# Patient Record
Sex: Male | Born: 1955 | Race: Black or African American | Hispanic: No | Smoking: Current every day smoker
Health system: Southern US, Community
[De-identification: ages and names within clinical notes are randomized; demographics above are authoritative.]

## PROBLEM LIST (undated history)

## (undated) DIAGNOSIS — R079 Chest pain, unspecified: Secondary | ICD-10-CM

## (undated) DIAGNOSIS — F1220 Cannabis dependence, uncomplicated: Secondary | ICD-10-CM

## (undated) DIAGNOSIS — E785 Hyperlipidemia, unspecified: Secondary | ICD-10-CM

## (undated) DIAGNOSIS — E7800 Pure hypercholesterolemia, unspecified: Secondary | ICD-10-CM

## (undated) DIAGNOSIS — K219 Gastro-esophageal reflux disease without esophagitis: Secondary | ICD-10-CM

## (undated) DIAGNOSIS — M5410 Radiculopathy, site unspecified: Secondary | ICD-10-CM

## (undated) DIAGNOSIS — E559 Vitamin D deficiency, unspecified: Secondary | ICD-10-CM

## (undated) DIAGNOSIS — I10 Essential (primary) hypertension: Secondary | ICD-10-CM

## (undated) DIAGNOSIS — F419 Anxiety disorder, unspecified: Secondary | ICD-10-CM

## (undated) DIAGNOSIS — G4730 Sleep apnea, unspecified: Secondary | ICD-10-CM

## (undated) DIAGNOSIS — I700 Atherosclerosis of aorta: Secondary | ICD-10-CM

## (undated) DIAGNOSIS — J449 Chronic obstructive pulmonary disease, unspecified: Secondary | ICD-10-CM

## (undated) DIAGNOSIS — G8929 Other chronic pain: Secondary | ICD-10-CM

## (undated) DIAGNOSIS — S86011D Strain of right Achilles tendon, subsequent encounter: Secondary | ICD-10-CM

## (undated) DIAGNOSIS — K5792 Diverticulitis of intestine, part unspecified, without perforation or abscess without bleeding: Secondary | ICD-10-CM

## (undated) DIAGNOSIS — F32A Depression, unspecified: Secondary | ICD-10-CM

## (undated) DIAGNOSIS — F329 Major depressive disorder, single episode, unspecified: Secondary | ICD-10-CM

## (undated) DIAGNOSIS — R7303 Prediabetes: Secondary | ICD-10-CM

## (undated) DIAGNOSIS — G5601 Carpal tunnel syndrome, right upper limb: Secondary | ICD-10-CM

## (undated) HISTORY — DX: Pure hypercholesterolemia, unspecified: E78.00

## (undated) HISTORY — DX: Atherosclerosis of aorta: I70.0

## (undated) HISTORY — DX: Anxiety disorder, unspecified: F41.9

## (undated) HISTORY — PX: EYE SURGERY: SHX253

## (undated) HISTORY — DX: Carpal tunnel syndrome, right upper limb: G56.01

## (undated) HISTORY — PX: CARPAL TUNNEL RELEASE: SHX101

## (undated) HISTORY — DX: Radiculopathy, site unspecified: M54.10

## (undated) HISTORY — DX: Sleep apnea, unspecified: G47.30

## (undated) HISTORY — DX: Chronic obstructive pulmonary disease, unspecified: J44.9

## (undated) HISTORY — PX: HERNIA REPAIR: SHX51

## (undated) HISTORY — DX: Vitamin D deficiency, unspecified: E55.9

## (undated) HISTORY — DX: Strain of right Achilles tendon, subsequent encounter: S86.011D

## (undated) HISTORY — DX: Cannabis dependence, uncomplicated: F12.20

## (undated) HISTORY — DX: Prediabetes: R73.03

## (undated) HISTORY — DX: Other chronic pain: G89.29

---

## 2005-05-08 ENCOUNTER — Ambulatory Visit: Payer: Self-pay | Admitting: Nurse Practitioner

## 2005-05-15 ENCOUNTER — Ambulatory Visit: Payer: Self-pay | Admitting: *Deleted

## 2005-05-20 ENCOUNTER — Ambulatory Visit: Payer: Self-pay | Admitting: Nurse Practitioner

## 2005-07-25 ENCOUNTER — Emergency Department (HOSPITAL_COMMUNITY): Admission: EM | Admit: 2005-07-25 | Discharge: 2005-07-25 | Payer: Self-pay | Admitting: Emergency Medicine

## 2006-01-29 ENCOUNTER — Encounter (INDEPENDENT_AMBULATORY_CARE_PROVIDER_SITE_OTHER): Payer: Self-pay | Admitting: *Deleted

## 2006-01-29 ENCOUNTER — Emergency Department (HOSPITAL_COMMUNITY): Admission: EM | Admit: 2006-01-29 | Discharge: 2006-01-29 | Payer: Self-pay | Admitting: Emergency Medicine

## 2007-03-02 ENCOUNTER — Ambulatory Visit: Payer: Self-pay | Admitting: Internal Medicine

## 2007-03-02 ENCOUNTER — Encounter (INDEPENDENT_AMBULATORY_CARE_PROVIDER_SITE_OTHER): Payer: Self-pay | Admitting: Family Medicine

## 2007-03-02 LAB — CONVERTED CEMR LAB
ALT: 10 units/L (ref 0–53)
BUN: 12 mg/dL (ref 6–23)
CO2: 27 meq/L (ref 19–32)
Calcium: 9.2 mg/dL (ref 8.4–10.5)
Chlamydia, Swab/Urine, PCR: NEGATIVE
Chloride: 108 meq/L (ref 96–112)
Cholesterol: 224 mg/dL — ABNORMAL HIGH (ref 0–200)
Creatinine, Ser: 0.94 mg/dL (ref 0.40–1.50)
GC Probe Amp, Urine: NEGATIVE
HDL: 53 mg/dL (ref >39)
Hemoglobin: 13.3 g/dL (ref 13.0–17.0)
Lymphocytes Relative: 40 % (ref 12–46)
Monocytes Absolute: 0.6 10*3/uL (ref 0.1–1.0)
Monocytes Relative: 8 % (ref 3–12)
Neutro Abs: 3.6 10*3/uL (ref 1.7–7.7)
RBC: 4.46 M/uL (ref 4.22–5.81)
Testosterone: 271.72 ng/dL — ABNORMAL LOW (ref 350–890)
Total CHOL/HDL Ratio: 4.2

## 2007-03-10 ENCOUNTER — Ambulatory Visit: Payer: Self-pay | Admitting: Family Medicine

## 2007-05-05 ENCOUNTER — Ambulatory Visit: Payer: Self-pay | Admitting: Internal Medicine

## 2007-05-05 ENCOUNTER — Encounter (INDEPENDENT_AMBULATORY_CARE_PROVIDER_SITE_OTHER): Payer: Self-pay | Admitting: Nurse Practitioner

## 2007-05-05 LAB — CONVERTED CEMR LAB
Albumin: 4.5 g/dL (ref 3.5–5.2)
Alkaline Phosphatase: 52 units/L (ref 39–117)
Glucose, Bld: 87 mg/dL (ref 70–99)
LDL Cholesterol: 163 mg/dL — ABNORMAL HIGH (ref 0–99)
Potassium: 4.5 meq/L (ref 3.5–5.3)
Sodium: 140 meq/L (ref 135–145)
Testosterone: 237.24 ng/dL — ABNORMAL LOW (ref 350–890)
Total Protein: 7.1 g/dL (ref 6.0–8.3)
Triglycerides: 94 mg/dL (ref <150)

## 2007-06-08 ENCOUNTER — Ambulatory Visit: Payer: Self-pay | Admitting: Internal Medicine

## 2007-07-10 ENCOUNTER — Ambulatory Visit: Payer: Self-pay | Admitting: Internal Medicine

## 2007-08-10 ENCOUNTER — Ambulatory Visit: Payer: Self-pay | Admitting: Internal Medicine

## 2007-08-17 ENCOUNTER — Ambulatory Visit: Payer: Self-pay | Admitting: Internal Medicine

## 2007-09-08 ENCOUNTER — Ambulatory Visit: Payer: Self-pay | Admitting: Internal Medicine

## 2007-10-08 ENCOUNTER — Emergency Department (HOSPITAL_COMMUNITY): Admission: EM | Admit: 2007-10-08 | Discharge: 2007-10-08 | Payer: Self-pay | Admitting: Emergency Medicine

## 2008-02-11 ENCOUNTER — Ambulatory Visit: Payer: Self-pay | Admitting: Internal Medicine

## 2008-02-11 ENCOUNTER — Encounter (INDEPENDENT_AMBULATORY_CARE_PROVIDER_SITE_OTHER): Payer: Self-pay | Admitting: Internal Medicine

## 2008-02-11 LAB — CONVERTED CEMR LAB
Chlamydia, Swab/Urine, PCR: NEGATIVE
GC Probe Amp, Urine: NEGATIVE
HCT: 43.7 % (ref 39.0–52.0)
Lymphocytes Relative: 42 % (ref 12–46)
Lymphs Abs: 3.7 10*3/uL (ref 0.7–4.0)
Neutro Abs: 4 10*3/uL (ref 1.7–7.7)
Neutrophils Relative %: 46 % (ref 43–77)
Platelets: 221 10*3/uL (ref 150–400)
WBC: 8.7 10*3/uL (ref 4.0–10.5)

## 2008-03-09 ENCOUNTER — Encounter (INDEPENDENT_AMBULATORY_CARE_PROVIDER_SITE_OTHER): Payer: Self-pay | Admitting: Internal Medicine

## 2008-03-09 ENCOUNTER — Ambulatory Visit: Payer: Self-pay | Admitting: Internal Medicine

## 2008-03-09 LAB — CONVERTED CEMR LAB
ALT: 10 units/L (ref 0–53)
Albumin: 4.7 g/dL (ref 3.5–5.2)
Bilirubin Urine: NEGATIVE
CO2: 24 meq/L (ref 19–32)
Chloride: 107 meq/L (ref 96–112)
Cholesterol: 254 mg/dL — ABNORMAL HIGH (ref 0–200)
Glucose, Bld: 92 mg/dL (ref 70–99)
Leukocytes, UA: NEGATIVE
Potassium: 4.6 meq/L (ref 3.5–5.3)
Sodium: 139 meq/L (ref 135–145)
Specific Gravity, Urine: 1.017 (ref 1.005–1.03)
Total Bilirubin: 0.4 mg/dL (ref 0.3–1.2)
Total Protein: 7.7 g/dL (ref 6.0–8.3)
Triglycerides: 82 mg/dL (ref <150)
Urine Glucose: NEGATIVE mg/dL
Urobilinogen, UA: 1 (ref 0.0–1.0)

## 2008-03-14 ENCOUNTER — Ambulatory Visit (HOSPITAL_COMMUNITY): Admission: RE | Admit: 2008-03-14 | Discharge: 2008-03-14 | Payer: Self-pay | Admitting: Family Medicine

## 2008-03-23 ENCOUNTER — Ambulatory Visit: Payer: Self-pay | Admitting: Internal Medicine

## 2008-04-11 ENCOUNTER — Ambulatory Visit: Payer: Self-pay | Admitting: Internal Medicine

## 2008-04-18 ENCOUNTER — Ambulatory Visit: Payer: Self-pay

## 2008-04-18 ENCOUNTER — Encounter: Payer: Self-pay | Admitting: Internal Medicine

## 2008-04-21 ENCOUNTER — Ambulatory Visit: Payer: Self-pay | Admitting: Cardiology

## 2008-04-28 ENCOUNTER — Ambulatory Visit: Payer: Self-pay

## 2008-05-03 ENCOUNTER — Ambulatory Visit: Payer: Self-pay | Admitting: Cardiology

## 2008-05-04 ENCOUNTER — Ambulatory Visit: Payer: Self-pay | Admitting: Internal Medicine

## 2008-06-01 ENCOUNTER — Ambulatory Visit: Payer: Self-pay | Admitting: Family Medicine

## 2009-03-16 ENCOUNTER — Encounter (INDEPENDENT_AMBULATORY_CARE_PROVIDER_SITE_OTHER): Payer: Self-pay | Admitting: *Deleted

## 2009-04-14 ENCOUNTER — Ambulatory Visit: Payer: Self-pay | Admitting: Internal Medicine

## 2009-05-05 ENCOUNTER — Ambulatory Visit: Payer: Self-pay | Admitting: Internal Medicine

## 2009-05-05 LAB — CONVERTED CEMR LAB
Calcium: 9.4 mg/dL (ref 8.4–10.5)
Glucose, Bld: 104 mg/dL — ABNORMAL HIGH (ref 70–99)
Potassium: 4.1 meq/L (ref 3.5–5.3)
Sodium: 142 meq/L (ref 135–145)

## 2009-05-12 ENCOUNTER — Ambulatory Visit: Payer: Self-pay | Admitting: Internal Medicine

## 2009-06-15 ENCOUNTER — Ambulatory Visit: Payer: Self-pay | Admitting: Internal Medicine

## 2009-06-15 LAB — CONVERTED CEMR LAB
ALT: 13 units/L (ref 0–53)
Alkaline Phosphatase: 62 units/L (ref 39–117)
Basophils Absolute: 0 10*3/uL (ref 0.0–0.1)
Basophils Relative: 0 % (ref 0–1)
HCT: 40.8 % (ref 39.0–52.0)
Hemoglobin: 12.7 g/dL — ABNORMAL LOW (ref 13.0–17.0)
Hgb A1c MFr Bld: 5.9 % (ref 4.6–6.1)
LDL Cholesterol: 112 mg/dL — ABNORMAL HIGH (ref 0–99)
MCV: 90.3 fL (ref 78.0–100.0)
Neutro Abs: 3.5 10*3/uL (ref 1.7–7.7)
Neutrophils Relative %: 48 % (ref 43–77)
Platelets: 178 10*3/uL (ref 150–400)
Sodium: 143 meq/L (ref 135–145)
Total Bilirubin: 0.3 mg/dL (ref 0.3–1.2)
Total Protein: 6.5 g/dL (ref 6.0–8.3)
VLDL: 12 mg/dL (ref 0–40)
Valproic Acid Lvl: 108.9 ug/mL — ABNORMAL HIGH (ref 50.0–100.0)
WBC: 7.4 10*3/uL (ref 4.0–10.5)

## 2009-06-21 ENCOUNTER — Ambulatory Visit: Payer: Self-pay | Admitting: Internal Medicine

## 2009-08-15 ENCOUNTER — Ambulatory Visit: Payer: Self-pay | Admitting: Internal Medicine

## 2009-08-22 ENCOUNTER — Ambulatory Visit: Payer: Self-pay | Admitting: Internal Medicine

## 2009-09-06 ENCOUNTER — Ambulatory Visit: Payer: Self-pay | Admitting: Internal Medicine

## 2009-09-20 ENCOUNTER — Ambulatory Visit: Payer: Self-pay | Admitting: Internal Medicine

## 2009-10-04 ENCOUNTER — Ambulatory Visit: Payer: Self-pay | Admitting: Internal Medicine

## 2009-10-30 ENCOUNTER — Ambulatory Visit: Payer: Self-pay | Admitting: Internal Medicine

## 2010-02-23 ENCOUNTER — Emergency Department (HOSPITAL_COMMUNITY)
Admission: EM | Admit: 2010-02-23 | Discharge: 2010-02-24 | Payer: Self-pay | Source: Home / Self Care | Admitting: Emergency Medicine

## 2010-03-28 ENCOUNTER — Emergency Department (HOSPITAL_COMMUNITY)
Admission: EM | Admit: 2010-03-28 | Discharge: 2010-03-29 | Disposition: A | Discharge status: Other Acute Inpatient Hospital | Payer: Self-pay | Source: Home / Self Care | Admitting: Emergency Medicine

## 2010-03-29 ENCOUNTER — Inpatient Hospital Stay (HOSPITAL_COMMUNITY)
Admission: RE | Admit: 2010-03-29 | Discharge: 2010-04-05 | Payer: Self-pay | Source: Home / Self Care | Attending: Psychiatry | Admitting: Psychiatry
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
## 2010-04-01 ENCOUNTER — Encounter: Payer: Self-pay | Admitting: Internal Medicine

## 2010-04-02 LAB — DIFFERENTIAL
Lymphocytes Relative: 28 % (ref 12–46)
Monocytes Relative: 9 % (ref 3–12)

## 2010-04-02 LAB — CBC
Hemoglobin: 15.8 g/dL (ref 13.0–17.0)
MCH: 29.7 pg (ref 26.0–34.0)
Platelets: 219 10*3/uL (ref 150–400)
RBC: 5.32 MIL/uL (ref 4.22–5.81)
WBC: 9.8 10*3/uL (ref 4.0–10.5)

## 2010-04-02 LAB — POCT I-STAT, CHEM 8
BUN: 15 mg/dL (ref 6–23)
Calcium, Ion: 1.15 mmol/L (ref 1.12–1.32)
Creatinine, Ser: 1.1 mg/dL (ref 0.4–1.5)
TCO2: 34 mmol/L (ref 0–100)

## 2010-04-02 LAB — BASIC METABOLIC PANEL
BUN: 13 mg/dL (ref 6–23)
Creatinine, Ser: 1.18 mg/dL (ref 0.4–1.5)
GFR calc non Af Amer: >60 mL/min (ref >60)
Glucose, Bld: 95 mg/dL (ref 70–99)
Potassium: 2.8 mEq/L — ABNORMAL LOW (ref 3.5–5.1)

## 2010-04-02 LAB — RAPID URINE DRUG SCREEN, HOSP PERFORMED
Amphetamines: NOT DETECTED
Barbiturates: NOT DETECTED
Benzodiazepines: NOT DETECTED
Cocaine: NOT DETECTED
Opiates: NOT DETECTED
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
## 2010-04-11 NOTE — Letter (Signed)
Summary: Appointment - Reminder 2  [employer], Main Office  [address]   [place], [place] [address]   Phone: [phone]  Fax: [phone]     March 16, 2009 MRN: [id number]   MADELINE BEBOUT [address] [place], [place]  [address]   Dear Mr. Calzadilla,  Our records indicate that it is time to schedule a follow-up appointment with Dr. Shirlee Latch. It is very important that we reach you to schedule this appointment. We look forward to participating in your health care needs. Please contact us at the number listed above at your earliest convenience to schedule your appointment.  If you are unable to make an appointment at this time, give [place] a call so we can update our records.  Sincerely,   Migdalia Dk [facility] Scheduling Team

## 2010-05-10 ENCOUNTER — Emergency Department (HOSPITAL_COMMUNITY)
Admission: EM | Admit: 2010-05-10 | Discharge: 2010-05-10 | Disposition: A | Discharge status: Home / Self Care | Payer: Medicare Other | Attending: Emergency Medicine | Admitting: Emergency Medicine

## 2010-05-10 DIAGNOSIS — F319 Bipolar disorder, unspecified: Secondary | ICD-10-CM | POA: Insufficient documentation

## 2010-05-10 DIAGNOSIS — M542 Cervicalgia: Secondary | ICD-10-CM | POA: Insufficient documentation

## 2010-05-10 DIAGNOSIS — I10 Essential (primary) hypertension: Secondary | ICD-10-CM | POA: Insufficient documentation

## 2010-05-10 DIAGNOSIS — G8929 Other chronic pain: Secondary | ICD-10-CM | POA: Insufficient documentation

## 2010-05-10 DIAGNOSIS — E7800 Pure hypercholesterolemia, unspecified: Secondary | ICD-10-CM | POA: Insufficient documentation

## 2010-07-22 ENCOUNTER — Emergency Department (HOSPITAL_COMMUNITY): Payer: Medicare Other

## 2010-07-22 ENCOUNTER — Emergency Department (HOSPITAL_COMMUNITY)
Admission: EM | Admit: 2010-07-22 | Discharge: 2010-07-22 | Disposition: A | Discharge status: Home / Self Care | Payer: Medicare Other | Attending: Emergency Medicine | Admitting: Emergency Medicine

## 2010-07-22 DIAGNOSIS — R112 Nausea with vomiting, unspecified: Secondary | ICD-10-CM | POA: Insufficient documentation

## 2010-07-22 DIAGNOSIS — R197 Diarrhea, unspecified: Secondary | ICD-10-CM | POA: Insufficient documentation

## 2010-07-22 DIAGNOSIS — I10 Essential (primary) hypertension: Secondary | ICD-10-CM | POA: Insufficient documentation

## 2010-07-22 DIAGNOSIS — R109 Unspecified abdominal pain: Secondary | ICD-10-CM | POA: Insufficient documentation

## 2010-07-22 DIAGNOSIS — E7800 Pure hypercholesterolemia, unspecified: Secondary | ICD-10-CM | POA: Insufficient documentation

## 2010-07-22 DIAGNOSIS — F319 Bipolar disorder, unspecified: Secondary | ICD-10-CM | POA: Insufficient documentation

## 2010-07-22 LAB — COMPREHENSIVE METABOLIC PANEL
ALT: 9 U/L (ref 0–53)
AST: 13 U/L (ref 0–37)
Alkaline Phosphatase: 56 U/L (ref 39–117)
CO2: 29 mEq/L (ref 19–32)
Chloride: 100 mEq/L (ref 96–112)
Creatinine, Ser: 1.02 mg/dL (ref 0.4–1.5)
GFR calc Af Amer: >60 mL/min (ref >60)
GFR calc non Af Amer: >60 mL/min (ref >60)
Potassium: 3.4 mEq/L — ABNORMAL LOW (ref 3.5–5.1)
Total Bilirubin: 0.3 mg/dL (ref 0.3–1.2)

## 2010-07-22 LAB — DIFFERENTIAL
Basophils Relative: 0 % (ref 0–1)
Lymphs Abs: 2.5 10*3/uL (ref 0.7–4.0)
Monocytes Relative: 10 % (ref 3–12)
Neutro Abs: 5.5 10*3/uL (ref 1.7–7.7)
Neutrophils Relative %: 61 % (ref 43–77)

## 2010-07-22 LAB — CBC
Hemoglobin: 15 g/dL (ref 13.0–17.0)
MCH: 29.6 pg (ref 26.0–34.0)
RBC: 5.07 MIL/uL (ref 4.22–5.81)
WBC: 9 10*3/uL (ref 4.0–10.5)

## 2010-07-24 NOTE — Op Note (Signed)
NAME:  Paul Brady, ZIOMEK NO.:  [id number]   MEDICAL RECORD NO.:  [id number]          PATIENT TYPE:  EMS   LOCATION:  ED                           FACILITY:  [facility]   PHYSICIAN:  Lucrezia Starch. Earlene Plater, M.D.  DATE OF BIRTH:  1955/09/10   DATE OF PROCEDURE:  10/08/2007  DATE OF DISCHARGE:                               OPERATIVE REPORT   REPORT TITLE:  OPERATIVE REPORT/CONSULT   HISTORY OF PRESENT ILLNESS:  Ms. Utz is a very nice 55 year old black  male who presents with an 8-hour history of persistent erection.  He has  been quite healthy, but he takes trazodone to sleep and last night he  took 2 trazodone tablets.  He woke up this morning with an erection and  has the erection being very firm, painful and persistent.  He has been  able to urinate, but it has persisted despite using ice packs and then  eventually IV terbutaline.  He comes in for evaluation and treatment.   PAST MEDICAL HISTORY:  He has been quite healthy.  He does have anxiety.   MEDICATIONS:  1. Trazodone 100 mg at bedtime, which he doubled last night.  2. Zoloft 100 mg.  3. Depakote 1500 mg once a day.   ALLERGIES:  HE HAS NO KNOWN ALLERGIES.   PAST SURGICAL HISTORY:  He has had no significant surgeries.   SOCIAL HISTORY:  He is a negative drinker, but he is a smoker.  Of note,  he does have bipolar disorder.   PHYSICAL EXAMINATION:  VITAL SIGNS:  He is afebrile, vital signs stable.  GENERAL:  Well-nourished, well-developed in acute distress.  Oriented  x3.  HEAD/EYES/EARS/NOSE/THROAT:  Normal.  NECK:  Without mass or thyromegaly.  CHEST:  Has normal diaphragmatic motion.  HEART:  Tachycardia without murmurs or gallops.  ABDOMEN:  Soft, nontender without masses, organomegaly or hernias.  EXTREMITIES:  Normal.  NEURO:  Intact.  SKIN:  Normal.  PENIS:  Penis is very firm and erect.  Scrotum, testicles, adnexa, anus  and perineum were normal.   PROCEDURE:  The penis was prepped with Betadine  in a sterile fashion and  a 19 gauge butterfly was placed.  Approximately 40 mL of old frank  shaft appearing bloody material was removed and 1 mL of 100 mcg/mL  phenylephrine was irrigated and flushed with normal saline.  Again,  approximately 30 mL of frank shaft type bloody material was aspirated  and a second, third, fourth and fifth cycle of phenylephrine with saline  flush was performed.  The erection appeared to go down.  He  physiologically was stable.  Pressure was held on the puncture wound  after  the butterfly had been removed, and the wound was dressed with a 4x4  gauze over the puncture site and a Coban mild pressure dressing.  We  will observe him for an hour in the emergency room.  If he is down, he  will go home.  Specific instructions were given.  He is to follow-up  with [place] in a week, and call if he has problems in the interim.  Ronald L. Earlene Plater, M.D.  Electronically Signed     RLD/MEDQ  D:  10/08/2007  T:  10/08/2007  Job:  [id number]

## 2010-07-24 NOTE — Assessment & Plan Note (Signed)
[facility]                            CARDIOLOGY OFFICE NOTE   Paul Brady, Paul Brady                       MRN:          [id number]  DATE:05/03/2008                            DOB:          October 13, 1955    PRIMARY CARE PHYSICIAN:  Dineen Kid. Reche Dixon, MD, at [facility].   HISTORY OF PRESENT ILLNESS:  This is a 55 year old with a history of  hyperlipidemia, hypertension, smoking, and bipolar disorder, who comes  back to Cardiology Clinic after a evaluation for atypical-type chest  pain.  The patient did have an exercise treadmill Myoview.  He stopped  his exercise due to leg pain.  There was no chest pain.  His EF was 54%.  There was normal perfusion on the Myoview scan.  His study actually was  submaximal.  He did only reach 77% of his maximum-predicted heart rate.  He did exercise for 7 minutes.  The patient states that he has actually  been doing fine lately.  He continues to have somewhat atypical chest  pain and is more of lower chest and upper abdominal-type pain.  He  thinks it feels more like gas.  He does suspect it may be GI in nature.   PAST MEDICAL HISTORY:  1. Bipolar disorder.  2. History of priapism.  3. Hyperlipidemia.  4. Hypertension.  5. Smoking.  The patient has now cut down about 3 cigarettes a day.  6. Echocardiogram done in February 2010, EF 55-60%.  No regional wall      motion abnormality.  No LVH.  There is mild focal basal septal      hypertrophy, RV appeared normal, and no significant valvular      abnormalities.  7. Exercise treadmill Myoview in February 2010.  Exercise was stopped      due to pain in his legs, and there is no chest pain.  He exercises      7 minutes.  He is 77% of maximal-predicted heart rate.  His EF was      54%.  There was normal perfusion stress and rest.  However, this is      a submaximal study.   MEDICATIONS:  1. Depakote ER 1500 mg daily.  2. Wellbutrin.  3. Lipitor 20 mg daily.  4. Protonix 40  mg daily.  5. Hydrochlorothiazide 25 mg daily.  6. Seroquel.  7. Zoloft.   LABORATORY STUDIES:  Most recent labs from December 2009, LDL 77, HDL  61, and creatinine 0.8.   SOCIAL HISTORY:  The patient now smokes about 3 cigarettes a day.  He  was a heavier smoker in the past.  He lives alone, unemployed.   FAMILY HISTORY:  The patient's mother had 2 heart attacks in her 85s.  He had a brother who had a heart attack in his mid 33s.   PHYSICAL EXAMINATION:  VITAL SIGNS:  Blood pressure is 118/80, heart  rate is 80 and regular.  GENERAL:  This is a well-developed male in no apparent stress.  LUNGS:  Clear to auscultation bilaterally.  Breath sounds are somewhat  distant.  NEUROLOGIC:  Alert  and oriented x3.  Normal affect.  NECK:  There is no JVD.  There is no thyromegaly or thyroid nodule.  CARDIOVASCULAR:  Heart regular S1 and S2.  There is a soft S4.  There is  no murmur.  EXTREMITIES:  There is no peripheral edema.  There are 2+ posterior  tibial pulses bilaterally and 2+ dorsalis pedis pulses bilaterally.  ABDOMEN:  Soft and nontender.  No hepatosplenomegaly.  Normal bowel  sounds.   ASSESSMENT AND PLAN:  This is a 55 year old with history of  hypertension, hyperlipidemia, and smoking, who presents to Cardiology  Clinic for evaluation of atypical chest pain.  1. Chest pain.  The patient's chest pain is atypical and he now states      that this seems like it is more of a gas-type pain, often happens      after he has eaten.  It is in his upper abdomen.  It is tending to      ease off compared to back earlier this month.  He had a treadmill      Myoview with no evidence for ischemia; however, it was a submaximal      test.  Echocardiogram was relatively normal as well.  At this time,      I think the plan will be to continue aggressive management of his      risk factors.  He does need to quit smoking.  He is cutting back,      which I congratulated him on.  His LDL was 177 and  he is on Lipitor      20.  I do think he is going to need more statin to get the LDL      down.  I would like him probably to be down below 100 given all his      risk factors and therefore, I am going to increase his Lipitor to      40 mg daily today and he will need to get lipids and LFTs done in 3      months.  He also should continue on his aspirin 81 mg daily.  2. Hypertension.  The patient's blood pressure is under good control      on the current dose of hydrochlorothiazide.  3. Hyperlipidemia.  As mentioned, we are going to increase his Lipitor      to 40 mg daily.  He will get his cholesterol checked in 3 months.  4. Smoking.  I once again encouraged him to stop the smoking      completely.  He is making progress.  He has cut down.  He is on      Wellbutrin.  5. I will see the patient in followup in 1 year.      Marca Ancona, MD  Electronically Signed    DM/MedQ  DD: 05/03/2008  DT: 05/04/2008  Job #: [id number]   cc:   Dineen Kid. Reche Dixon, M.D.

## 2010-07-24 NOTE — Assessment & Plan Note (Signed)
[facility]                            CARDIOLOGY OFFICE NOTE   Brady, Paul                       MRN:          [id number]  DATE:04/21/2008                            DOB:          [birth year]    PRIMARY CARE PHYSICIAN:  Dineen Kid. Reche Dixon, MD, at [facility].   HISTORY OF PRESENT ILLNESS:  This is a 55 year old with history of  hyperlipidemia, hypertension, smoking and bipolar disorder who presents  to Cardiology Clinic for evaluation of abnormal EKG and chest  discomfort.  The patient states that for the last approximately 2  months, he has been having episodes of central chest pressure.  He says  the chest pressure tends to be quite significant.  It will come on and  last for about 5 minutes, then will resolve spontaneously.  He gets the  pressure both at rest and with exertion.  It occurs up to 3 times a day.  He says he sometimes notices it when he is walking.  He says the pain  will tend to resolve without him actually stopping.  The patient has had  no prolonged episode lasting longer than 5 minutes.  Otherwise, the  patient describes shortness of breath after climbing two flights of  stairs or after walking up an incline.  He does okay on flat ground.  He  does continue to smoke about half-a-pack a day.   PAST MEDICAL HISTORY:  1. Bipolar disorder.  2. Prior episode of priapism.  3. Hyperlipidemia.  4. Hypertension.  5. Smoking.  The patient does smoke half-a-pack a day.  6. Echocardiogram done in February 2010.  This showed EF of 55-60%.      No regional wall motion abnormalities.  No LVH.  There is mild      focal basal septal hypertrophy.  The RV appeared normal.  There are      no significant valvular abnormalities.   MEDICATIONS:  1. Depakote ER 1500 mg daily.  2. Wellbutrin.  3. Lipitor 20 mg daily.  4. Protonix 40 mg daily.  5. Hydrochlorothiazide 25 mg daily.  6. Seroquel.  7. Zoloft.   EKG was reviewed and shows  normal sinus rhythm with repolarization  changes that may be consistent with early repolarization.  There is  borderline LVH.  Normal QRS duration.   SOCIAL HISTORY:  The patient smokes half-a-pack a day.  He is a heavy  smoker in the past.  He lives alone.  He is unemployed.   FAMILY HISTORY:  The patient's mother had heart problems.  He says he  think she had 2 heart attacks in her 75s.  He additionally has a  brother, who had a heart attack in his mid 60s.   REVIEW OF SYSTEMS:  Negative except as described in the history of  present illness.   PHYSICAL EXAMINATION:  VITAL SIGNS:  Blood pressure 124/96, heart rate  is 65 and regular, and weight is 244 pounds.  GENERAL:  This is a well-developed male in no apparent distress.  NEUROLOGIC:  Alert and oriented x3.  Normal affect.  LUNGS:  Clear  to auscultation bilaterally.  Breath sounds are distant.  NECK:  There is no JVD.  There is no thyromegaly or thyroid nodule.  CARDIOVASCULAR:  Heart regular.  S1 and S2.  There is an S4.  There is  no murmur.  ABDOMEN:  Soft and nontender.  No hepatosplenomegaly.  Normal bowel  sounds.  EXTREMITIES:  No clubbing or cyanosis.  There is no peripheral edema.  There are 2+ posterior tibial and dorsalis pedis pulses bilaterally.  There is no carotid bruit.  SKIN:  Normal exam.  HEENT:  Normal exam.  MUSCULOSKELETAL:  Normal exam.   ASSESSMENT AND PLAN:  This is a 55 year old with a history of  hypertension, hyperlipidemia, and smoking who presents to Cardiology  Clinic for evaluation of abnormal EKG and chest pain.  1. Chest pain.  The patient's chest pain pattern is atypical.  It does      sometimes occur with exertion, but it also sometimes occurs at      rest.  It is central and substernal.  He does have multiple risk      factors for coronary artery disease including hyperlipidemia,      smoking, and hypertension.  He additionally does have a family      history of coronary artery disease.   His EKG showed some early      repolarization and borderline LVH.  Although his pain is atypical,      he does have a number of risk factors and I do think that it would      be safest to do a stress test.  Unfortunately, as his baseline EKG      has repolarization abnormalities, I do not think that an exercise      treadmill test alone is the best option.  I do think that we      probably should do a treadmill Myoview.  The patient would have to      pay out of pocket for this and does not have the funds, so we are      going to explore avenues to help subsidize this for him.  He also      should start aspirin 81 mg daily.  2. Hypertension.  The patient's blood pressure is 124/96 today and the      lower number is a little bit elevated.  He is on      hydrochlorothiazide 25 mg daily.  Continue him on this dose.  If it      continues to be elevated next time I will see him, I will probably      add an additional medication.  3. Hyperlipidemia.  The patient is on Lipitor.  We will get his      cholesterol results from [facility].  4. Smoking.  I did strongly encouraged the patient to stop smoking.      He is on Wellbutrin currently.     Marca Ancona, MD  Electronically Signed   DM/MedQ  DD: 04/21/2008  DT: 04/21/2008  Job #: [id number]   cc:   Dineen Kid. Reche Dixon, M.D.

## 2012-01-10 ENCOUNTER — Emergency Department (HOSPITAL_COMMUNITY)
Admission: EM | Admit: 2012-01-10 | Discharge: 2012-01-10 | Disposition: A | Discharge status: Home / Self Care | Payer: Medicare Other | Attending: Emergency Medicine | Admitting: Emergency Medicine

## 2012-01-10 ENCOUNTER — Emergency Department (HOSPITAL_COMMUNITY): Payer: Medicare Other

## 2012-01-10 ENCOUNTER — Encounter (HOSPITAL_COMMUNITY): Payer: Self-pay | Admitting: Family Medicine

## 2012-01-10 DIAGNOSIS — S6990XA Unspecified injury of unspecified wrist, hand and finger(s), initial encounter: Secondary | ICD-10-CM | POA: Insufficient documentation

## 2012-01-10 DIAGNOSIS — F329 Major depressive disorder, single episode, unspecified: Secondary | ICD-10-CM | POA: Insufficient documentation

## 2012-01-10 DIAGNOSIS — T07XXXA Unspecified multiple injuries, initial encounter: Secondary | ICD-10-CM | POA: Insufficient documentation

## 2012-01-10 DIAGNOSIS — Z79899 Other long term (current) drug therapy: Secondary | ICD-10-CM | POA: Insufficient documentation

## 2012-01-10 DIAGNOSIS — F17200 Nicotine dependence, unspecified, uncomplicated: Secondary | ICD-10-CM | POA: Insufficient documentation

## 2012-01-10 DIAGNOSIS — F3289 Other specified depressive episodes: Secondary | ICD-10-CM | POA: Insufficient documentation

## 2012-01-10 HISTORY — DX: Depression, unspecified: F32.A

## 2012-01-10 HISTORY — DX: Major depressive disorder, single episode, unspecified: F32.9

## 2012-01-10 MED ORDER — OXYCODONE-ACETAMINOPHEN 5-325 MG PO TABS: 1.0000 | ORAL_TABLET | ORAL | Status: DC | PRN

## 2012-01-10 MED ORDER — OXYCODONE-ACETAMINOPHEN 5-325 MG PO TABS
2.0000 | ORAL_TABLET | Freq: Once | ORAL | Status: AC
  Administered 2012-01-10: 2 via ORAL
  Filled 2012-01-10: qty 2

## 2012-01-10 NOTE — ED Notes (Signed)
[employer] notified per pt. Request.
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
## 2012-02-03 ENCOUNTER — Ambulatory Visit: Payer: Medicare Other | Admitting: Physical Therapy

## 2012-02-11 ENCOUNTER — Ambulatory Visit: Payer: Medicare Other | Attending: Internal Medicine

## 2012-02-11 DIAGNOSIS — IMO0001 Reserved for inherently not codable concepts without codable children: Secondary | ICD-10-CM | POA: Insufficient documentation

## 2012-02-11 DIAGNOSIS — M545 Low back pain, unspecified: Secondary | ICD-10-CM | POA: Insufficient documentation

## 2012-02-11 DIAGNOSIS — M25569 Pain in unspecified knee: Secondary | ICD-10-CM | POA: Insufficient documentation

## 2012-02-17 ENCOUNTER — Encounter: Payer: Medicare Other | Admitting: Rehabilitation

## 2012-02-19 ENCOUNTER — Encounter: Payer: Medicare Other | Admitting: Rehabilitation

## 2012-02-24 ENCOUNTER — Ambulatory Visit: Payer: Medicare Other | Admitting: Rehabilitation

## 2012-02-26 ENCOUNTER — Ambulatory Visit: Payer: Medicare Other | Admitting: Physical Therapy

## 2012-03-10 ENCOUNTER — Encounter: Payer: Medicare Other | Admitting: Physical Therapy

## 2012-04-11 HISTORY — PX: WRIST FUSION: SHX839

## 2012-08-04 ENCOUNTER — Emergency Department (HOSPITAL_COMMUNITY): Payer: Medicare Other

## 2012-08-04 ENCOUNTER — Encounter (HOSPITAL_COMMUNITY): Payer: Self-pay | Admitting: General Practice

## 2012-08-04 ENCOUNTER — Observation Stay (HOSPITAL_COMMUNITY)
Admission: EM | Admit: 2012-08-04 | Discharge: 2012-08-05 | Disposition: A | Discharge status: Home / Self Care | Payer: Medicare Other | Attending: Family Medicine | Admitting: Family Medicine

## 2012-08-04 DIAGNOSIS — E785 Hyperlipidemia, unspecified: Secondary | ICD-10-CM | POA: Diagnosis present

## 2012-08-04 DIAGNOSIS — Z79899 Other long term (current) drug therapy: Secondary | ICD-10-CM | POA: Insufficient documentation

## 2012-08-04 DIAGNOSIS — R0602 Shortness of breath: Secondary | ICD-10-CM | POA: Insufficient documentation

## 2012-08-04 DIAGNOSIS — Z23 Encounter for immunization: Secondary | ICD-10-CM | POA: Insufficient documentation

## 2012-08-04 DIAGNOSIS — F17200 Nicotine dependence, unspecified, uncomplicated: Secondary | ICD-10-CM | POA: Insufficient documentation

## 2012-08-04 DIAGNOSIS — R079 Chest pain, unspecified: Principal | ICD-10-CM | POA: Diagnosis present

## 2012-08-04 DIAGNOSIS — Z8249 Family history of ischemic heart disease and other diseases of the circulatory system: Secondary | ICD-10-CM | POA: Insufficient documentation

## 2012-08-04 DIAGNOSIS — I10 Essential (primary) hypertension: Secondary | ICD-10-CM | POA: Diagnosis present

## 2012-08-04 DIAGNOSIS — Z720 Tobacco use: Secondary | ICD-10-CM | POA: Diagnosis present

## 2012-08-04 HISTORY — DX: Gastro-esophageal reflux disease without esophagitis: K21.9

## 2012-08-04 HISTORY — DX: Essential (primary) hypertension: I10

## 2012-08-04 HISTORY — DX: Chest pain, unspecified: R07.9

## 2012-08-04 HISTORY — DX: Hyperlipidemia, unspecified: E78.5

## 2012-08-04 LAB — COMPREHENSIVE METABOLIC PANEL
ALT: 11 U/L (ref 0–53)
Albumin: 4.2 g/dL (ref 3.5–5.2)
Alkaline Phosphatase: 78 U/L (ref 39–117)
Calcium: 9.5 mg/dL (ref 8.4–10.5)
Potassium: 3.8 mEq/L (ref 3.5–5.1)
Sodium: 139 mEq/L (ref 135–145)
Total Protein: 7.1 g/dL (ref 6.0–8.3)

## 2012-08-04 LAB — CBC WITH DIFFERENTIAL/PLATELET
Basophils Relative: 0 % (ref 0–1)
Eosinophils Absolute: 0.1 10*3/uL (ref 0.0–0.7)
Eosinophils Relative: 1 % (ref 0–5)
MCH: 29.4 pg (ref 26.0–34.0)
MCHC: 35.5 g/dL (ref 30.0–36.0)
MCV: 82.8 fL (ref 78.0–100.0)
Neutrophils Relative %: 65 % (ref 43–77)
Platelets: 216 10*3/uL (ref 150–400)
RDW: 14.2 % (ref 11.5–15.5)

## 2012-08-04 LAB — CBC
HCT: 41.4 % (ref 39.0–52.0)
Hemoglobin: 14.6 g/dL (ref 13.0–17.0)
MCH: 29.2 pg (ref 26.0–34.0)
MCHC: 35.3 g/dL (ref 30.0–36.0)

## 2012-08-04 LAB — HEMOGLOBIN A1C
Hgb A1c MFr Bld: 5.3 % (ref <5.7)
Mean Plasma Glucose: 105 mg/dL (ref <117)

## 2012-08-04 MED ORDER — ENOXAPARIN SODIUM 40 MG/0.4ML ~~LOC~~ SOLN
40.0000 mg | SUBCUTANEOUS | Status: DC
  Administered 2012-08-04: 40 mg via SUBCUTANEOUS
  Filled 2012-08-04 (×2): qty 0.4

## 2012-08-04 MED ORDER — ASPIRIN EC 81 MG PO TBEC
81.0000 mg | DELAYED_RELEASE_TABLET | Freq: Every day | ORAL | Status: DC
  Administered 2012-08-05: 81 mg via ORAL
  Filled 2012-08-04 (×2): qty 1

## 2012-08-04 MED ORDER — METOPROLOL TARTRATE 12.5 MG HALF TABLET
12.5000 mg | ORAL_TABLET | Freq: Two times a day (BID) | ORAL | Status: DC
  Administered 2012-08-04 – 2012-08-05 (×2): 12.5 mg via ORAL
  Filled 2012-08-04 (×3): qty 1

## 2012-08-04 MED ORDER — SODIUM CHLORIDE 0.9 % IJ SOLN
3.0000 mL | Freq: Two times a day (BID) | INTRAMUSCULAR | Status: DC
  Administered 2012-08-04: 3 mL via INTRAVENOUS

## 2012-08-04 MED ORDER — ONDANSETRON HCL 4 MG/2ML IJ SOLN: 4.0000 mg | Freq: Four times a day (QID) | INTRAMUSCULAR | Status: DC | PRN

## 2012-08-04 MED ORDER — NICOTINE 21 MG/24HR TD PT24
21.0000 mg | MEDICATED_PATCH | Freq: Once | TRANSDERMAL | Status: DC
  Administered 2012-08-04: 21 mg via TRANSDERMAL
  Filled 2012-08-04: qty 1

## 2012-08-04 MED ORDER — SENNOSIDES-DOCUSATE SODIUM 8.6-50 MG PO TABS
1.0000 | ORAL_TABLET | Freq: Every evening | ORAL | Status: DC | PRN
  Filled 2012-08-04: qty 1

## 2012-08-04 MED ORDER — LISINOPRIL 5 MG PO TABS
5.0000 mg | ORAL_TABLET | Freq: Every day | ORAL | Status: DC
  Administered 2012-08-04 – 2012-08-05 (×2): 5 mg via ORAL
  Filled 2012-08-04 (×2): qty 1

## 2012-08-04 MED ORDER — ZOLPIDEM TARTRATE 5 MG PO TABS
5.0000 mg | ORAL_TABLET | Freq: Once | ORAL | Status: AC
  Administered 2012-08-04: 5 mg via ORAL
  Filled 2012-08-04: qty 1

## 2012-08-04 MED ORDER — ACETAMINOPHEN 650 MG RE SUPP: 650.0000 mg | Freq: Four times a day (QID) | RECTAL | Status: DC | PRN

## 2012-08-04 MED ORDER — OXYCODONE HCL 5 MG PO TABS: 5.0000 mg | ORAL_TABLET | ORAL | Status: DC | PRN

## 2012-08-04 MED ORDER — ACETAMINOPHEN 325 MG PO TABS
650.0000 mg | ORAL_TABLET | Freq: Four times a day (QID) | ORAL | Status: DC | PRN
  Administered 2012-08-04: 650 mg via ORAL
  Filled 2012-08-04: qty 2

## 2012-08-04 MED ORDER — HYDROMORPHONE HCL PF 1 MG/ML IJ SOLN: 1.0000 mg | INTRAMUSCULAR | Status: DC | PRN

## 2012-08-04 MED ORDER — PNEUMOCOCCAL VAC POLYVALENT 25 MCG/0.5ML IJ INJ
0.5000 mL | INJECTION | INTRAMUSCULAR | Status: AC
  Administered 2012-08-05: 0.5 mL via INTRAMUSCULAR
  Filled 2012-08-04: qty 0.5

## 2012-08-04 MED ORDER — ONDANSETRON HCL 4 MG PO TABS: 4.0000 mg | ORAL_TABLET | Freq: Four times a day (QID) | ORAL | Status: DC | PRN

## 2012-08-04 MED ORDER — ONDANSETRON HCL 4 MG/2ML IJ SOLN: 4.0000 mg | Freq: Three times a day (TID) | INTRAMUSCULAR | Status: DC | PRN

## 2012-08-04 NOTE — ED Notes (Signed)
Chest pain began last night about 9 PM. States that it felt like heart burn and was intermittent. Today the pain became sharp with a 15 to 30 sec. Duration. Pain was non radiating.

## 2012-08-04 NOTE — ED Provider Notes (Signed)
History     CSN: [id number]  Arrival date & time 08/04/12  1244   First MD Initiated Contact with Patient 08/04/12 1251      Chief Complaint  Patient presents with  . Chest Pain    (Consider location/radiation/quality/duration/timing/severity/associated sxs/prior treatment) HPI Comments: Patient with no cardiac history presents with complaints of chest discomfort intermittently since last night.  He reports multiple episodes of pressure-like discomfort in the center of his chest that causes him to feel short of breath.  It seems to happen with both exertion and rest.    He does have the risk factors of HTN, Cholesterol, CAD in both parents, and smoking.    Patient is a 57 y.o. male presenting with chest pain. The history is provided by the patient.  Chest Pain Pain location:  Substernal area Pain quality: burning and pressure   Pain radiates to:  Does not radiate Pain radiates to the back: no   Pain severity:  Moderate Onset quality:  Sudden Duration:  12 hours Timing:  Intermittent Progression:  Worsening Chronicity:  New Context: at rest   Relieved by:  Nothing Worsened by:  Nothing tried Ineffective treatments:  None tried Associated symptoms: no abdominal pain, no fever and no palpitations     Past Medical History  Diagnosis Date  . Depression     No past surgical history on file.  No family history on file.  History  Substance Use Topics  . Smoking status: Current Every Day Smoker  . Smokeless tobacco: Not on file  . Alcohol Use: No      Review of Systems  Constitutional: Negative for fever.  Cardiovascular: Positive for chest pain. Negative for palpitations.  Gastrointestinal: Negative for abdominal pain.  All other systems reviewed and are negative.    Allergies  Review of patient's allergies indicates no known allergies.  Home Medications   Current Outpatient Rx  Name  Route  Sig  Dispense  Refill  . GOLDEN SEAL PO   Oral   Take 1  tablet by mouth daily.           BP 169/102  Pulse 79  Temp(Src) 97.9 F (36.6 C) (Oral)  Resp 18  SpO2 100%  Physical Exam  Nursing note and vitals reviewed. Constitutional: He is oriented to person, place, and time. He appears well-developed and well-nourished. No distress.  HENT:  Head: Normocephalic and atraumatic.  Mouth/Throat: Oropharynx is clear and moist.  Neck: Normal range of motion. Neck supple.  Cardiovascular: Normal rate, regular rhythm and normal heart sounds.   No murmur heard. Pulmonary/Chest: Effort normal and breath sounds normal. No respiratory distress. He has no wheezes.  Abdominal: Soft. Bowel sounds are normal. He exhibits no distension. There is no tenderness.  Musculoskeletal: Normal range of motion. He exhibits no edema.  Lymphadenopathy:    He has no cervical adenopathy.  Neurological: He is alert and oriented to person, place, and time.  Skin: Skin is warm and dry. He is not diaphoretic.    ED Course  Procedures (including critical care time)  Labs Reviewed  CBC WITH DIFFERENTIAL  COMPREHENSIVE METABOLIC PANEL  TROPONIN I   Dg Chest 2 View  08/04/2012   *RADIOLOGY REPORT*  Clinical Data: Chest pain, shortness of breath.  CHEST - 2 VIEW  Comparison: 01/10/2012  Findings: Heart and mediastinal contours are within normal limits. No focal opacities or effusions.  No acute bony abnormality.  IMPRESSION: No active cardiopulmonary disease.   Original Report Authenticated By:  Charlett Nose, M.D.     No diagnosis found.   Date: 08/04/2012  Rate: 69  Rhythm: normal sinus rhythm  QRS Axis: normal  Intervals: normal  ST/T Wave abnormalities: normal  Conduction Disutrbances:none  Narrative Interpretation:   Old EKG Reviewed: none available    MDM  The workup reveals negative troponin and non-diagnostic ekg.  Due to the nature of his symptoms and multiple risk factors, I feel as though admission for rule out of MI is indicated.  I have spoken  with Dr. Ardyth Harps who agrees to admit.          Geoffery Lyons, MD 08/04/12 [id number]

## 2012-08-04 NOTE — H&P (Signed)
[facility]          History and Physical    PCP:   Dorrene German, MD   Chief Complaint:  Chest Pain  HPI: 57 y/o man with PMH significant for HTN, HLD, tobacco abuse as well as family history for CAD. He started having CP last pm and thought it was heartburn. This [place] he again developed left sided CP that "almost knocked me over" and took his breath away. He states he also broke out into a cold sweat. He decided to come to the ED for evaluation. Troponin is negative. EKG shows LVH and some ?V1 ST abnormalities. We have been asked to admit him for further evaluation and management.  Allergies:  No Known Allergies    Past Medical History  Diagnosis Date  . Depression     No past surgical history on file.  Prior to Admission medications   Medication Sig Start Date End Date Taking? Authorizing Provider  GOLDEN SEAL PO Take 1 tablet by mouth daily.   Yes Historical Provider, MD    Social History:  reports that he has been smoking.  He does not have any smokeless tobacco history on file. He reports that he does not drink alcohol or use illicit drugs.  No family history on file.  Review of Systems:  Constitutional: Denies fever, chills, diaphoresis, appetite change and fatigue.  HEENT: Denies photophobia, eye pain, redness, hearing loss, ear pain, congestion, sore throat, rhinorrhea, sneezing, mouth sores, trouble swallowing, neck pain, neck stiffness and tinnitus.   Respiratory: Denies cough  and wheezing.   Cardiovascular: Denies  palpitations and leg swelling.  Gastrointestinal: Denies nausea, vomiting, abdominal pain, diarrhea, constipation, blood in stool and abdominal distention.  Genitourinary: Denies dysuria, urgency, frequency, hematuria, flank pain and difficulty urinating.  Endocrine: Denies: hot or cold intolerance, sweats, changes in hair or nails, polyuria, polydipsia. Musculoskeletal: Denies myalgias, back  pain, joint swelling, arthralgias and gait problem.  Skin: Denies pallor, rash and wound.  Neurological: Denies dizziness, seizures, syncope, weakness, light-headedness, numbness and headaches.  Hematological: Denies adenopathy. Easy bruising, personal or family bleeding history  Psychiatric/Behavioral: Denies suicidal ideation, mood changes, confusion, nervousness, sleep disturbance and agitation   Physical Exam: Blood pressure 169/102, pulse 79, temperature 97.9 F (36.6 C), temperature source Oral, resp. rate 18, SpO2 100.00%. Gen: AA Ox3, NAD HEENT: [place]/AT/PERRL/EOMI/moist mucous membranes Neck: supple, no JVD, no LAD, no bruits, no goiter CV: RRR, no M/R/G Lungs: CTA B Abd: S/NT/ND/+BS/no masses or organomegaly noted. Ext: no C/C/E, +pedal pulses Neuro: grossly intact and non-focal.  Labs on Admission:  Results for orders placed during the hospital encounter of 08/04/12 (from the past 48 hour(s))  CBC WITH DIFFERENTIAL     Status: None   Collection Time    08/04/12  1:33 PM      Result Value Range   WBC 8.8  4.0 - 10.5 K/uL   RBC 5.13  4.22 - 5.81 MIL/uL   Hemoglobin 15.1  13.0 - 17.0 g/dL   HCT 16.1  09.6 - 04.5 %   MCV 82.8  78.0 - 100.0 fL   MCH 29.4  26.0 - 34.0 pg   MCHC 35.5  30.0 - 36.0 g/dL   RDW 40.9  81.1 - 91.4 %   Platelets 216  150 - 400 K/uL   Neutrophils Relative % 65  43 - 77 %   Neutro Abs 5.7  1.7 - 7.7 K/uL   Lymphocytes Relative 27  12 - 46 %   Lymphs Abs 2.4  0.7 - 4.0 K/uL   Monocytes Relative 7  3 - 12 %   Monocytes Absolute 0.6  0.1 - 1.0 K/uL   Eosinophils Relative 1  0 - 5 %   Eosinophils Absolute 0.1  0.0 - 0.7 K/uL   Basophils Relative 0  0 - 1 %   Basophils Absolute 0.0  0.0 - 0.1 K/uL  COMPREHENSIVE METABOLIC PANEL     Status: None   Collection Time    08/04/12  1:33 PM      Result Value Range   Sodium 139  135 - 145 mEq/L   Potassium 3.8  3.5 - 5.1 mEq/L   Chloride 103  96 - 112 mEq/L   CO2 26  19 - 32 mEq/L   Glucose, Bld 83  70  - 99 mg/dL   BUN 11  6 - 23 mg/dL   Creatinine, Ser 1.47  0.50 - 1.35 mg/dL   Calcium 9.5  8.4 - 82.9 mg/dL   Total Protein 7.1  6.0 - 8.3 g/dL   Albumin 4.2  3.5 - 5.2 g/dL   AST 16  0 - 37 U/L   ALT 11  0 - 53 U/L   Alkaline Phosphatase 78  39 - 117 U/L   Total Bilirubin 0.6  0.3 - 1.2 mg/dL   GFR calc non Af Amer >90  >90 mL/min   GFR calc Af Amer >90  >90 mL/min   Comment:            The eGFR has been calculated     using the CKD EPI equation.     This calculation has not been     validated in all clinical     situations.     eGFR's persistently     <90 mL/min signify     possible Chronic Kidney Disease.  TROPONIN I     Status: None   Collection Time    08/04/12  1:34 PM      Result Value Range   Troponin I <0.30  <0.30 ng/mL   Comment:            Due to the release kinetics of cTnI,     a negative result within the first hours     of the onset of symptoms does not rule out     myocardial infarction with certainty.     If myocardial infarction is still suspected,     repeat the test at appropriate intervals.    Radiological Exams on Admission: Dg Chest 2 View  08/04/2012   *RADIOLOGY REPORT*  Clinical Data: Chest pain, shortness of breath.  CHEST - 2 VIEW  Comparison: 01/10/2012  Findings: Heart and mediastinal contours are within normal limits. No focal opacities or effusions.  No acute bony abnormality.  IMPRESSION: No active cardiopulmonary disease.   Original Report Authenticated By: Charlett Nose, M.D.    Assessment/Plan Principal Problem:   Chest pain Active Problems:   HTN (hypertension)   Hyperlipidemia   Tobacco abuse   Chest Pain -Admit to tele as an observation. -Cycle troponins and cardiac enzymes. -Check TSH/A1c/FLP for further risk stratification. -ECHO ordered. -CXR without acute abnormality. -If he rules out, would consider stress test given his multiple CAD risk factors.  HTN -BP quite elevated. -Has not been taking his medications due to  financial constraints. -Will start ACE-I/BB.  Hyperlipidemia -Check FLP. -May need statin if LDL >100.  Tobacco Abuse -  Cessation counseling provided.  DVT Prophylaxis -Lovenox.   Time Spent on Admission: 75 minutes.  Chaya Jan [facility] Pager: [phone] 08/04/2012, 4:11 PM

## 2012-08-04 NOTE — ED Notes (Signed)
Patient states that he was just coming from a meeting this AM when the chest pain hit.  States that it almost knocked him down.  Pain was sharp and in his left lateral chest. Area is non tender to palpation. Patient states that taking a deep breath causes his chest to hurt.
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
## 2012-08-05 DIAGNOSIS — R079 Chest pain, unspecified: Secondary | ICD-10-CM

## 2012-08-05 LAB — BASIC METABOLIC PANEL
BUN: 16 mg/dL (ref 6–23)
Chloride: 104 mEq/L (ref 96–112)
Glucose, Bld: 92 mg/dL (ref 70–99)
Potassium: 4 mEq/L (ref 3.5–5.1)

## 2012-08-05 LAB — CBC
HCT: 41.6 % (ref 39.0–52.0)
Hemoglobin: 14.3 g/dL (ref 13.0–17.0)
MCH: 28.8 pg (ref 26.0–34.0)
MCHC: 34.4 g/dL (ref 30.0–36.0)
MCV: 83.9 fL (ref 78.0–100.0)

## 2012-08-05 LAB — LIPID PANEL
HDL: 75 mg/dL (ref >39)
Triglycerides: 88 mg/dL (ref <150)

## 2012-08-05 MED ORDER — ASPIRIN 81 MG PO TBEC: 81.0000 mg | DELAYED_RELEASE_TABLET | Freq: Every day | ORAL | Status: DC

## 2012-08-05 MED ORDER — NICOTINE 21 MG/24HR TD PT24: 1.0000 | MEDICATED_PATCH | Freq: Once | TRANSDERMAL | Status: DC

## 2012-08-05 MED ORDER — METOPROLOL TARTRATE 12.5 MG HALF TABLET: 12.5000 mg | ORAL_TABLET | Freq: Two times a day (BID) | ORAL | Status: DC

## 2012-08-05 MED ORDER — LISINOPRIL 5 MG PO TABS: 5.0000 mg | ORAL_TABLET | Freq: Every day | ORAL | Status: DC

## 2012-08-05 NOTE — Discharge Summary (Signed)
Physician Discharge Summary  Paul Brady ZOX:[id number] DOB: October 23, 1955 DOA: 08/04/2012  PCP: Dorrene German, MD  Admit date: 08/04/2012 Discharge date: 08/05/2012  Time spent: 40 minutes  Recommendations for Outpatient Follow-up:  1. Please followup with Dr. Algie Coffer for potential stress-test 2. Stop smoking 3. Low-salt diet  Discharge Diagnoses:  Principal Problem:   Chest pain Active Problems:   HTN (hypertension)   Hyperlipidemia   Tobacco abuse   Discharge Condition: Good  Diet recommendation: Low-salt diet  Filed Weights   08/04/12 1712 08/05/12 0451  Weight: 89.767 kg (197 lb 14.4 oz) 88.996 kg (196 lb 3.2 oz)    History of present illness:  This 57 year old African American male with history of hypertension hyperlipidemia tobacco use and family history CAD started having chest pain at a voluntary meeting yesterday. He managed to finish the meeting but stated that he developed left-sided chest pain that took his breath away. This was associated with diaphoresis as well as discomfort. EKG showed LVH and ST abnormalities. Patient was ruled out by cardiac enzymes x3 and stated that his pain was reproducible by taking very deep breaths. He had some slight tingling in his left arm. he was scheduled for an outpatient stress test with Dr. Algie Coffer within a week and was instructed to try to quit smoking. He's been given new medications for blood pressure and for beta-blockade. It was thought that his chest pain is multifactorial, with angina being a part of the differential diagnosis as well as reflux disease and an anxiety disorder  Discharge Exam: Filed Vitals:   08/04/12 1712 08/04/12 1804 08/04/12 2049 08/05/12 0451  BP: 159/100 163/73 131/78 123/80  Pulse: 83 69 65 63  Temp: 98.8 F (37.1 C) 98.9 F (37.2 C) 98.4 F (36.9 C) 98.3 F (36.8 C)  TempSrc:   Oral Oral  Resp: 18 18    Height: 6\' 2"  (1.88 m)     Weight: 89.767 kg (197 lb 14.4 oz)   88.996 kg (196 lb 3.2  oz)  SpO2: 100% 96% 100% 100%   Alert pleasant oriented No apparent distress. Still has tightness when he takes a deep breath no radiating chest pain currently no pain at rest no reproducible pain  General: EOMI Cardiovascular: S1-S2 no murmur rub or gallop Respiratory: Clinically clear  Discharge Instructions  Discharge Orders   Future Orders Complete By Expires     Diet - low sodium heart healthy  As directed     Discharge instructions  As directed     Comments:      You were admitted to the hospital overnight with transient chest pain. It is still unclear what the actual cause of this was. You will probably need an exercise stress test as per discharge summary and instructions.  You will need followup with the cardiologist that I have mentioned. Please stop smoking    Increase activity slowly  As directed         Medication List    TAKE these medications       aspirin 81 MG EC tablet  Take 1 tablet (81 mg total) by mouth daily.     GOLDEN SEAL PO  Take 1 tablet by mouth daily.     lisinopril 5 MG tablet  Commonly known as:  PRINIVIL,ZESTRIL  Take 1 tablet (5 mg total) by mouth daily.     metoprolol tartrate 12.5 mg Tabs  Commonly known as:  LOPRESSOR  Take 0.5 tablets (12.5 mg total) by mouth 2 (two) times daily.  nicotine 21 mg/24hr patch  Commonly known as:  NICODERM CQ - dosed in mg/24 hours  Place 1 patch onto the skin once.       No Known Allergies     Follow-up Information   Follow up with Dorrene German, MD.   Contact information:   [address] [place] [address] [phone]       Schedule an appointment as soon as possible for a visit with [facility], MD. (Followup for potential exercise stress test)    Contact information:   [address] [place] [address] [phone]        The results of significant diagnostics from this hospitalization (including imaging, microbiology, ancillary and laboratory) are  listed below for reference.    Significant Diagnostic Studies: Dg Chest 2 View  08/04/2012   *RADIOLOGY REPORT*  Clinical Data: Chest pain, shortness of breath.  CHEST - 2 VIEW  Comparison: 01/10/2012  Findings: Heart and mediastinal contours are within normal limits. No focal opacities or effusions.  No acute bony abnormality.  IMPRESSION: No active cardiopulmonary disease.   Original Report Authenticated By: Charlett Nose, M.D.    Microbiology: No results found for this or any previous visit (from the past 240 hour(s)).   Labs: Basic Metabolic Panel:  Recent Labs Lab 08/04/12 1333 08/04/12 1800 08/05/12 0509  NA 139  --  140  K 3.8  --  4.0  CL 103  --  104  CO2 26  --  27  GLUCOSE 83  --  92  BUN 11  --  16  CREATININE 0.91 0.93 0.98  CALCIUM 9.5  --  9.1   Liver Function Tests:  Recent Labs Lab 08/04/12 1333  AST 16  ALT 11  ALKPHOS 78  BILITOT 0.6  PROT 7.1  ALBUMIN 4.2   No results found for this basename: LIPASE, AMYLASE,  in the last 168 hours No results found for this basename: AMMONIA,  in the last 168 hours CBC:  Recent Labs Lab 08/04/12 1333 08/04/12 1800 08/05/12 0509  WBC 8.8 8.7 6.7  NEUTROABS 5.7  --   --   HGB 15.1 14.6 14.3  HCT 42.5 41.4 41.6  MCV 82.8 82.8 83.9  PLT 216 219 203   Cardiac Enzymes:  Recent Labs Lab 08/04/12 1334 08/04/12 1800 08/04/12 2338 08/05/12 0509  TROPONINI <0.30 <0.30 <0.30 <0.30   BNP: BNP (last 3 results) No results found for this basename: PROBNP,  in the last 8760 hours CBG: No results found for this basename: GLUCAP,  in the last 168 hours     Signed:  Rhetta Mura  [facility] 08/05/2012, 9:31 AM

## 2012-08-05 NOTE — Progress Notes (Signed)
Pt provided with dc instructions and education. Pt verbalized understanding. Pt aware of new medication and how to take them. No questions at this time. VSS. IV removed with tip intact. Heart monitor cleaned and returned to front. Levonne Spiller, RN

## 2013-03-18 ENCOUNTER — Encounter (HOSPITAL_COMMUNITY): Payer: Self-pay | Admitting: Pharmacy Technician

## 2013-03-23 ENCOUNTER — Encounter (HOSPITAL_COMMUNITY): Payer: Self-pay

## 2013-03-23 ENCOUNTER — Encounter (HOSPITAL_COMMUNITY)
Admission: RE | Admit: 2013-03-23 | Discharge: 2013-03-23 | Disposition: A | Discharge status: Home / Self Care | Payer: Medicare Other | Source: Ambulatory Visit | Attending: Orthopedic Surgery | Admitting: Orthopedic Surgery

## 2013-03-23 LAB — BASIC METABOLIC PANEL
BUN: 11 mg/dL (ref 6–23)
CO2: 27 mEq/L (ref 19–32)
Calcium: 9.4 mg/dL (ref 8.4–10.5)
Chloride: 102 mEq/L (ref 96–112)
Creatinine, Ser: 1.02 mg/dL (ref 0.50–1.35)
GFR, EST NON AFRICAN AMERICAN: 81 mL/min — AB (ref >90)
Glucose, Bld: 94 mg/dL (ref 70–99)
POTASSIUM: 4.4 meq/L (ref 3.7–5.3)
Sodium: 140 mEq/L (ref 137–147)

## 2013-03-23 LAB — CBC
HCT: 41.7 % (ref 39.0–52.0)
HEMOGLOBIN: 14.6 g/dL (ref 13.0–17.0)
MCH: 29.6 pg (ref 26.0–34.0)
MCHC: 35 g/dL (ref 30.0–36.0)
MCV: 84.6 fL (ref 78.0–100.0)
Platelets: 252 10*3/uL (ref 150–400)
RBC: 4.93 MIL/uL (ref 4.22–5.81)
RDW: 14.6 % (ref 11.5–15.5)
WBC: 8.7 10*3/uL (ref 4.0–10.5)

## 2013-03-23 LAB — SURGICAL PCR SCREEN
MRSA, PCR: NEGATIVE
STAPHYLOCOCCUS AUREUS: NEGATIVE

## 2013-03-23 MED ORDER — CEFAZOLIN SODIUM-DEXTROSE 2-3 GM-% IV SOLR
2.0000 g | INTRAVENOUS | Status: AC
  Administered 2013-03-24: 2 g via INTRAVENOUS
  Filled 2013-03-23: qty 50

## 2013-03-23 NOTE — Pre-Procedure Instructions (Signed)
Paul Brady  03/23/2013   Your procedure is scheduled on:  March 24, 2013  Report to [facility] (Entrance A) at 11 AM.  Call this number if you have problems the morning of surgery: [phone]   Remember:    BRING BRACE   Do not eat food or drink liquids after midnight.   Take these medicines the morning of surgery with A SIP OF WATER: ALPRAZolam [name]) as needed for anxiety, amLODipine (NORVASC), FLUoxetine (PROZAC)   Do not wear jewelry.  Do not wear lotions, powders, or colognes. You may wear deodorant.  Men may shave face and neck only.  Do not bring valuables to the hospital.  [facility] is not responsible for any belongings or valuables.               Contacts, dentures or bridgework may not be worn into surgery.  Leave suitcase in the car. After surgery it may be brought to your room.  For patients admitted to the hospital, discharge time is determined by your                treatment team.               Patients discharged the day of surgery will not be allowed to drive  home.  Name and phone number of your driver:   Special Instructions: Shower using CHG 2 nights before surgery and the night before surgery.  If you shower the day of surgery use CHG.  Use special wash - you have one bottle of CHG for all showers.  You should use approximately 1/3 of the bottle for each shower.   Please read over the following fact sheets that you were given: Pain Booklet, Coughing and Deep Breathing and Surgical Site Infection Prevention

## 2013-03-23 NOTE — Progress Notes (Signed)
Pt stated saw Dr Doylene Canard approximately one month ago for cardiac clearance.  Cardiac clearance/records requested.

## 2013-03-24 ENCOUNTER — Inpatient Hospital Stay (HOSPITAL_COMMUNITY)
Admission: RE | Admit: 2013-03-24 | Discharge: 2013-03-25 | DRG: 473 | Disposition: A | Discharge status: Home / Self Care | Payer: Medicare Other | Source: Ambulatory Visit | Attending: Orthopedic Surgery | Admitting: Orthopedic Surgery

## 2013-03-24 ENCOUNTER — Encounter (HOSPITAL_COMMUNITY)
Admission: RE | Disposition: A | Discharge status: Home / Self Care | Payer: Self-pay | Source: Ambulatory Visit | Attending: Orthopedic Surgery

## 2013-03-24 ENCOUNTER — Observation Stay (HOSPITAL_COMMUNITY): Payer: Medicare Other

## 2013-03-24 ENCOUNTER — Encounter (HOSPITAL_COMMUNITY): Payer: Self-pay | Admitting: Surgery

## 2013-03-24 ENCOUNTER — Ambulatory Visit (HOSPITAL_COMMUNITY): Payer: Medicare Other

## 2013-03-24 ENCOUNTER — Encounter (HOSPITAL_COMMUNITY): Payer: Medicare Other | Admitting: Anesthesiology

## 2013-03-24 ENCOUNTER — Ambulatory Visit (HOSPITAL_COMMUNITY): Payer: Medicare Other | Admitting: Anesthesiology

## 2013-03-24 DIAGNOSIS — F329 Major depressive disorder, single episode, unspecified: Secondary | ICD-10-CM | POA: Diagnosis present

## 2013-03-24 DIAGNOSIS — M5412 Radiculopathy, cervical region: Secondary | ICD-10-CM | POA: Diagnosis present

## 2013-03-24 DIAGNOSIS — F17200 Nicotine dependence, unspecified, uncomplicated: Secondary | ICD-10-CM | POA: Diagnosis present

## 2013-03-24 DIAGNOSIS — Z981 Arthrodesis status: Secondary | ICD-10-CM

## 2013-03-24 DIAGNOSIS — K219 Gastro-esophageal reflux disease without esophagitis: Secondary | ICD-10-CM | POA: Diagnosis present

## 2013-03-24 DIAGNOSIS — Z01818 Encounter for other preprocedural examination: Secondary | ICD-10-CM

## 2013-03-24 DIAGNOSIS — F3289 Other specified depressive episodes: Secondary | ICD-10-CM | POA: Diagnosis present

## 2013-03-24 DIAGNOSIS — I10 Essential (primary) hypertension: Secondary | ICD-10-CM | POA: Diagnosis present

## 2013-03-24 DIAGNOSIS — M542 Cervicalgia: Secondary | ICD-10-CM | POA: Diagnosis present

## 2013-03-24 DIAGNOSIS — M47812 Spondylosis without myelopathy or radiculopathy, cervical region: Principal | ICD-10-CM | POA: Diagnosis present

## 2013-03-24 DIAGNOSIS — Z01812 Encounter for preprocedural laboratory examination: Secondary | ICD-10-CM

## 2013-03-24 HISTORY — PX: ANTERIOR CERVICAL DECOMP/DISCECTOMY FUSION: SHX1161

## 2013-03-24 SURGERY — ANTERIOR CERVICAL DECOMPRESSION/DISCECTOMY FUSION 2 LEVELS
Anesthesia: General | Site: Spine Cervical

## 2013-03-24 MED ORDER — BUPIVACAINE-EPINEPHRINE (PF) 0.25% -1:200000 IJ SOLN
INTRAMUSCULAR | Status: AC
  Filled 2013-03-24: qty 30

## 2013-03-24 MED ORDER — NEOSTIGMINE METHYLSULFATE 1 MG/ML IJ SOLN
INTRAMUSCULAR | Status: DC | PRN
  Administered 2013-03-24: 5 mg via INTRAVENOUS

## 2013-03-24 MED ORDER — DEXAMETHASONE SODIUM PHOSPHATE 4 MG/ML IJ SOLN
4.0000 mg | Freq: Four times a day (QID) | INTRAMUSCULAR | Status: DC
  Filled 2013-03-24 (×7): qty 1

## 2013-03-24 MED ORDER — QUETIAPINE FUMARATE 400 MG PO TABS
400.0000 mg | ORAL_TABLET | Freq: Every day | ORAL | Status: DC
  Administered 2013-03-24: 400 mg via ORAL
  Filled 2013-03-24 (×2): qty 1

## 2013-03-24 MED ORDER — MIDAZOLAM HCL 5 MG/5ML IJ SOLN
INTRAMUSCULAR | Status: DC | PRN
  Administered 2013-03-24 (×2): 1 mg via INTRAVENOUS

## 2013-03-24 MED ORDER — LACTATED RINGERS IV SOLN
INTRAVENOUS | Status: DC | PRN
  Administered 2013-03-24 (×2): via INTRAVENOUS

## 2013-03-24 MED ORDER — MORPHINE SULFATE 2 MG/ML IJ SOLN: 1.0000 mg | INTRAMUSCULAR | Status: DC | PRN

## 2013-03-24 MED ORDER — METHOCARBAMOL 100 MG/ML IJ SOLN
500.0000 mg | Freq: Four times a day (QID) | INTRAMUSCULAR | Status: DC | PRN
  Administered 2013-03-24: 500 mg via INTRAVENOUS
  Filled 2013-03-24: qty 5

## 2013-03-24 MED ORDER — MENTHOL 3 MG MT LOZG: 1.0000 | LOZENGE | OROMUCOSAL | Status: DC | PRN

## 2013-03-24 MED ORDER — ALPRAZOLAM 0.25 MG PO TABS
0.2500 mg | ORAL_TABLET | Freq: Two times a day (BID) | ORAL | Status: DC | PRN
  Administered 2013-03-24 – 2013-03-25 (×2): 0.25 mg via ORAL
  Filled 2013-03-24 (×2): qty 1

## 2013-03-24 MED ORDER — MEPERIDINE HCL 25 MG/ML IJ SOLN: 6.2500 mg | INTRAMUSCULAR | Status: DC | PRN

## 2013-03-24 MED ORDER — ONDANSETRON HCL 4 MG/2ML IJ SOLN
INTRAMUSCULAR | Status: DC | PRN
  Administered 2013-03-24: 4 mg via INTRAVENOUS

## 2013-03-24 MED ORDER — GLYCOPYRROLATE 0.2 MG/ML IJ SOLN
INTRAMUSCULAR | Status: DC | PRN
  Administered 2013-03-24: .7 mg via INTRAVENOUS

## 2013-03-24 MED ORDER — ONDANSETRON HCL 4 MG/2ML IJ SOLN: 4.0000 mg | INTRAMUSCULAR | Status: DC | PRN

## 2013-03-24 MED ORDER — SODIUM CHLORIDE 0.9 % IJ SOLN: 3.0000 mL | INTRAMUSCULAR | Status: DC | PRN

## 2013-03-24 MED ORDER — BUPIVACAINE-EPINEPHRINE 0.25% -1:200000 IJ SOLN
INTRAMUSCULAR | Status: DC | PRN
  Administered 2013-03-24: 4 mg

## 2013-03-24 MED ORDER — SODIUM CHLORIDE 0.9 % IJ SOLN: 3.0000 mL | Freq: Two times a day (BID) | INTRAMUSCULAR | Status: DC

## 2013-03-24 MED ORDER — DEXAMETHASONE SODIUM PHOSPHATE 4 MG/ML IJ SOLN: 4.0000 mg | Freq: Once | INTRAMUSCULAR | Status: DC

## 2013-03-24 MED ORDER — METHOCARBAMOL 500 MG PO TABS
500.0000 mg | ORAL_TABLET | Freq: Four times a day (QID) | ORAL | Status: DC | PRN
  Filled 2013-03-24: qty 1

## 2013-03-24 MED ORDER — LACTATED RINGERS IV SOLN
INTRAVENOUS | Status: DC
  Administered 2013-03-24: 12:00:00 via INTRAVENOUS

## 2013-03-24 MED ORDER — HYDROMORPHONE HCL PF 1 MG/ML IJ SOLN
INTRAMUSCULAR | Status: AC
  Filled 2013-03-24: qty 1

## 2013-03-24 MED ORDER — THROMBIN 20000 UNITS EX SOLR
CUTANEOUS | Status: DC | PRN
  Administered 2013-03-24: 14:00:00 via TOPICAL

## 2013-03-24 MED ORDER — PHENYLEPHRINE HCL 10 MG/ML IJ SOLN
INTRAMUSCULAR | Status: DC | PRN
  Administered 2013-03-24: 40 ug via INTRAVENOUS
  Administered 2013-03-24: 80 ug via INTRAVENOUS
  Administered 2013-03-24: 40 ug via INTRAVENOUS
  Administered 2013-03-24: 80 ug via INTRAVENOUS

## 2013-03-24 MED ORDER — LACTATED RINGERS IV SOLN
INTRAVENOUS | Status: DC
  Administered 2013-03-24: 18:00:00 via INTRAVENOUS

## 2013-03-24 MED ORDER — THROMBIN 20000 UNITS EX SOLR
CUTANEOUS | Status: AC
  Filled 2013-03-24: qty 20000

## 2013-03-24 MED ORDER — OXYCODONE HCL 5 MG PO TABS
10.0000 mg | ORAL_TABLET | ORAL | Status: DC | PRN
  Administered 2013-03-25 (×2): 10 mg via ORAL
  Filled 2013-03-24 (×3): qty 2

## 2013-03-24 MED ORDER — OXYCODONE HCL 5 MG PO TABS
ORAL_TABLET | ORAL | Status: AC
  Administered 2013-03-24: 10 mg
  Filled 2013-03-24: qty 1

## 2013-03-24 MED ORDER — PROPOFOL 10 MG/ML IV BOLUS
INTRAVENOUS | Status: DC | PRN
  Administered 2013-03-24: 20 mg via INTRAVENOUS
  Administered 2013-03-24: 150 mg via INTRAVENOUS

## 2013-03-24 MED ORDER — FLUOXETINE HCL 10 MG PO CAPS
10.0000 mg | ORAL_CAPSULE | Freq: Every day | ORAL | Status: DC
  Administered 2013-03-25: 10 mg via ORAL
  Filled 2013-03-24: qty 1

## 2013-03-24 MED ORDER — ONDANSETRON HCL 4 MG/2ML IJ SOLN: 4.0000 mg | Freq: Once | INTRAMUSCULAR | Status: DC | PRN

## 2013-03-24 MED ORDER — PHENOL 1.4 % MT LIQD: 1.0000 | OROMUCOSAL | Status: DC | PRN

## 2013-03-24 MED ORDER — LIDOCAINE HCL (CARDIAC) 20 MG/ML IV SOLN
INTRAVENOUS | Status: DC | PRN
  Administered 2013-03-24: 100 mg via INTRAVENOUS
  Administered 2013-03-24: 100 mg via INTRATRACHEAL

## 2013-03-24 MED ORDER — ARTIFICIAL TEARS OP OINT
TOPICAL_OINTMENT | OPHTHALMIC | Status: DC | PRN
  Administered 2013-03-24: 1 via OPHTHALMIC

## 2013-03-24 MED ORDER — DEXAMETHASONE SODIUM PHOSPHATE 4 MG/ML IJ SOLN
INTRAMUSCULAR | Status: AC
  Administered 2013-03-24: 10 mg via INTRAVENOUS
  Filled 2013-03-24: qty 1

## 2013-03-24 MED ORDER — DEXAMETHASONE 4 MG PO TABS
4.0000 mg | ORAL_TABLET | Freq: Four times a day (QID) | ORAL | Status: DC
  Administered 2013-03-24 – 2013-03-25 (×3): 4 mg via ORAL
  Filled 2013-03-24 (×7): qty 1

## 2013-03-24 MED ORDER — OXYCODONE HCL 5 MG PO TABS
5.0000 mg | ORAL_TABLET | Freq: Once | ORAL | Status: AC | PRN
  Administered 2013-03-24: 5 mg via ORAL

## 2013-03-24 MED ORDER — OXYCODONE HCL 5 MG/5ML PO SOLN: 5.0000 mg | Freq: Once | ORAL | Status: AC | PRN

## 2013-03-24 MED ORDER — HYDROMORPHONE HCL PF 1 MG/ML IJ SOLN
0.2500 mg | INTRAMUSCULAR | Status: DC | PRN
  Administered 2013-03-24 (×4): 0.5 mg via INTRAVENOUS

## 2013-03-24 MED ORDER — AMLODIPINE BESYLATE 5 MG PO TABS
5.0000 mg | ORAL_TABLET | Freq: Every day | ORAL | Status: DC
  Administered 2013-03-24 – 2013-03-25 (×2): 5 mg via ORAL
  Filled 2013-03-24 (×2): qty 1

## 2013-03-24 MED ORDER — SODIUM CHLORIDE 0.9 % IV SOLN: 250.0000 mL | INTRAVENOUS | Status: DC

## 2013-03-24 MED ORDER — FENTANYL CITRATE 0.05 MG/ML IJ SOLN
INTRAMUSCULAR | Status: DC | PRN
  Administered 2013-03-24 (×5): 50 ug via INTRAVENOUS
  Administered 2013-03-24: 100 ug via INTRAVENOUS

## 2013-03-24 MED ORDER — 0.9 % SODIUM CHLORIDE (POUR BTL) OPTIME
TOPICAL | Status: DC | PRN
  Administered 2013-03-24: 1000 mL

## 2013-03-24 MED ORDER — ROCURONIUM BROMIDE 100 MG/10ML IV SOLN
INTRAVENOUS | Status: DC | PRN
  Administered 2013-03-24: 50 mg via INTRAVENOUS

## 2013-03-24 MED ORDER — ACETAMINOPHEN 10 MG/ML IV SOLN
1000.0000 mg | Freq: Four times a day (QID) | INTRAVENOUS | Status: DC
  Administered 2013-03-24: 1000 mg via INTRAVENOUS
  Filled 2013-03-24: qty 100

## 2013-03-24 MED ORDER — ZOLPIDEM TARTRATE 5 MG PO TABS
5.0000 mg | ORAL_TABLET | Freq: Every evening | ORAL | Status: DC | PRN
Start: 2013-03-24 — End: 2013-03-25

## 2013-03-24 MED ORDER — CEFAZOLIN SODIUM 1-5 GM-% IV SOLN
1.0000 g | Freq: Three times a day (TID) | INTRAVENOUS | Status: AC
  Administered 2013-03-24 – 2013-03-25 (×2): 1 g via INTRAVENOUS
  Filled 2013-03-24 (×3): qty 50

## 2013-03-24 MED ORDER — VECURONIUM BROMIDE 10 MG IV SOLR
INTRAVENOUS | Status: DC | PRN
  Administered 2013-03-24 (×2): 1 mg via INTRAVENOUS
  Administered 2013-03-24: 2 mg via INTRAVENOUS
  Administered 2013-03-24: 1 mg via INTRAVENOUS

## 2013-03-24 SURGICAL SUPPLY — 64 items
BIT DRILL SKYLINE 12MM (BIT) ×1 IMPLANT
BLADE SURG ROTATE 9660 (MISCELLANEOUS) IMPLANT
BUR EGG ELITE 4.0 (BURR) IMPLANT
BUR EGG ELITE 4.0MM (BURR)
BUR MATCHSTICK NEURO 3.0 LAGG (BURR) IMPLANT
CANISTER SUCTION 2500CC (MISCELLANEOUS) ×3 IMPLANT
CLOSURE STERI-STRIP 1/2X4 (GAUZE/BANDAGES/DRESSINGS) ×1
CLOTH BEACON ORANGE TIMEOUT ST (SAFETY) ×3 IMPLANT
CLSR STERI-STRIP ANTIMIC 1/2X4 (GAUZE/BANDAGES/DRESSINGS) ×2 IMPLANT
CORDS BIPOLAR (ELECTRODE) ×3 IMPLANT
COVER SURGICAL LIGHT HANDLE (MISCELLANEOUS) ×3 IMPLANT
CRADLE DONUT ADULT HEAD (MISCELLANEOUS) ×3 IMPLANT
DRAPE C-ARM 42X72 X-RAY (DRAPES) ×3 IMPLANT
DRAPE POUCH INSTRU U-SHP 10X18 (DRAPES) ×3 IMPLANT
DRAPE SURG 17X23 STRL (DRAPES) ×3 IMPLANT
DRAPE U-SHAPE 47X51 STRL (DRAPES) ×3 IMPLANT
DRILL BIT SKYLINE 12MM (BIT) ×2
DRSG MEPILEX BORDER 4X4 (GAUZE/BANDAGES/DRESSINGS) ×3 IMPLANT
DURAPREP 26ML APPLICATOR (WOUND CARE) ×3 IMPLANT
ELECT COATED BLADE 2.86 ST (ELECTRODE) ×3 IMPLANT
ELECT REM PT RETURN 9FT ADLT (ELECTROSURGICAL) ×3
ELECTRODE REM PT RTRN 9FT ADLT (ELECTROSURGICAL) ×1 IMPLANT
ENDOSKELETON LG TC 6VBR 8MM (Orthopedic Implant) ×6 IMPLANT
GLOVE BIOGEL PI IND STRL 8 (GLOVE) ×1 IMPLANT
GLOVE BIOGEL PI IND STRL 8.5 (GLOVE) ×1 IMPLANT
GLOVE BIOGEL PI INDICATOR 8 (GLOVE) ×2
GLOVE BIOGEL PI INDICATOR 8.5 (GLOVE) ×2
GLOVE ECLIPSE 8.5 STRL (GLOVE) ×3 IMPLANT
GLOVE ORTHO TXT STRL SZ7.5 (GLOVE) ×3 IMPLANT
GOWN STRL REIN 2XL XLG LVL4 (GOWN DISPOSABLE) IMPLANT
GOWN STRL REIN XL XLG (GOWN DISPOSABLE) IMPLANT
GOWN STRL REUS W/TWL 2XL LVL3 (GOWN DISPOSABLE) ×6 IMPLANT
KIT BASIN OR (CUSTOM PROCEDURE TRAY) ×3 IMPLANT
KIT ROOM TURNOVER OR (KITS) ×3 IMPLANT
NEEDLE SPNL 18GX3.5 QUINCKE PK (NEEDLE) ×3 IMPLANT
NS IRRIG 1000ML POUR BTL (IV SOLUTION) ×3 IMPLANT
PACK ORTHO CERVICAL (CUSTOM PROCEDURE TRAY) ×3 IMPLANT
PACK UNIVERSAL I (CUSTOM PROCEDURE TRAY) ×3 IMPLANT
PAD ARMBOARD 7.5X6 YLW CONV (MISCELLANEOUS) ×6 IMPLANT
PATTIES SURGICAL .25X.25 (GAUZE/BANDAGES/DRESSINGS) ×3 IMPLANT
PIN DISTRACTION 14 (PIN) ×3 IMPLANT
PIN RETAINER PRODISC 14 MM (PIN) ×3 IMPLANT
PIN TEMP SKYLINE THREADED (PIN) ×3 IMPLANT
PLATE SKYLINE 2 LEVEL 34MM (Plate) ×3 IMPLANT
PUTTY BONE DBX 5CC MIX (Putty) ×3 IMPLANT
RESTRAINT LIMB HOLDER UNIV (RESTRAINTS) ×3 IMPLANT
SCREW SKYLINE 14MM SD-VA (Screw) ×12 IMPLANT
SCREW SKYLINE 16MM (Screw) ×6 IMPLANT
SPONGE INTESTINAL PEANUT (DISPOSABLE) ×3 IMPLANT
SPONGE LAP 4X18 X RAY DECT (DISPOSABLE) ×6 IMPLANT
SPONGE SURGIFOAM ABS GEL 100 (HEMOSTASIS) ×3 IMPLANT
SURGIFLO TRUKIT (HEMOSTASIS) IMPLANT
SUT MON AB 3-0 SH 27 (SUTURE) ×2
SUT MON AB 3-0 SH27 (SUTURE) ×1 IMPLANT
SUT SILK 2 0 (SUTURE)
SUT SILK 2-0 18XBRD TIE 12 (SUTURE) IMPLANT
SUT VIC AB 2-0 CT1 18 (SUTURE) ×3 IMPLANT
SYR BULB IRRIGATION 50ML (SYRINGE) ×3 IMPLANT
SYR CONTROL 10ML LL (SYRINGE) ×3 IMPLANT
TAPE CLOTH 4X10 WHT NS (GAUZE/BANDAGES/DRESSINGS) ×3 IMPLANT
TAPE UMBILICAL COTTON 1/8X30 (MISCELLANEOUS) ×3 IMPLANT
TOWEL OR 17X24 6PK STRL BLUE (TOWEL DISPOSABLE) ×3 IMPLANT
TOWEL OR 17X26 10 PK STRL BLUE (TOWEL DISPOSABLE) ×3 IMPLANT
WATER STERILE IRR 1000ML POUR (IV SOLUTION) IMPLANT

## 2013-03-24 NOTE — Anesthesia Postprocedure Evaluation (Signed)
  Anesthesia Post-op Note  Patient: Paul Brady  Procedure(s) Performed: Procedure(s): ACDF C5 - C7 2 LEVELS (N/A)  Patient Location: PACU  Anesthesia Type:General  Level of Consciousness: awake, alert  and oriented  Airway and Oxygen Therapy: Patient Spontanous Breathing and Patient connected to nasal cannula oxygen  Post-op Pain: mild  Post-op Assessment: Post-op Vital signs reviewed, Patient's Cardiovascular Status Stable, Respiratory Function Stable, Patent Airway and Pain level controlled  Post-op Vital Signs: stable  Complications: No apparent anesthesia complications

## 2013-03-24 NOTE — H&P (Signed)
History of Present Illness  The patient is a 58 year old male who presents with back pain. The patient is here today [name] is here to discuss his lumbar and cervical spine MRI scans. The patient reports lumbosacral area symptoms including low back pain and leg pain to the knee on the left following a specific injury (when he fell while running for a bus and tripped on an uneven sidewalk in November of 2013). The injury occurred during recreational activities. and Symptoms include pain (in the neck and back), decreased range of motion (in the neck), numbness (fingers bilateral hands), stiffness and tightness. The patient describes the severity of their symptoms as moderate in severity. The patient feels as if the symptoms are worsening. Current treatment includes oral corticosteroids (which did not help). Prior to being seen today the patient was previously evaluated in this clinic.     Subjective Transcription  He presents today for follow up. He continues to have significant neck pain with radiation into the C6 and C7 distribution on the left side.     Allergies No Known Drug Allergies.     Social History Current work status. disabled Alcohol use. current drinker; drinks beer and hard liquor; only occasionally per week Exercise. Exercises weekly; does running / walking Drug/Alcohol Rehab (Previously). no Drug/Alcohol Rehab (Currently). no Children. 0 Number of flights of stairs before winded. 4-5 Living situation. live alone Illicit drug use. no Tobacco use. Current every day smoker. current every day smoker; smoke(d) less than 1/2 pack(s) per day Marital status. married Pain Contract. no    Medication History Hydrocodone-Acetaminophen (5-325MG  Tablet, 1-2 Tablet Oral PO Q 4-6 HOURS AS NEEDED FOR PAIN, Taken starting 02/08/2013) Active. (ready for pick up 02/09/13 RMM RT-R,[facility]) Norco (7.5-325MG  Tablet, Oral) Active. Robaxin (500MG  Tablet,  Oral) Active. Medications Reconciled.    Objective Transcription  He has trace weakness of the triceps and wrist extension on that side. 1+ symmetrical deep tendon reflexes throughout. Positive Lhermitte's sign on the left extremity. Positive Spurling's sign. He has no significant shoulder, elbow or wrist pain with joint range of motion. He has tenderness to direct palpation over the trapezius. He has a normal gait pattern. He still has low back pain with palpation and range of motion but no radiation into the lower extremities.    RADIOGRAPHS:  X-rays were reviewed. He does have multilevel degenerative changes most predominant at C6-7. His MRI dated 02/01/13 shows a C5-6 left hard disc osteophyte causing severe foraminal stenosis and compression to the exiting C6 nerve root. At 4-5 there is some right sided disease and at 6-7 there is severe bilateral foraminal stenosis with mass effect on the C7 nerve root in the foramen.   Plans Transcription  At this point in time given the severity and duration of his symptoms he would like to proceed with surgery. We discussed injection therapy but I do not think that is going to help eliminate the bone spur causing the nerve compression. He is also not interested in proceeding with injections. Surgery would be a two level anterior cervical discectomy and fusion. He has poor disc quality at C6-7 and severe foraminal stenosis on the left side as well as C7 radicular symptoms in addition to which he has direct nerve compression at C5-6 again with C6 nerve compression signs. Given the fact that clinically he has disease in the C6 and C7 nerve distributions I have recommended two level ACDF. The risks include infection, bleeding, nerve damage, death, stroke, paralysis,  failure to heal, need for further surgery, ongoing or worse pain, throat pain, swallowing difficulty, hoarseness in the voice, need for further surgery, need for posterior surgery. All of his  questions were addressed. We have discussed the surgical procedure, the incision, the postoperative care and appropriate expectations. The goal is to reduce his pain to a 3 and improve his quality of life.  With respect to his lumbar MRI I do agree with the radiology report. I do not see the need for surgical intervention. At L4-5 and L5-S1 there are small disc protrusions but no significant neurocompression. He is an otherwise healthy gentleman so we will have preoperative plan by the anesthesiologist. Because of his use of nicotine which I have advised him he needs to stop to help reduce the risks of pseudoarthrosis and the fact that this is a two level procedure, I would also request an external bone stimulator to be used postoperatively.

## 2013-03-24 NOTE — Anesthesia Procedure Notes (Addendum)
Procedure Name: Intubation Date/Time: 03/24/2013 1:28 PM Performed by: Jenne Campus Pre-anesthesia Checklist: Patient identified, Emergency Drugs available, Suction available, Patient being monitored and Timeout performed Patient Re-evaluated:Patient Re-evaluated prior to inductionOxygen Delivery Method: Circle system utilized Preoxygenation: Pre-oxygenation with 100% oxygen Intubation Type: IV induction Ventilation: Mask ventilation without difficulty Laryngoscope Size: Miller and 3 Grade View: Grade II Tube type: Oral Tube size: 7.5 mm Number of attempts: 1 Airway Equipment and Method: Stylet Placement Confirmation: ETT inserted through vocal cords under direct vision,  CO2 detector and breath sounds checked- equal and bilateral Secured at: 23 cm Tube secured with: Tape Dental Injury: Teeth and Oropharynx as per pre-operative assessment

## 2013-03-24 NOTE — Transfer of Care (Signed)
Immediate Anesthesia Transfer of Care Note  Patient: Paul Brady  Procedure(s) Performed: Procedure(s): ACDF C5 - C7 2 LEVELS (N/A)  Patient Location: PACU  Anesthesia Type:General  Level of Consciousness: awake, alert , oriented and patient cooperative  Airway & Oxygen Therapy: Patient Spontanous Breathing and Patient connected to nasal cannula oxygen  Post-op Assessment: Report given to PACU RN and Post -op Vital signs reviewed and stable  Post vital signs: Reviewed  Complications: No apparent anesthesia complications

## 2013-03-24 NOTE — Anesthesia Preprocedure Evaluation (Addendum)
Anesthesia Evaluation  Patient identified by MRN, date of birth, ID band Patient awake    Reviewed: Allergy & Precautions, H&P , NPO status , Patient's Chart, lab work & pertinent test results, reviewed documented beta blocker date and time   History of Anesthesia Complications Negative for: history of anesthetic complications  Airway Mallampati: II TM Distance: >3 FB Neck ROM: Full    Dental  (+) Teeth Intact and Dental Advisory Given   Pulmonary Current Smoker,          Cardiovascular hypertension, Pt. on medications Rhythm:Regular Rate:Normal  Pt decided to stop his Norvasc a few days ago.   Neuro/Psych PSYCHIATRIC DISORDERS Depression    GI/Hepatic Neg liver ROS, GERD-  Medicated and Controlled,  Endo/Other  negative endocrine ROS  Renal/GU negative Renal ROS     Musculoskeletal   Abdominal   Peds  Hematology negative hematology ROS (+)   Anesthesia Other Findings   Reproductive/Obstetrics negative OB ROS                         Anesthesia Physical Anesthesia Plan  ASA: II  Anesthesia Plan: General   Post-op Pain Management:    Induction: Intravenous  Airway Management Planned: Oral ETT  Additional Equipment:   Intra-op Plan:   Post-operative Plan: Extubation in OR  Informed Consent: I have reviewed the patients History and Physical, chart, labs and discussed the procedure including the risks, benefits and alternatives for the proposed anesthesia with the patient or authorized representative who has indicated his/her understanding and acceptance.     Plan Discussed with: CRNA and Surgeon  Anesthesia Plan Comments:         Anesthesia Quick Evaluation

## 2013-03-24 NOTE — Brief Op Note (Signed)
03/24/2013  4:05 PM  PATIENT:  Paul Brady  58 y.o. male  PRE-OPERATIVE DIAGNOSIS:  cervical spondylotic Radiculopathy C5 - C7  POST-OPERATIVE DIAGNOSIS:  cervical spondylotic Radiculopathy C5 - C7  PROCEDURE:  Procedure(s): ACDF C5 - C7 2 LEVELS (N/A)  SURGEON:  Surgeon(s) and Role:    * Melina Schools, MD - Primary  PHYSICIAN ASSISTANT:   ASSISTANTS: Benjiman Core    ANESTHESIA:   general  EBL:  Total I/O In: 1500 [I.V.:1500] Out: 50 [Blood:50]  BLOOD ADMINISTERED:none  DRAINS: none   LOCAL MEDICATIONS USED:  MARCAINE     SPECIMEN:  No Specimen  DISPOSITION OF SPECIMEN:  N/A  COUNTS:  YES  TOURNIQUET:  * No tourniquets in log *  DICTATION: .Other Dictation: Dictation Number [id number]  PLAN OF CARE: Admit to inpatient   PATIENT DISPOSITION:  PACU - hemodynamically stable.

## 2013-03-24 NOTE — Preoperative (Signed)
Beta Blockers   Reason not to administer Beta Blockers:Not Applicable 

## 2013-03-25 DIAGNOSIS — M5412 Radiculopathy, cervical region: Secondary | ICD-10-CM | POA: Diagnosis present

## 2013-03-25 MED ORDER — OXYCODONE-ACETAMINOPHEN 5-325 MG PO TABS: 1.0000 | ORAL_TABLET | Freq: Four times a day (QID) | ORAL | Status: DC | PRN

## 2013-03-25 MED ORDER — DOCUSATE SODIUM 100 MG PO CAPS: 100.0000 mg | ORAL_CAPSULE | Freq: Two times a day (BID) | ORAL | Status: DC

## 2013-03-25 MED ORDER — POLYETHYLENE GLYCOL 3350 17 G PO PACK: 17.0000 g | PACK | Freq: Every day | ORAL | Status: DC

## 2013-03-25 MED ORDER — ONDANSETRON 4 MG PO TBDP: 4.0000 mg | ORAL_TABLET | Freq: Three times a day (TID) | ORAL | Status: DC | PRN

## 2013-03-25 MED ORDER — METHOCARBAMOL 500 MG PO TABS: 500.0000 mg | ORAL_TABLET | Freq: Four times a day (QID) | ORAL | Status: DC | PRN

## 2013-03-25 NOTE — Progress Notes (Addendum)
Subjective: Doing well.  Some soreness with swallowing but was able to eat all of his breakfast.  No dyspnea.     Objective: Vital signs in last 24 hours: Temp:  [97.5 F (36.4 C)-98.9 F (37.2 C)] 98.3 F (36.8 C) (01/15 0438) Pulse Rate:  [71-97] 94 (01/15 0438) Resp:  [9-26] 18 (01/15 0438) BP: (143-193)/(84-110) 143/86 mmHg (01/15 0438) SpO2:  [98 %-100 %] 99 % (01/15 0438)  Intake/Output from previous day: 01/14 0701 - 01/15 0700 In: 3400 [P.O.:1200; I.V.:2200] Out: 950 [Urine:900; Blood:50] Intake/Output this shift:     Recent Labs  03/23/13 1500  HGB 14.6    Recent Labs  03/23/13 1500  WBC 8.7  RBC 4.93  HCT 41.7  PLT 252    Recent Labs  03/23/13 1500  NA 140  K 4.4  CL 102  CO2 27  BUN 11  CREATININE 1.02  GLUCOSE 94  CALCIUM 9.4   No results found for this basename: LABPT, INR,  in the last 72 hours  Exam:  Dressing c/d/i.  Neurologically intact throughout.    Assessment/Plan: D/c home today as long as he has a safe ride home.  Scripts on chart.  PT for ambulation.     OWENS,[name] M 03/25/2013, 8:52 AM     xrays satisfactory Dressings C/D/I Doing well Ok for d/c to home

## 2013-03-25 NOTE — Op Note (Signed)
NAMEMarland Kitchen  Paul Brady NO.:  [id number]  MEDICAL RECORD NO.:  [id number]  LOCATION:  [id number]                        FACILITY:  [facility]  PHYSICIAN:  Dahlia Bailiff, MD    DATE OF BIRTH:  Feb 22, 1956  DATE OF PROCEDURE:  03/24/2013 DATE OF DISCHARGE:                              OPERATIVE REPORT   PREOPERATIVE DIAGNOSIS:  Cervical spondylotic radiculopathy, C5-6, C6-7 with left radicular arm pain.  POSTOPERATIVE DIAGNOSIS:  Cervical spondylotic radiculopathy, C5-6, C6-7 with left radicular arm pain.  OPERATIVE PROCEDURE:  Anterior cervical diskectomy and fusion, C5-6, C6- 7.  INSTRUMENTATION SYSTEM USED:  DePuy anterior cervical Skyline plate affixed into the bodies with 14-mm screws at C6 and C7, and 16-mm screws into C5, with eight large Titan titanium intervertebral cage, packed with DBX mix.  FIRST ASSISTANT:  Alyson Locket. Ricard Dillon, PA  HISTORY:  This is a very pleasant gentleman who has been complaining of significant neck and radicular left arm pain.  He is only relieved albeit temporary was with a C6-C7 selective nerve root injection.  The patient had foraminal stenosis on the left side, causing nerve root irritation.  As a result of the failure to improve with conservative management, we elected to proceed with surgery.  All appropriate risks, benefits, and alternatives were discussed with the patient, and consent was obtained.  OPERATIVE NOTE:  The patient was brought to the operating room and placed supine on the operating table.  After successful induction of general anesthesia and endotracheal intubation, TEDs, SCDs were applied. Roll towels placed between the shoulder blades.  The anterior cervical spine was prepped and draped in standard fashion.  Time-out was taken, confirming the patient, procedure, and all other pertinent important data.  Once this was completed, C-arm was taken to confirm the incision site and infiltrated with 0.25% Marcaine.  Once  this was done, a transverse incision was made, starting in the midline, proceeding to the left.  Sharp dissection was carried out down to the platysma.  Platysma was sharply incised and exposed the underlying deep cervical fascia.  I then dissected along the medial border of the sternocleidomastoid through the deep cervical fascia.  I swept the omohyoid to the right and elected not to sacrifice.  I then continued my dissection deeper into the prevertebral fascia.  I then could manually palpate the anterior cervical spine.  I then bluntly dissected the remainder of the way and then placed a retractor.  I then used Engineering geologist to remove the remaining prevertebral fascia to expose C5-C7.  I then placed a needle into the C5-6 disk space, took a lateral x-ray, and confirmed that I was at the appropriate level.  Once this was done, I used bipolar electrocautery to mobilize the longus coli muscles from the midbody of C5 to the midbody of C7.  Once this was completed, self-retaining retractors were placed underneath the longus colli muscle.  The endotracheal cuff was deflated, and I expanded the retractors to the appropriate width and then reinflated the cuff.  Distraction pins were then placed into the bodies of C5 and C6, and I gently distracted the space.  I then incised the annulus with a 15-blade scalpel and  then removed the bulk of the disk material with pituitary rongeurs and curettes.  I then trimmed down the overhanging edge of the osteophyte from C5 with a 2 and 3 mm Kerrison rongeur.  I then continued my dissection inferiorly through into the disk space with curettes.  I then placed a lamina spreader and the disk space, expanded it, and then kept my distraction with the distraction pins.  I worked all the way posteriorly until I was at the posterior vertebral body.  Using a 1-mm Kerrison, I resected the osteophytes from the posterior aspect of the C5 and C6 vertebral bodies.  I  then went under the uncovertebral joint and removed and decompressed and the bone spur at this level as well.  At this point, I was pleased with my decompression.  On the left-hand side, I spent extra time, making sure I had an adequate decompression and I removed the spondylitic osteophyte.  Once this was done, I then rasped the endplates, made sure I had bleeding subchondral bone and irrigated the wound.  I used trial devices and elected to use the 8 large Titan titanium cage, packed with DBX mix.  This was obtained and malleted to the appropriate depth.  I had excellent purchase.  I then repositioned the distraction pins into the body from C5 into C7 and then distracted the C6-7 disk space.  Using the same technique, I performed a similar diskectomy at C6-7.  Again, I went into the left lateral posterior border to make sure that I had an adequate decompression on the bone spur.  Once I completed diskectomy, I trialed to rasp the endplates and placed the same size implant in.  At this point, with both implants in place, I irrigated the wound copiously with normal saline.  I removed the distraction pins and then obtained a 36-mm anterior cervical Synthes plate.  I contoured it and then applied it to the anterior surface of the cervical spine, and affixed it with self-drilling screws.  All screws had excellent purchase.  Once all screws were properly in position, I then locked them according to manufacturer's standards.  Once the locking device was in place, I irrigated again and took final x-rays.  X-rays were satisfactory.  Hardware and graft were in good position.  There was no adverse intraoperative events.  At the end of the case, all needle and sponge counts were correct.  After irrigating, I closed the platysma with interrupted 2-0 Vicryl sutures and the skin with 3-0 Monocryl. Steri-Strips and dry dressing were applied.  The patient was ultimately extubated and transferred to  the PACU without incident.  First assistant was Benjiman Core, he helped with retraction, irrigation, and closure.  The patient was transferred to the PACU without incident.     Dahlia Bailiff, MD     DDB/MEDQ  D:  03/24/2013  T:  03/25/2013  Job:  [id number]

## 2013-03-25 NOTE — Progress Notes (Signed)
Pt discharged to home. D/c instructions given, no questions verbalized. Vitals stable, pt ambulatory.

## 2013-03-25 NOTE — Evaluation (Signed)
Physical Therapy Evaluation Patient Details Name: Paul Brady MRN: [id number] DOB: 12/09/1955 Today's Date: 03/25/2013 Time: 3007-6226 PT Time Calculation (min): 29 min  PT Assessment / Plan / Recommendation History of Present Illness  s/p ACDF  Clinical Impression  Patient evaluated by Physical Therapy with no further acute PT needs identified. All education has been completed and the patient has no further questions. At the time of PT eval pt was modified independent with functional mobility, and anticipate an equal level of independence with ambulation when pt receives a cane. See below for any follow-up Physial Therapy or equipment needs. PT is signing off. Thank you for this referral.    PT Assessment  Patent does not need any further PT services    Follow Up Recommendations  Outpatient PT (If appropriate per post-op protocol)    Does the patient have the potential to tolerate intense rehabilitation      Barriers to Discharge        Equipment Recommendations  Cane (Pt is 6'2")    Recommendations for Other Services     Frequency      Precautions / Restrictions Precautions Precautions: Cervical Precaution Comments: OT educated and provided handout Required Braces or Orthoses: Cervical Brace Cervical Brace: Hard collar Restrictions Weight Bearing Restrictions: No   Pertinent Vitals/Pain Pt reports no pain throughout session.       Mobility  Bed Mobility Overal bed mobility: Modified Independent General bed mobility comments: instructed in log roll. Transfers Overall transfer level: Modified independent General transfer comment: VC's for safety Ambulation/Gait Ambulation/Gait assistance: Supervision Ambulation Distance (Feet): 100 Feet Assistive device: None Gait Pattern/deviations: Step-through pattern;Decreased stride length Gait velocity: Decreased Gait velocity interpretation: Below normal speed for age/gender General Gait Details: VC's for improved  posture. Pt reports ambulating slower than normal and feeling unsteady. Cane appropriate for d/c.     Exercises     PT Diagnosis:    PT Problem List:   PT Treatment Interventions:       PT Goals(Current goals can be found in the care plan section) Acute Rehab PT Goals Patient Stated Goal: home PT Goal Formulation: With patient Time For Goal Achievement: 04/01/13 Potential to Achieve Goals: Good  Visit Information  Last PT Received On: 03/25/13 History of Present Illness: s/p ACDF       Prior Functioning  Home Living Family/patient expects to be discharged to:: Private residence Living Arrangements: Spouse/significant other Available Help at Discharge: Available PRN/intermittently Type of Home: Apartment Home Access: Level entry Home Layout: One level Home Equipment: None Prior Function Level of Independence: Independent Comments: on disability, does not drive Communication Communication: No difficulties Dominant Hand: Right    Cognition  Cognition Arousal/Alertness: Awake/alert Behavior During Therapy: WFL for tasks assessed/performed Overall Cognitive Status: Within Functional Limits for tasks assessed    Extremity/Trunk Assessment Upper Extremity Assessment Upper Extremity Assessment: Defer to OT evaluation RUE Deficits / Details: s/p carpal tunnel sx and wrist fx RUE Sensation:  (numbness on ulnar side of hand) Lower Extremity Assessment Lower Extremity Assessment: Overall WFL for tasks assessed Cervical / Trunk Assessment Cervical / Trunk Assessment: Other exceptions (In hard collar)   Balance Balance Overall balance assessment: No apparent balance deficits (not formally assessed)  End of Session PT - End of Session Equipment Utilized During Treatment: Gait belt;Cervical collar Activity Tolerance: Patient tolerated treatment well Patient left: in chair;with call bell/phone within reach Nurse Communication: Mobility status  GP Functional Assessment Tool  Used: Clinical judgement Functional Limitation: Mobility: Walking and moving  around Mobility: Walking and Moving Around Current Status [phone]): At least 1 percent but less than 20 percent impaired, limited or restricted Mobility: Walking and Moving Around Goal Status [phone]): At least 1 percent but less than 20 percent impaired, limited or restricted Mobility: Walking and Moving Around Discharge Status [phone]): At least 1 percent but less than 20 percent impaired, limited or restricted   Jolyn Lent 03/25/2013, 12:20 PM  Jolyn Lent, [place], DPT [phone]

## 2013-03-25 NOTE — Progress Notes (Signed)
Utilization review completed.  

## 2013-03-25 NOTE — Care Management Note (Signed)
CARE MANAGEMENT NOTE 03/25/2013  Patient:  Paul Brady, Paul Brady   Account Number:  [id number]  Date Initiated:  03/25/2013  Documentation initiated by:  Ricki Miller  Subjective/Objective Assessment:   58 yr old male s/p C5-7 ACDF     Action/Plan:   Patient will need Cane   Anticipated DC Date:  03/25/2013   Anticipated DC Plan:  [place]  CM consult      PAC Choice  DURABLE MEDICAL EQUIPMENT   Choice offered to / List presented to:     DME arranged  CANE      DME agency  [place] arranged  NA      [place] agency  NA   Status of service:  Completed, signed off  Discharge Disposition:  HOME/SELF CARE

## 2013-03-25 NOTE — Evaluation (Signed)
Occupational Therapy Evaluation Patient Details Name: Angell Honse MRN: [id number] DOB: 28-Jul-1955 Today's Date: 03/25/2013 Time: 1245-8099 OT Time Calculation (min): 15 min  OT Assessment / Plan / Recommendation History of present illness s/p ACDF   Clinical Impression   Pt educated in cervical precautions with bed mobility and ADL/IADL, handout provided.  Instructed in donning, doffing and changing of pads of cervical collar.  No further OT needs.    OT Assessment  Patient does not need any further OT services    Follow Up Recommendations  No OT follow up    Barriers to Discharge      Equipment Recommendations  None recommended by OT    Recommendations for Other Services    Frequency       Precautions / Restrictions Precautions Precautions: Cervical Precaution Comments: educated and provided handout Required Braces or Orthoses: Cervical Brace Cervical Brace: Hard collar Restrictions Weight Bearing Restrictions: No   Pertinent Vitals/Pain Sore throat, offered cold drink, VSS    ADL  Eating/Feeding: Independent Where Assessed - Eating/Feeding: Bed level Grooming: Wash/dry hands;Independent Where Assessed - Grooming: Unsupported standing Upper Body Bathing: Independent Where Assessed - Upper Body Bathing: Unsupported standing Lower Body Bathing: Modified independent Where Assessed - Lower Body Bathing: Unsupported standing Upper Body Dressing: Independent Where Assessed - Upper Body Dressing: Unsupported sitting Lower Body Dressing: Modified independent Where Assessed - Lower Body Dressing: Unsupported sitting;Unsupported standing Toilet Transfer: Programmer, applications Method: Sit to Loss adjuster, chartered: Comfort height toilet Toileting - Clothing Manipulation and Hygiene: Independent Where Assessed - [place] and Hygiene: Standing Transfers/Ambulation Related to ADLs: independent within room ADL Comments: instructed in  cervical precautions related to bathing, dressing and IADL.  Practiced donning and doffing cervical brace.    OT Diagnosis:    OT Problem List:   OT Treatment Interventions:     OT Goals(Current goals can be found in the care plan section) Acute Rehab OT Goals Patient Stated Goal: home  Visit Information  Last OT Received On: 03/25/13 History of Present Illness: s/p ACDF       Prior Functioning     Home Living Family/patient expects to be discharged to:: Private residence Living Arrangements: Spouse/significant other Type of Home: Apartment Home Access: Level entry Home Layout: One level Home Equipment: None Prior Function Level of Independence: Independent Comments: on disability, does not drive Communication Communication: No difficulties Dominant Hand: Right         Vision/Perception Vision - History Patient Visual Report: No change from baseline   Cognition  Cognition Arousal/Alertness: Awake/alert Behavior During Therapy: WFL for tasks assessed/performed Overall Cognitive Status: Within Functional Limits for tasks assessed    Extremity/Trunk Assessment Upper Extremity Assessment Upper Extremity Assessment: RUE deficits/detail RUE Deficits / Details: s/p carpal tunnel sx and wrist fx RUE Sensation:  (numbness on ulnar side of hand) Lower Extremity Assessment Lower Extremity Assessment: Overall WFL for tasks assessed Cervical / Trunk Assessment Cervical / Trunk Assessment: Normal     Mobility Bed Mobility Overal bed mobility: Modified Independent General bed mobility comments: instructed in log roll. Transfers General transfer comment: modified independent     Exercise     Balance Balance Overall balance assessment: Independent   End of Session OT - End of Session Equipment Utilized During Treatment: Cervical collar Activity Tolerance: Patient tolerated treatment well Patient left: in chair;with call bell/phone within reach  GO Functional  Assessment Tool Used: clinical judgement Functional Limitation: Self care Self Care Current Status ([id number]): At least 1  percent but less than 20 percent impaired, limited or restricted Self Care Goal Status (U4403): At least 1 percent but less than 20 percent impaired, limited or restricted Self Care Discharge Status [phone]): At least 1 percent but less than 20 percent impaired, limited or restricted   Malka So 03/25/2013, 9:33 AM [phone]

## 2013-03-26 ENCOUNTER — Encounter (HOSPITAL_COMMUNITY): Payer: Self-pay | Admitting: Orthopedic Surgery

## 2013-04-19 NOTE — Discharge Summary (Signed)
Patient ID: Paul Brady MRN: [id number] DOB/AGE: [birth year] 58 y.o.  Admit date: 03/24/2013 Discharge date: 04/19/2013  Admission Diagnoses:  Active Problems:   Neck pain   Cervical radicular pain   Discharge Diagnoses:  Active Problems:   Neck pain   Cervical radicular pain  status post Procedure(s): ACDF C5 - C7 2 LEVELS  Past Medical History  Diagnosis Date  . Depression   . Hypertension   . Hyperlipidemia   . Chest pain   . Shortness of breath   . GERD (gastroesophageal reflux disease)   . Headache(784.0)     Surgeries: Procedure(s): ACDF C5 - C7 2 LEVELS on 03/24/2013   Consultants:  none  Discharged Condition: Improved  Hospital Course: Paul Brady is an 58 y.o. male who was admitted 03/24/2013 for operative treatment of <principal problem not specified>. Patient failed conservative treatments (please see the history and physical for the specifics) and had severe unremitting pain that affects sleep, daily activities and work/hobbies. After pre-op clearance, the patient was taken to the operating room on 03/24/2013 and underwent  Procedure(s): ACDF C5 - C7 2 LEVELS.    Patient was given perioperative antibiotics:  Anti-infectives   Start     Dose/Rate Route Frequency Ordered Stop   03/24/13 1900  ceFAZolin (ANCEF) IVPB 1 g/50 mL premix     1 g 100 mL/hr over 30 Minutes Intravenous Every 8 hours 03/24/13 1816 03/25/13 0423   03/23/13 1348  ceFAZolin (ANCEF) IVPB 2 g/50 mL premix     2 g 100 mL/hr over 30 Minutes Intravenous 30 min pre-op 03/23/13 1348 03/24/13 1330       Patient was given sequential compression devices and early ambulation to prevent DVT.   Patient benefited maximally from hospital stay and there were no complications. At the time of discharge, the patient was urinating/moving their bowels without difficulty, tolerating a regular diet, pain is controlled with oral pain medications and they have been cleared by PT/OT.   Recent vital  signs: No data found.    Recent laboratory studies: No results found for this basename: WBC, HGB, HCT, PLT, NA, K, CL, CO2, BUN, CREATININE, GLUCOSE, PT, INR, CALCIUM, 2,  in the last 72 hours   Discharge Medications:     Medication List         ALPRAZolam 0.25 MG tablet  Commonly known as:  XANAX  Take 0.25 mg by mouth 2 (two) times daily as needed for anxiety.     amLODipine 5 MG tablet  Commonly known as:  NORVASC  Take 5 mg by mouth daily.     docusate sodium 100 MG capsule  Commonly known as:  COLACE  Take 1 capsule (100 mg total) by mouth 2 (two) times daily.     FLUoxetine 10 MG capsule  Commonly known as:  PROZAC  Take 10 mg by mouth daily.     methocarbamol 500 MG tablet  Commonly known as:  ROBAXIN  Take 1 tablet (500 mg total) by mouth every 6 (six) hours as needed for muscle spasms.     ondansetron 4 MG disintegrating tablet  Commonly known as:  ZOFRAN ODT  Take 1 tablet (4 mg total) by mouth every 8 (eight) hours as needed for nausea or vomiting.     oxyCODONE-acetaminophen 5-325 MG per tablet  Commonly known as:  ROXICET  Take 1-2 tablets by mouth every 6 (six) hours as needed for severe pain.     polyethylene glycol packet  Commonly  known as:  MIRALAX / GLYCOLAX  Take 17 g by mouth daily.     QUEtiapine 400 MG tablet  Commonly known as:  SEROQUEL  Take 400 mg by mouth at bedtime.        Diagnostic Studies: Dg Cervical Spine 2 Or 3 Views  03/24/2013   CLINICAL DATA:  Postop ACDF C5-6 and C6-7.  EXAM: CERVICAL SPINE - 2-3 VIEW 03/24/2013 2019 hr:  COMPARISON:  Intraoperative cervical spine images earlier same date at 1545 hr.  FINDINGS: ACDF with hardware C5 through C7 with metal interbody fusion plugs in the C5-6 and C6-7 disc spaces. Anatomic alignment. Fusion plugs appropriately positioned in the disc spaces. No acute complicating features. Examination performed in cervical collar, likely accounting for the straightening of the usual cervical  lordosis.  IMPRESSION: No complicating features post ACDF C5 through C7 with interbody fusion plugs in the C5-6 and C6-7 disc spaces.   Electronically Signed   By: Evangeline Dakin M.D.   On: 03/24/2013 20:25   Dg Cervical Spine 2-3 Views  03/24/2013   CLINICAL DATA:  Cervical fusion  EXAM: DG C-ARM 1-60 MIN; CERVICAL SPINE - 2-3 VIEW  COMPARISON:  03/23/2013  FINDINGS: Anterior plate and screws with interposed disc spacers are present at C5, C6, and C7. Anatomic alignment of the vertebral bodies. No breakage or loosening of the hardware.  IMPRESSION: Anterior discectomy and fusion C5 through C7 anatomically aligned.   Electronically Signed   By: Maryclare Bean M.D.   On: 03/24/2013 16:09   Dg Cervical Spine 2 Or 3 Views  03/23/2013   CLINICAL DATA:  Preop fusion.  EXAM: CERVICAL SPINE - 2-3 VIEW  COMPARISON:  None.  FINDINGS: Vertebral body alignment and heights are within normal. There is mild to moderate spondylosis present. There is disc space narrowing at the C6-7 level. Prevertebral soft tissues are within normal. Uncovertebral joint spurring is present.  IMPRESSION: Mild spondylosis with disc disease at the C6-7 level.   Electronically Signed   By: Marin Olp M.D.   On: 03/23/2013 16:52   Dg C-arm 1-60 Min  03/24/2013   CLINICAL DATA:  Cervical fusion  EXAM: DG C-ARM 1-60 MIN; CERVICAL SPINE - 2-3 VIEW  COMPARISON:  03/23/2013  FINDINGS: Anterior plate and screws with interposed disc spacers are present at C5, C6, and C7. Anatomic alignment of the vertebral bodies. No breakage or loosening of the hardware.  IMPRESSION: Anterior discectomy and fusion C5 through C7 anatomically aligned.   Electronically Signed   By: Maryclare Bean M.D.   On: 03/24/2013 16:09        Discharge Orders   Future Orders Complete By Expires   Call MD / Call 911  As directed    Comments:     If you experience chest pain or shortness of breath, CALL 911 and be transported to the hospital emergency room.  If you develope a  fever above 101 F, pus (white drainage) or increased drainage or redness at the wound, or calf pain, call your surgeon's office.   Constipation Prevention  As directed    Comments:     Drink plenty of fluids.  Prune juice may be helpful.  You may use a stool softener, such as Colace (over the counter) 100 mg twice a day.  Use MiraLax (over the counter) for constipation as needed.   Diet - low sodium heart healthy  As directed    Discharge instructions  As directed    Comments:  Ok to shower 5 days postop.  Do not apply any creams or ointments to incision.  Do not remove steri-strips.  Can use 4x4 gauze and tape for dressing changes.  No aggressive activity. Cervical collar must be on at all times except when showering.  Do not bend or turn neck.  Ok to shower 5 days postop.  Do not apply any creams or ointments to incision.  Do not remove steri-strips.  Can use 4x4 gauze and tape for dressing changes.  No aggressive activity. Cervical collar must be on at all times except when showering.  Do not bend or turn neck.   Driving restrictions  As directed    Comments:     No driving until further notice.   Increase activity slowly as tolerated  As directed    Lifting restrictions  As directed    Comments:     No lifting until further notice.      Follow-up Information   Schedule an appointment as soon as possible for a visit with Dahlia Bailiff, MD. (NEED RETURN OFFICE VISIT 2 WEEKS POSTOP)    Specialty:  Orthopedic Surgery   Contact information:   [address] [place] [address] [place] [address] [phone]       Discharge Plan:  discharge to home      Signed: Lanae Crumbly for Dr. Melina Schools [facility] [phone] 04/19/2013, 2:29 PM

## 2013-04-20 NOTE — Discharge Summary (Signed)
Agree with above 

## 2013-08-25 ENCOUNTER — Encounter (HOSPITAL_COMMUNITY): Payer: Self-pay | Admitting: Emergency Medicine

## 2013-08-25 ENCOUNTER — Observation Stay (HOSPITAL_COMMUNITY)
Admission: EM | Admit: 2013-08-25 | Discharge: 2013-08-26 | Disposition: A | Discharge status: Home / Self Care | Payer: Medicare Other | Attending: Cardiovascular Disease | Admitting: Cardiovascular Disease

## 2013-08-25 DIAGNOSIS — F17200 Nicotine dependence, unspecified, uncomplicated: Secondary | ICD-10-CM | POA: Insufficient documentation

## 2013-08-25 DIAGNOSIS — F3289 Other specified depressive episodes: Secondary | ICD-10-CM | POA: Insufficient documentation

## 2013-08-25 DIAGNOSIS — R42 Dizziness and giddiness: Secondary | ICD-10-CM | POA: Insufficient documentation

## 2013-08-25 DIAGNOSIS — I209 Angina pectoris, unspecified: Principal | ICD-10-CM | POA: Insufficient documentation

## 2013-08-25 DIAGNOSIS — R0602 Shortness of breath: Secondary | ICD-10-CM | POA: Insufficient documentation

## 2013-08-25 DIAGNOSIS — F329 Major depressive disorder, single episode, unspecified: Secondary | ICD-10-CM | POA: Diagnosis not present

## 2013-08-25 DIAGNOSIS — K219 Gastro-esophageal reflux disease without esophagitis: Secondary | ICD-10-CM | POA: Insufficient documentation

## 2013-08-25 DIAGNOSIS — E785 Hyperlipidemia, unspecified: Secondary | ICD-10-CM | POA: Diagnosis not present

## 2013-08-25 DIAGNOSIS — I200 Unstable angina: Secondary | ICD-10-CM | POA: Diagnosis not present

## 2013-08-25 DIAGNOSIS — Z79899 Other long term (current) drug therapy: Secondary | ICD-10-CM | POA: Insufficient documentation

## 2013-08-25 DIAGNOSIS — I10 Essential (primary) hypertension: Secondary | ICD-10-CM | POA: Diagnosis not present

## 2013-08-25 DIAGNOSIS — R61 Generalized hyperhidrosis: Secondary | ICD-10-CM | POA: Insufficient documentation

## 2013-08-25 DIAGNOSIS — E43 Unspecified severe protein-calorie malnutrition: Secondary | ICD-10-CM | POA: Insufficient documentation

## 2013-08-25 DIAGNOSIS — R079 Chest pain, unspecified: Secondary | ICD-10-CM | POA: Diagnosis present

## 2013-08-25 NOTE — ED Notes (Addendum)
Pt to ED via EMS with c/o pressure chest pain and hypertension (initial 180/110, pt has not taking BP med for five days) with SOB, dizziness, headache and nausea, onset this evening. Per EMS, BP-134/90 after sublingual nitro x1, given 324mg  ASA, 4mg  zofran. Pt rate chest pain 5/10, CBG-79.

## 2013-08-26 ENCOUNTER — Observation Stay (HOSPITAL_COMMUNITY): Payer: Medicare Other

## 2013-08-26 ENCOUNTER — Encounter (HOSPITAL_COMMUNITY): Payer: Self-pay | Admitting: General Practice

## 2013-08-26 ENCOUNTER — Emergency Department (HOSPITAL_COMMUNITY): Payer: Medicare Other

## 2013-08-26 DIAGNOSIS — R42 Dizziness and giddiness: Secondary | ICD-10-CM | POA: Diagnosis not present

## 2013-08-26 DIAGNOSIS — I200 Unstable angina: Secondary | ICD-10-CM | POA: Diagnosis not present

## 2013-08-26 DIAGNOSIS — I209 Angina pectoris, unspecified: Secondary | ICD-10-CM | POA: Diagnosis not present

## 2013-08-26 DIAGNOSIS — R079 Chest pain, unspecified: Secondary | ICD-10-CM | POA: Diagnosis present

## 2013-08-26 DIAGNOSIS — R0602 Shortness of breath: Secondary | ICD-10-CM | POA: Diagnosis not present

## 2013-08-26 DIAGNOSIS — E43 Unspecified severe protein-calorie malnutrition: Secondary | ICD-10-CM | POA: Insufficient documentation

## 2013-08-26 LAB — CBC
HCT: 43.1 % (ref 39.0–52.0)
Hemoglobin: 14.8 g/dL (ref 13.0–17.0)
MCH: 29.1 pg (ref 26.0–34.0)
MCHC: 34.3 g/dL (ref 30.0–36.0)
MCV: 84.7 fL (ref 78.0–100.0)
Platelets: 245 10*3/uL (ref 150–400)
RBC: 5.09 MIL/uL (ref 4.22–5.81)
RDW: 14.6 % (ref 11.5–15.5)
WBC: 11.4 10*3/uL — ABNORMAL HIGH (ref 4.0–10.5)

## 2013-08-26 LAB — COMPREHENSIVE METABOLIC PANEL
ALK PHOS: 81 U/L (ref 39–117)
ALT: 10 U/L (ref 0–53)
AST: 15 U/L (ref 0–37)
Albumin: 4.1 g/dL (ref 3.5–5.2)
BILIRUBIN TOTAL: 0.4 mg/dL (ref 0.3–1.2)
BUN: 11 mg/dL (ref 6–23)
CHLORIDE: 103 meq/L (ref 96–112)
CO2: 26 meq/L (ref 19–32)
Calcium: 9.8 mg/dL (ref 8.4–10.5)
Creatinine, Ser: 0.96 mg/dL (ref 0.50–1.35)
GLUCOSE: 94 mg/dL (ref 70–99)
POTASSIUM: 3.9 meq/L (ref 3.7–5.3)
SODIUM: 142 meq/L (ref 137–147)
Total Protein: 7.5 g/dL (ref 6.0–8.3)

## 2013-08-26 LAB — I-STAT TROPONIN, ED: Troponin i, poc: 0 ng/mL (ref 0.00–0.08)

## 2013-08-26 LAB — TROPONIN I

## 2013-08-26 MED ORDER — CARVEDILOL 3.125 MG PO TABS: 3.1250 mg | ORAL_TABLET | Freq: Two times a day (BID) | ORAL | Status: DC

## 2013-08-26 MED ORDER — ONDANSETRON HCL 4 MG/2ML IJ SOLN: 4.0000 mg | Freq: Four times a day (QID) | INTRAMUSCULAR | Status: DC | PRN

## 2013-08-26 MED ORDER — REGADENOSON 0.4 MG/5ML IV SOLN
INTRAVENOUS | Status: AC
  Administered 2013-08-26: 0.4 mg via INTRAVENOUS
  Filled 2013-08-26: qty 5

## 2013-08-26 MED ORDER — CARVEDILOL 3.125 MG PO TABS
3.1250 mg | ORAL_TABLET | Freq: Two times a day (BID) | ORAL | Status: DC
  Administered 2013-08-26 (×2): 3.125 mg via ORAL
  Filled 2013-08-26 (×3): qty 1

## 2013-08-26 MED ORDER — QUETIAPINE FUMARATE 400 MG PO TABS
400.0000 mg | ORAL_TABLET | Freq: Every day | ORAL | Status: DC
  Filled 2013-08-26: qty 1

## 2013-08-26 MED ORDER — ALPRAZOLAM 0.25 MG PO TABS: 0.2500 mg | ORAL_TABLET | Freq: Two times a day (BID) | ORAL | Status: DC | PRN

## 2013-08-26 MED ORDER — REGADENOSON 0.4 MG/5ML IV SOLN
0.4000 mg | Freq: Once | INTRAVENOUS | Status: AC
  Administered 2013-08-26: 0.4 mg via INTRAVENOUS

## 2013-08-26 MED ORDER — ASPIRIN 300 MG RE SUPP: 300.0000 mg | RECTAL | Status: DC

## 2013-08-26 MED ORDER — NITROGLYCERIN 0.4 MG SL SUBL: 0.4000 mg | SUBLINGUAL_TABLET | SUBLINGUAL | Status: DC | PRN

## 2013-08-26 MED ORDER — SODIUM CHLORIDE 0.9 % IV SOLN
250.0000 mL | INTRAVENOUS | Status: DC | PRN
Start: 2013-08-26 — End: 2013-08-26

## 2013-08-26 MED ORDER — ENSURE COMPLETE PO LIQD
237.0000 mL | Freq: Two times a day (BID) | ORAL | Status: DC
  Administered 2013-08-26: 237 mL via ORAL

## 2013-08-26 MED ORDER — POLYETHYLENE GLYCOL 3350 17 G PO PACK
17.0000 g | PACK | Freq: Every day | ORAL | Status: DC
  Filled 2013-08-26: qty 1

## 2013-08-26 MED ORDER — OXYCODONE-ACETAMINOPHEN 5-325 MG PO TABS: 1.0000 | ORAL_TABLET | Freq: Four times a day (QID) | ORAL | Status: DC | PRN

## 2013-08-26 MED ORDER — TECHNETIUM TC 99M SESTAMIBI GENERIC - CARDIOLITE
10.0000 | Freq: Once | INTRAVENOUS | Status: AC | PRN
  Administered 2013-08-26: 10 via INTRAVENOUS

## 2013-08-26 MED ORDER — HEPARIN BOLUS VIA INFUSION
4000.0000 [IU] | Freq: Once | INTRAVENOUS | Status: AC
  Administered 2013-08-26: 4000 [IU] via INTRAVENOUS
  Filled 2013-08-26: qty 4000

## 2013-08-26 MED ORDER — HEPARIN (PORCINE) IN NACL 100-0.45 UNIT/ML-% IJ SOLN
1100.0000 [IU]/h | INTRAMUSCULAR | Status: DC
  Administered 2013-08-26: 1100 [IU]/h via INTRAVENOUS
  Filled 2013-08-26: qty 250

## 2013-08-26 MED ORDER — NITROGLYCERIN 0.4 MG SL SUBL
0.4000 mg | SUBLINGUAL_TABLET | SUBLINGUAL | Status: DC | PRN
  Administered 2013-08-26: 0.4 mg via SUBLINGUAL

## 2013-08-26 MED ORDER — TECHNETIUM TC 99M SESTAMIBI - CARDIOLITE
30.0000 | Freq: Once | INTRAVENOUS | Status: AC | PRN
  Administered 2013-08-26: 30 via INTRAVENOUS

## 2013-08-26 MED ORDER — ASPIRIN 81 MG PO CHEW: 324.0000 mg | CHEWABLE_TABLET | ORAL | Status: DC

## 2013-08-26 MED ORDER — ACETAMINOPHEN 325 MG PO TABS: 650.0000 mg | ORAL_TABLET | ORAL | Status: DC | PRN

## 2013-08-26 MED ORDER — METHOCARBAMOL 500 MG PO TABS
500.0000 mg | ORAL_TABLET | Freq: Four times a day (QID) | ORAL | Status: DC | PRN
  Filled 2013-08-26: qty 1

## 2013-08-26 MED ORDER — FLUOXETINE HCL 10 MG PO CAPS
10.0000 mg | ORAL_CAPSULE | Freq: Every day | ORAL | Status: DC
  Administered 2013-08-26: 10 mg via ORAL
  Filled 2013-08-26: qty 1

## 2013-08-26 MED ORDER — DOCUSATE SODIUM 100 MG PO CAPS
100.0000 mg | ORAL_CAPSULE | Freq: Two times a day (BID) | ORAL | Status: DC
  Filled 2013-08-26 (×2): qty 1

## 2013-08-26 MED ORDER — ASPIRIN EC 81 MG PO TBEC: 81.0000 mg | DELAYED_RELEASE_TABLET | Freq: Every day | ORAL | Status: DC

## 2013-08-26 MED ORDER — SODIUM CHLORIDE 0.9 % IJ SOLN
3.0000 mL | Freq: Two times a day (BID) | INTRAMUSCULAR | Status: DC
  Administered 2013-08-26 (×2): 3 mL via INTRAVENOUS

## 2013-08-26 MED ORDER — AMLODIPINE BESYLATE 5 MG PO TABS
5.0000 mg | ORAL_TABLET | Freq: Every day | ORAL | Status: DC
  Administered 2013-08-26: 5 mg via ORAL
  Filled 2013-08-26: qty 1

## 2013-08-26 MED ORDER — SODIUM CHLORIDE 0.9 % IJ SOLN: 3.0000 mL | INTRAMUSCULAR | Status: DC | PRN

## 2013-08-26 NOTE — Progress Notes (Signed)
Pt discharged to home per MD order. Pt received and reviewed all discharge instructions and medication information including follow-up appointments and prescription information. Pt verbalized understanding. Pt alert and oriented at discharge with no complaints of pain. Pt IV and telemetry box removed prior to discharge. Pt ambulated to private vehicle per pt request. Lenna Sciara

## 2013-08-26 NOTE — ED Notes (Addendum)
Cardiology consult at bedside, states to offer pt liquid intake.

## 2013-08-26 NOTE — Discharge Summary (Signed)
Physician Discharge Summary  Patient ID: Paul Brady MRN: [id number] DOB/AGE: 56/21/1957 56 y.o.  Admit date: 08/25/2013 Discharge date: 08/26/2013  Admission Diagnoses: Chest pain  Tobacco use disorder  Hypertension  Dyslipidemia  Discharge Diagnoses:  Principle Problem:  * Chest pain at rest * Protein-calorie malnutrition, moderate  Tobacco use disorder  Hypertension  Dyslipidemia  Discharged Condition: fair  Hospital Course: 58 year old male patient with hx of HTN, Hyperlipidemia comes in with cc of retrosternal, pressure type, non-radiating chest pain that started last evening. He underwent nuclear stress test that was negative for reversible ischemia. His medications were adjusted and he was discharged home in stable condition with follow up in 1 week by primary care and 1 month by me.  Consults: cardiology  Significant Diagnostic Studies: labs: Near normal CBC, CMET and cardiac enzymes.  Nuclear stress test:  1. Negative for pharmacologic-stress induced ischemia.  2. Left ventricular ejection fraction 61%  EKG-SR, Incomplete RBBB and LVH.  Treatments: cardiac meds: carvedilol and amlodipine  Discharge Exam: Blood pressure 120/77, pulse 73, temperature 97.4 F (36.3 C), temperature source Oral, resp. rate 18, height 6\' 2"  (1.88 m), weight 73.483 kg (162 lb), SpO2 100.00%. Physical Exam   Constitutional: He appears well-developed and poor to averagely nourished.  HENT: Head: Normocephalic and atraumatic. Brown eyes, Conjunctivae and EOM are normal. Pupils are equal, round, and reactive to light.  Neck: 50 % range of motion.  Cardiovascular: Normal rate and regular rhythm.  Pulmonary/Chest: Effort normal and breath sounds normal.  Abdominal: Soft. Bowel sounds are normal. He exhibits no distension. There is no tenderness.  Neurological: He is alert and oriented to person, place, and time. Moves all four extremities. Skin: Skin is warm.   Disposition: 01-Home or  Self Care     Medication List         ALPRAZolam 0.25 MG tablet  Commonly known as:  XANAX  Take 0.25 mg by mouth 2 (two) times daily as needed for anxiety.     amLODipine 5 MG tablet  Commonly known as:  NORVASC  Take 5 mg by mouth daily.     carvedilol 3.125 MG tablet  Commonly known as:  COREG  Take 1 tablet (3.125 mg total) by mouth 2 (two) times daily with a meal.     methocarbamol 500 MG tablet  Commonly known as:  ROBAXIN  Take 1 tablet (500 mg total) by mouth every 6 (six) hours as needed for muscle spasms.     nitroGLYCERIN 0.4 MG SL tablet  Commonly known as:  NITROSTAT  Place 1 tablet (0.4 mg total) under the tongue every 5 (five) minutes as needed for chest pain.     oxyCODONE-acetaminophen 5-325 MG per tablet  Commonly known as:  ROXICET  Take 1-2 tablets by mouth every 6 (six) hours as needed for severe pain.     QUEtiapine 400 MG tablet  Commonly known as:  SEROQUEL  Take 400 mg by mouth at bedtime.           Follow-up Information   Follow up with AVBUERE,EDWIN A, MD. Schedule an appointment as soon as possible for a visit in 1 week.   Specialty:  Internal Medicine   Contact information:   [address] [place] [address] [phone]       Follow up with [facility], MD. Schedule an appointment as soon as possible for a visit in 1 month.   Specialty:  Cardiology   Contact information:   [address]  [place] [address] [phone]       Signed: Birdie Riddle 08/26/2013, 6:33 PM

## 2013-08-26 NOTE — Progress Notes (Signed)
Echocardiogram 2D Echocardiogram has been performed.  Paul Brady 08/26/2013, 11:55 AM

## 2013-08-26 NOTE — H&P (Signed)
Referring Physician: Dixie Dials, MD/ Dr. Nita Sells, MD  Paul Brady is an 58 y.o. male.                       Chief Complaint: Chest pain HPI: 58 year old male patient with hx of HTN, HL comes in with cc of retrosternal, pressure type, non-radiating chest pain that started last evening. The pain is constant, and has no specific aggravating or relieving factors. Pt has associated dizziness and nausea. PT had sweats, but he is unsure of it was from the heat. Pt admits to smoking and having family hx of CAD. Now chest pain free.    Past Medical History  Diagnosis Date  . Depression   . Hypertension   . Hyperlipidemia   . Chest pain   . Shortness of breath   . GERD (gastroesophageal reflux disease)   . HQIONGEX(528.4)       Past Surgical History  Procedure Laterality Date  . Wrist fusion Right 04/2012  . Anterior cervical decomp/discectomy fusion N/A 03/24/2013    Procedure: ACDF C5 - C7 2 LEVELS;  Surgeon: Melina Schools, MD;  Location: [facility];  Service: Orthopedics;  Laterality: N/A;    History reviewed. No pertinent family history. Social History:  reports that he has been smoking Cigarettes.  He has a 12.5 pack-year smoking history. He has never used smokeless tobacco. He reports that he uses illicit drugs. He reports that he does not drink alcohol.  Allergies: No Known Allergies   (Not in a hospital admission)  Results for orders placed during the hospital encounter of 08/25/13 (from the past 48 hour(s))  CBC     Status: Abnormal   Collection Time    08/26/13 12:00 AM      Result Value Ref Range   WBC 11.4 (*) 4.0 - 10.5 K/uL   RBC 5.09  4.22 - 5.81 MIL/uL   Hemoglobin 14.8  13.0 - 17.0 g/dL   HCT 43.1  39.0 - 52.0 %   MCV 84.7  78.0 - 100.0 fL   MCH 29.1  26.0 - 34.0 pg   MCHC 34.3  30.0 - 36.0 g/dL   RDW 14.6  11.5 - 15.5 %   Platelets 245  150 - 400 K/uL  COMPREHENSIVE METABOLIC PANEL     Status: None   Collection Time    08/26/13 12:00 AM      Result  Value Ref Range   Sodium 142  137 - 147 mEq/L   Potassium 3.9  3.7 - 5.3 mEq/L   Chloride 103  96 - 112 mEq/L   CO2 26  19 - 32 mEq/L   Glucose, Bld 94  70 - 99 mg/dL   BUN 11  6 - 23 mg/dL   Creatinine, Ser 0.96  0.50 - 1.35 mg/dL   Calcium 9.8  8.4 - 10.5 mg/dL   Total Protein 7.5  6.0 - 8.3 g/dL   Albumin 4.1  3.5 - 5.2 g/dL   AST 15  0 - 37 U/L   ALT 10  0 - 53 U/L   Alkaline Phosphatase 81  39 - 117 U/L   Total Bilirubin 0.4  0.3 - 1.2 mg/dL   GFR calc non Af Amer >90  >90 mL/min   GFR calc Af Amer >90  >90 mL/min   Comment: (NOTE)     The eGFR has been calculated using the CKD EPI equation.     This calculation has not  been validated in all clinical situations.     eGFR's persistently <90 mL/min signify possible Chronic Kidney     Disease.  Randolm Idol, ED     Status: None   Collection Time    08/26/13 12:31 AM      Result Value Ref Range   Troponin i, poc 0.00  0.00 - 0.08 ng/mL   Comment 3            Comment: Due to the release kinetics of cTnI,     a negative result within the first hours     of the onset of symptoms does not rule out     myocardial infarction with certainty.     If myocardial infarction is still suspected,     repeat the test at appropriate intervals.   Dg Chest 2 View  08/26/2013   CLINICAL DATA:  Chest pain.  EXAM: CHEST  2 VIEW  COMPARISON:  Chest radiograph performed 08/04/2012  FINDINGS: The lungs are well-aerated. Mild chronic peribronchial thickening is noted. There is no evidence of focal opacification, pleural effusion or pneumothorax.  The heart is normal in size; the mediastinal contour is within normal limits. No acute osseous abnormalities are seen. Cervical spinal fusion hardware is partially imaged.  IMPRESSION: No acute cardiopulmonary process seen. Mild chronic peribronchial thickening seen.   Electronically Signed   By: Garald Balding M.D.   On: 08/26/2013 01:07   Review of Systems  Constitutional: Positive for diaphoresis.  Negative for fever, chills and activity change.  Eyes: Negative for visual disturbance.  Respiratory: Positive for shortness of breath. Negative for cough and chest tightness.  Cardiovascular: Positive for chest pain.  Gastrointestinal: Negative for abdominal distention.  Genitourinary: Negative for dysuria, enuresis and difficulty urinating.  Musculoskeletal: Negative for arthralgias and neck pain.  Neurological: Positive for dizziness. Negative for light-headedness and headaches.  Psychiatric/Behavioral: Negative for confusion   Blood pressure 131/87, pulse 67, temperature 98.2 F (36.8 C), temperature source Oral, resp. rate 17, SpO2 100.00%.  Physical Exam  Nursing note and vitals reviewed.  Constitutional: He appears well-developed and averagely nourished.  HENT: Head: Normocephalic and atraumatic. Brown eyes, Conjunctivae and EOM are normal. Pupils are equal, round, and reactive to light.  Neck: 50 % range of motion. Neck supple.  Cardiovascular: Normal rate and regular rhythm.  Pulmonary/Chest: Effort normal and breath sounds normal.  Abdominal: Soft. Bowel sounds are normal. He exhibits no distension. There is no tenderness. There is no rebound and no guarding.  Neurological: He is alert and oriented to person, place, and time.  Skin: Skin is warm.    Assessment/Plan Chest pain Tobacco use disorder Hypertension Dyslipidemia  Nuclear stress test in AM  [facility] S 08/26/2013, 2:13 AM

## 2013-08-26 NOTE — Progress Notes (Signed)
UR Completed Brenda Graves-Bigelow, RN,BSN [phone]  

## 2013-08-26 NOTE — Progress Notes (Signed)
ANTICOAGULATION CONSULT NOTE - Initial Consult  Pharmacy Consult for Heparin Indication: chest pain/ACS  No Known Allergies  Patient Measurements: Height: 6' 2.02" (188 cm) Weight: 200 lb 9.9 oz (91 kg) (03/2013) IBW/kg (Calculated) : 82.24   Vital Signs: Temp: 98.2 F (36.8 C) (06/17 2323) Temp src: Oral (06/17 2323) BP: 130/80 mmHg (06/18 0230) Pulse Rate: 63 (06/18 0230)  Labs:  Recent Labs  08/26/13  HGB 14.8  HCT 43.1  PLT 245  CREATININE 0.96    Estimated Creatinine Clearance: 99.9 ml/min (by C-G formula based on Cr of 0.96).   Medical History: Past Medical History  Diagnosis Date  . Depression   . Hypertension   . Hyperlipidemia   . Chest pain   . Shortness of breath   . GERD (gastroesophageal reflux disease)   . Headache(784.0)     Medications:  Prescriptions prior to admission  Medication Sig Dispense Refill  . ALPRAZolam (XANAX) 0.25 MG tablet Take 0.25 mg by mouth 2 (two) times daily as needed for anxiety.      [name] amLODipine (NORVASC) 5 MG tablet Take 5 mg by mouth daily.      . methocarbamol (ROBAXIN) 500 MG tablet Take 1 tablet (500 mg total) by mouth every 6 (six) hours as needed for muscle spasms.  60 tablet  0  . oxyCODONE-acetaminophen (ROXICET) 5-325 MG per tablet Take 1-2 tablets by mouth every 6 (six) hours as needed for severe pain.  60 tablet  0  . QUEtiapine (SEROQUEL) 400 MG tablet Take 400 mg by mouth at bedtime.        Assessment: 58 y.o. male with chest pain for heparin   Goal of Therapy:  Heparin level 0.3-0.7 units/ml Monitor platelets by anticoagulation protocol: Yes   Plan:  Heparin 4000 units IV bolus, then start heparin 1100 units/hr Check heparin level in 6 hours.   Abbott, Bronson Curb 08/26/2013,2:54 AM
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
## 2013-08-26 NOTE — Progress Notes (Signed)
INITIAL NUTRITION ASSESSMENT  DOCUMENTATION CODES Per approved criteria  -Severe malnutrition in the context of social or environmental circumstances   INTERVENTION:  Ensure Complete PO BID, each supplement provides 350 kcal and 13 grams of protein; recommend continue this regimen at home.  NUTRITION DIAGNOSIS: Malnutrition related to ongoing emotional stress as evidenced by 19% weight loss in the past 6 months with severe depletion of muscle mass.   Goal: Intake to meet >90% of estimated nutrition needs.  Monitor:  Diet advancement, PO intake, labs, weight trend.  Reason for Assessment: MST  58 y.o. male  Admitting Dx: Chest Pain  ASSESSMENT: 58 year old male patient with hx of HTN, HL comes in with chief complaint of retrosternal, pressure type, non-radiating chest pain that started last evening.   Patient reports that he has been eating poorly due to decreased appetite. He has been under a lot of stress lately due to the lady he lives with. She frustrates him so bad that he loses his appetite. He has lost a lot of weight, over 100 lbs in the past year and a half.   Patient received a prescription for Ensure from his physician a few months ago, but was told by the Pharmacy that he had to have it provided by a home health agency for insurance to cover it. Therefore, he has not been drinking any supplements at home.  He was NPO this morning for a procedure, but is now hungry.  Pt meets criteria for severe MALNUTRITION in the context of social or environmental circumstances as evidenced by 19% weight loss in the past 6 months with severe depletion of muscle mass.  Nutrition Focused Physical Exam:  Subcutaneous Fat:  Orbital Region: WNL Upper Arm Region: WNL Thoracic and Lumbar Region: WNL  Muscle:  Temple Region: moderate depletion Clavicle Bone Region: moderate depletion Clavicle and Acromion Bone Region: moderate depletion Scapular Bone Region: mild depletion Dorsal  Hand: mild depletion Patellar Region: severe depletion Anterior Thigh Region: mild depletion Posterior Calf Region: severe depletion  Edema: none   Height: Ht Readings from Last 1 Encounters:  08/26/13 6\' 2"  (1.88 m)    Weight: Wt Readings from Last 1 Encounters:  08/26/13 162 lb (73.483 kg)    Ideal Body Weight: 86.4 kg  % Ideal Body Weight: 85%  Wt Readings from Last 10 Encounters:  08/26/13 162 lb (73.483 kg)  03/25/13 201 lb (91.173 kg)  03/25/13 201 lb (91.173 kg)  03/23/13 201 lb 9.6 oz (91.445 kg)  08/05/12 196 lb 3.2 oz (88.996 kg)    Usual Body Weight: 201 lb  % Usual Body Weight: 81%  BMI:  Body mass index is 20.79 kg/(m^2).  Estimated Nutritional Needs: Kcal: 2200-2400 Protein: 120-140 gm Fluid: 2.2-2.4 L  Skin: WDL  Diet Order: Cardiac  EDUCATION NEEDS: -Education needs addressed  No intake or output data in the 24 hours ending 08/26/13 1307  Last BM: 6/17   Labs:   Recent Labs Lab 08/26/13  NA 142  K 3.9  CL 103  CO2 26  BUN 11  CREATININE 0.96  CALCIUM 9.8  GLUCOSE 94    CBG (last 3)  No results found for this basename: GLUCAP,  in the last 72 hours  Scheduled Meds: . amLODipine  5 mg Oral Daily  . [START ON 08/27/2013] aspirin EC  81 mg Oral Daily  . carvedilol  3.125 mg Oral BID WC  . docusate sodium  100 mg Oral BID  . FLUoxetine  10 mg  Oral Daily  . polyethylene glycol  17 g Oral Daily  . QUEtiapine  400 mg Oral QHS  . sodium chloride  3 mL Intravenous Q12H    Continuous Infusions:    Past Medical History  Diagnosis Date  . Depression   . Hypertension   . Hyperlipidemia   . Chest pain   . Shortness of breath   . GERD (gastroesophageal reflux disease)   . PXTGGYIR(485.4)     Past Surgical History  Procedure Laterality Date  . Wrist fusion Right 04/2012  . Anterior cervical decomp/discectomy fusion N/A 03/24/2013    Procedure: ACDF C5 - C7 2 LEVELS;  Surgeon: Melina Schools, MD;  Location: [facility];  Service:  Orthopedics;  Laterality: N/A;     Molli Barrows, RD, LDN, [place] Pager [phone] After Hours Pager [phone]

## 2013-08-26 NOTE — ED Provider Notes (Signed)
CSN: [id number]     Arrival date & time 08/25/13  2314 History   First MD Initiated Contact with Patient 08/26/13 0115     Chief Complaint  Patient presents with  . Chest Pain     (Consider location/radiation/quality/duration/timing/severity/associated sxs/prior Treatment) HPI Comments: Pt with hx of HTN, HL comes in with cc of chest pain. Pt started having chest discomfort in the middle of the chest this evening, described as pressure like. The pain is constant, and has no specific aggravating or relieving factors. Pt has associated dib, dizziness, nausea. PT had sweats, but he is unsure of it was from the heat.  Pt admits to smoking and having family hx of CAD. Pt had a stress by Dr. Doylene Canard in January, and it was normal.  Patient is a 58 y.o. male presenting with chest pain. The history is provided by the patient.  Chest Pain Associated symptoms: diaphoresis, dizziness and shortness of breath   Associated symptoms: no cough, no fever and no headache     Past Medical History  Diagnosis Date  . Depression   . Hypertension   . Hyperlipidemia   . Chest pain   . Shortness of breath   . GERD (gastroesophageal reflux disease)   . GQQPYPPJ(093.2)    Past Surgical History  Procedure Laterality Date  . Wrist fusion Right 04/2012  . Anterior cervical decomp/discectomy fusion N/A 03/24/2013    Procedure: ACDF C5 - C7 2 LEVELS;  Surgeon: Melina Schools, MD;  Location: [facility];  Service: Orthopedics;  Laterality: N/A;   History reviewed. No pertinent family history. History  Substance Use Topics  . Smoking status: Current Every Day Smoker -- 0.50 packs/day for 25 years    Types: Cigarettes  . Smokeless tobacco: Never Used  . Alcohol Use: No    Review of Systems  Constitutional: Positive for diaphoresis. Negative for fever, chills and activity change.  Eyes: Negative for visual disturbance.  Respiratory: Positive for shortness of breath. Negative for cough and chest tightness.    Cardiovascular: Positive for chest pain.  Gastrointestinal: Negative for abdominal distention.  Genitourinary: Negative for dysuria, enuresis and difficulty urinating.  Musculoskeletal: Negative for arthralgias and neck pain.  Neurological: Positive for dizziness. Negative for light-headedness and headaches.  Psychiatric/Behavioral: Negative for confusion.      Allergies  Review of patient's allergies indicates no known allergies.  Home Medications   Prior to Admission medications   Medication Sig Start Date End Date Taking? Authorizing Provider  ALPRAZolam (XANAX) 0.25 MG tablet Take 0.25 mg by mouth 2 (two) times daily as needed for anxiety.    Historical Provider, MD  amLODipine (NORVASC) 5 MG tablet Take 5 mg by mouth daily.    Historical Provider, MD  docusate sodium (COLACE) 100 MG capsule Take 1 capsule (100 mg total) by mouth 2 (two) times daily. 03/25/13   Benjiman Core, PA-C  FLUoxetine (PROZAC) 10 MG capsule Take 10 mg by mouth daily.    Historical Provider, MD  methocarbamol (ROBAXIN) 500 MG tablet Take 1 tablet (500 mg total) by mouth every 6 (six) hours as needed for muscle spasms. 03/25/13   Benjiman Core, PA-C  ondansetron (ZOFRAN ODT) 4 MG disintegrating tablet Take 1 tablet (4 mg total) by mouth every 8 (eight) hours as needed for nausea or vomiting. 03/25/13   Benjiman Core, PA-C  oxyCODONE-acetaminophen (ROXICET) 5-325 MG per tablet Take 1-2 tablets by mouth every 6 (six) hours as needed for severe pain. 03/25/13   Benjiman Core, PA-C  polyethylene glycol (MIRALAX / GLYCOLAX) packet Take 17 g by mouth daily. 03/25/13   Benjiman Core, PA-C  QUEtiapine (SEROQUEL) 400 MG tablet Take 400 mg by mouth at bedtime.    Historical Provider, MD   BP 148/93  Pulse 74  Temp(Src) 98.2 F (36.8 C) (Oral)  Resp 15  SpO2 100% Physical Exam  Nursing note and vitals reviewed. Constitutional: He is oriented to person, place, and time. He appears well-developed.  HENT:  Head: Normocephalic and  atraumatic.  Eyes: Conjunctivae and EOM are normal. Pupils are equal, round, and reactive to light.  Neck: Normal range of motion. Neck supple.  Cardiovascular: Normal rate and regular rhythm.   Pulmonary/Chest: Effort normal and breath sounds normal.  Abdominal: Soft. Bowel sounds are normal. He exhibits no distension. There is no tenderness. There is no rebound and no guarding.  Neurological: He is alert and oriented to person, place, and time.  Skin: Skin is warm.    ED Course  Procedures (including critical care time) Labs Review Labs Reviewed  CBC - Abnormal; Notable for the following:    WBC 11.4 (*)    All other components within normal limits  COMPREHENSIVE METABOLIC PANEL  Randolm Idol, ED    Imaging Review Dg Chest 2 View  08/26/2013   CLINICAL DATA:  Chest pain.  EXAM: CHEST  2 VIEW  COMPARISON:  Chest radiograph performed 08/04/2012  FINDINGS: The lungs are well-aerated. Mild chronic peribronchial thickening is noted. There is no evidence of focal opacification, pleural effusion or pneumothorax.  The heart is normal in size; the mediastinal contour is within normal limits. No acute osseous abnormalities are seen. Cervical spinal fusion hardware is partially imaged.  IMPRESSION: No acute cardiopulmonary process seen. Mild chronic peribronchial thickening seen.   Electronically Signed   By: Garald Balding M.D.   On: 08/26/2013 01:07     EKG Interpretation   Date/Time:  Wednesday August 25 2013 23:21:44 EDT Ventricular Rate:  87 PR Interval:  159 QRS Duration: 98 QT Interval:  373 QTC Calculation: 449 R Axis:   117 Text Interpretation:  Sinus rhythm Left atrial enlargement Right axis  deviation RSR' in V1 or V2, probably normal variant Nonspecific T  abnormalities, lateral leads ST elevation v3, with some changes in v1, and  v2 as well No reciprocal depression Confirmed by Kathrynn Humble, MD, ANKIT  [phone]) on 08/26/2013 1:12:20 AM       EKG Interpretation  REPEAT  EKG: Ventricular Rate:  69 PR Interval:  159 QRS Duration: 98 QT Interval:  373 QTC Calculation: 449 R Axis:   117 Text Interpretation:  Sinus rhythm Left atrial enlargement Right axis  deviation RSR' in V1 or V2, probably normal variant Nonspecific T  abnormalities, lateral leads ST elevation v3, with some changes in v1, and  v2 as well No reciprocal depression Confirmed by NANAVATI, MD, ANKIT  ([id number]) on 08/26/2013 1:12:20 AM  UNCHANGED FROM PRIOR    MDM   Final diagnoses:  Angina pectoris  Unstable angina  Pt comes in with cc of chest pain. Chest pain is described as heaviness, with some dib and nausea and dizziness. EKG has these abnormal ST elevation in lead v3 and some subtle changes in v1 and v2. Hx of HTN, HL, Smoking, fam hx of CAD. Dr. Doylene Canard is admitting.  Pt has some chest pain during our evaluation, so we will admit as unstable angina.  ASA given by EMS full dose. Deferring anticoagulation to Dr. Doylene Canard.   CRITICAL CARE  Performed by: Varney Biles   Total critical care time: 35 min  Critical care time was exclusive of separately billable procedures and treating other patients.  Critical care was necessary to treat or prevent imminent or life-threatening deterioration.  Critical care was time spent personally by me on the following activities: development of treatment plan with patient and/or surrogate as well as nursing, discussions with consultants, evaluation of patient's response to treatment, examination of patient, obtaining history from patient or surrogate, ordering and performing treatments and interventions, ordering and review of laboratory studies, ordering and review of radiographic studies, pulse oximetry and re-evaluation of patient's condition.   The patient was counseled on the dangers of tobacco use, and was advised to quit.  Reviewed strategies to maximize success, including removing cigarettes and smoking materials from environment.  Discussion 3-5 min   Varney Biles, MD 08/26/13 0200

## 2013-08-26 NOTE — Progress Notes (Signed)
Pt lexi injection with MD present

## 2013-10-02 ENCOUNTER — Encounter (HOSPITAL_COMMUNITY): Payer: Self-pay | Admitting: Emergency Medicine

## 2013-10-02 ENCOUNTER — Emergency Department (HOSPITAL_COMMUNITY)
Admission: EM | Admit: 2013-10-02 | Discharge: 2013-10-03 | Disposition: A | Discharge status: Home / Self Care | Payer: Medicare Other | Attending: Emergency Medicine | Admitting: Emergency Medicine

## 2013-10-02 DIAGNOSIS — R29898 Other symptoms and signs involving the musculoskeletal system: Secondary | ICD-10-CM

## 2013-10-02 DIAGNOSIS — F17200 Nicotine dependence, unspecified, uncomplicated: Secondary | ICD-10-CM | POA: Diagnosis not present

## 2013-10-02 DIAGNOSIS — Z862 Personal history of diseases of the blood and blood-forming organs and certain disorders involving the immune mechanism: Secondary | ICD-10-CM | POA: Diagnosis not present

## 2013-10-02 DIAGNOSIS — Z79899 Other long term (current) drug therapy: Secondary | ICD-10-CM | POA: Insufficient documentation

## 2013-10-02 DIAGNOSIS — Z8639 Personal history of other endocrine, nutritional and metabolic disease: Secondary | ICD-10-CM | POA: Diagnosis not present

## 2013-10-02 DIAGNOSIS — F3289 Other specified depressive episodes: Secondary | ICD-10-CM | POA: Insufficient documentation

## 2013-10-02 DIAGNOSIS — F329 Major depressive disorder, single episode, unspecified: Secondary | ICD-10-CM | POA: Insufficient documentation

## 2013-10-02 DIAGNOSIS — Z8719 Personal history of other diseases of the digestive system: Secondary | ICD-10-CM | POA: Diagnosis not present

## 2013-10-02 DIAGNOSIS — R209 Unspecified disturbances of skin sensation: Secondary | ICD-10-CM | POA: Diagnosis not present

## 2013-10-02 DIAGNOSIS — I10 Essential (primary) hypertension: Secondary | ICD-10-CM | POA: Insufficient documentation

## 2013-10-02 DIAGNOSIS — M6281 Muscle weakness (generalized): Secondary | ICD-10-CM | POA: Diagnosis not present

## 2013-10-02 DIAGNOSIS — R200 Anesthesia of skin: Secondary | ICD-10-CM

## 2013-10-02 DIAGNOSIS — R202 Paresthesia of skin: Secondary | ICD-10-CM

## 2013-10-02 NOTE — ED Notes (Signed)
PER EMS - pt from home with c/o numbness/tingling to L thigh, onset 0100 today.  Pt reports worsening.  Stroke scale negative.  A+Ox4.  Denies other complaints.  Pt refused IV access.

## 2013-10-02 NOTE — ED Notes (Signed)
Initial contact - pt A+Ox4, reports numbness and tingling to LLE, onset 0100 today.  Pt reports initially in small spot in his thigh and has since progressed to his lower leg.  Pt reports 6/10 pain.  Pt reports decreased sensation, +pulses, MAEI, strong and equal.  Skin PWD.  Pt denies other complaints.  NAD.

## 2013-10-02 NOTE — ED Notes (Signed)
Bed: MM38 Expected date:  Expected time:  Means of arrival:  Comments: EMS 58yo M, numbness

## 2013-10-03 ENCOUNTER — Emergency Department (HOSPITAL_COMMUNITY): Payer: Medicare Other

## 2013-10-03 DIAGNOSIS — R209 Unspecified disturbances of skin sensation: Secondary | ICD-10-CM | POA: Diagnosis not present

## 2013-10-03 LAB — CBC WITH DIFFERENTIAL/PLATELET
BASOS PCT: 0 % (ref 0–1)
Basophils Absolute: 0 10*3/uL (ref 0.0–0.1)
Eosinophils Absolute: 0.2 10*3/uL (ref 0.0–0.7)
Eosinophils Relative: 2 % (ref 0–5)
HEMATOCRIT: 39.8 % (ref 39.0–52.0)
Hemoglobin: 13.3 g/dL (ref 13.0–17.0)
Lymphocytes Relative: 42 % (ref 12–46)
Lymphs Abs: 3.6 10*3/uL (ref 0.7–4.0)
MCH: 28.4 pg (ref 26.0–34.0)
MCHC: 33.4 g/dL (ref 30.0–36.0)
MCV: 84.9 fL (ref 78.0–100.0)
MONO ABS: 0.6 10*3/uL (ref 0.1–1.0)
Monocytes Relative: 7 % (ref 3–12)
NEUTROS ABS: 4.3 10*3/uL (ref 1.7–7.7)
Neutrophils Relative %: 49 % (ref 43–77)
PLATELETS: 204 10*3/uL (ref 150–400)
RBC: 4.69 MIL/uL (ref 4.22–5.81)
RDW: 14.2 % (ref 11.5–15.5)
WBC: 8.6 10*3/uL (ref 4.0–10.5)

## 2013-10-03 LAB — URINALYSIS, ROUTINE W REFLEX MICROSCOPIC
BILIRUBIN URINE: NEGATIVE
Glucose, UA: NEGATIVE mg/dL
Hgb urine dipstick: NEGATIVE
KETONES UR: NEGATIVE mg/dL
Leukocytes, UA: NEGATIVE
NITRITE: NEGATIVE
Protein, ur: NEGATIVE mg/dL
Specific Gravity, Urine: 1.023 (ref 1.005–1.030)
UROBILINOGEN UA: 1 mg/dL (ref 0.0–1.0)
pH: 6 (ref 5.0–8.0)

## 2013-10-03 LAB — BASIC METABOLIC PANEL
ANION GAP: 10 (ref 5–15)
BUN: 12 mg/dL (ref 6–23)
CALCIUM: 9.3 mg/dL (ref 8.4–10.5)
CO2: 26 mEq/L (ref 19–32)
CREATININE: 0.96 mg/dL (ref 0.50–1.35)
Chloride: 103 mEq/L (ref 96–112)
GFR calc Af Amer: >90 mL/min (ref >90)
GFR calc non Af Amer: >90 mL/min (ref >90)
Glucose, Bld: 91 mg/dL (ref 70–99)
Potassium: 3.9 mEq/L (ref 3.7–5.3)
SODIUM: 139 meq/L (ref 137–147)

## 2013-10-03 LAB — MAGNESIUM: Magnesium: 2.2 mg/dL (ref 1.5–2.5)

## 2013-10-03 MED ORDER — LORAZEPAM 1 MG PO TABS
2.0000 mg | ORAL_TABLET | Freq: Once | ORAL | Status: AC
  Administered 2013-10-03: 2 mg via ORAL
  Filled 2013-10-03: qty 2

## 2013-10-03 MED ORDER — DOXYCYCLINE HYCLATE 100 MG PO CAPS: 100.0000 mg | ORAL_CAPSULE | Freq: Two times a day (BID) | ORAL | Status: DC

## 2013-10-03 NOTE — ED Provider Notes (Signed)
Reevaluation after return from MRI, head, with results; no acute abnormalities  Ressessment- he, states that the Ativan, help him tolerate the  MRI; findings discussed with the patient, all questions answered. The patient states that he is ready to go home. He requests a bus pass, and a cane to use for ambulation assistance. He denies bowel or urinary incontinence.  He, states, that he'll followup with his orthopedist, for further treatment of his back problems.  Assessment: Nonspecific left leg numbness and weakness with gait problem. Doubt spinal myelopathy, or other central nervous system problem.  He is stable for discharge, with his current home treatments  Richarda Blade, MD 10/03/13 1103

## 2013-10-03 NOTE — ED Notes (Signed)
He is alert and oriented; and c/o persistent numbness in ant. Left thigh plus paresthesias of left lower leg/foot.  His only other c/o is of being hungry. I explain that we are awaiting the word from our radiologist to see whether his is eligible for MRI d/t neck and wrist hardware placed in the past.

## 2013-10-03 NOTE — ED Provider Notes (Signed)
CSN: [id number]     Arrival date & time 10/02/13  2235 History   First MD Initiated Contact with Patient 10/02/13 2308     Chief Complaint  Patient presents with  . Numbness  . Tingling     (Consider location/radiation/quality/duration/timing/severity/associated sxs/prior Treatment) HPI Comments: Pt comes in with cc of L leg weakness, numbness, and tingling. PT has hx of HTN, HL. States that his sx started y'day with numbness, that progressed to entire leg numbness and tingling. Later in the evening he started having weakness and limping  - so he came to the ER. He has hx of chronic back pain - but no active flare up. No associated numbness, weakness, urinary incontinence, urinary retention, bowel incontinence, saddle anesthesia.    The history is provided by the patient.    Past Medical History  Diagnosis Date  . Depression   . Hypertension   . Hyperlipidemia   . Chest pain   . Shortness of breath   . GERD (gastroesophageal reflux disease)   . [id number](010.2)    Past Surgical History  Procedure Laterality Date  . Wrist fusion Right 04/2012  . Anterior cervical decomp/discectomy fusion N/A 03/24/2013    Procedure: ACDF C5 - C7 2 LEVELS;  Surgeon: Melina Schools, MD;  Location: [facility];  Service: Orthopedics;  Laterality: N/A;   No family history on file. History  Substance Use Topics  . Smoking status: Current Every Day Smoker -- 0.50 packs/day for 25 years    Types: Cigarettes  . Smokeless tobacco: Never Used  . Alcohol Use: No    Review of Systems  Constitutional: Negative for fever, chills, activity change, appetite change and fatigue.  Eyes: Negative for visual disturbance.  Respiratory: Negative for cough, chest tightness and shortness of breath.   Cardiovascular: Negative for chest pain.  Gastrointestinal: Negative for abdominal pain and abdominal distention.  Genitourinary: Negative for dysuria, enuresis and difficulty urinating.  Musculoskeletal: Positive for back  pain and gait problem. Negative for arthralgias and neck pain.  Neurological: Positive for weakness and numbness. Negative for dizziness, light-headedness and headaches.  Psychiatric/Behavioral: Negative for confusion.      Allergies  Review of patient's allergies indicates no known allergies.  Home Medications   Prior to Admission medications   Medication Sig Start Date End Date Taking? Authorizing Provider  ALPRAZolam (XANAX) 0.25 MG tablet Take 0.25 mg by mouth 2 (two) times daily as needed for anxiety.   Yes Historical Provider, MD  amLODipine (NORVASC) 5 MG tablet Take 5 mg by mouth daily.   Yes Historical Provider, MD  HYDROcodone-acetaminophen (NORCO) 10-325 MG per tablet Take 1 tablet by mouth every 6 (six) hours as needed for moderate pain.   Yes Historical Provider, MD  QUEtiapine (SEROQUEL) 400 MG tablet Take 400 mg by mouth at bedtime.   Yes Historical Provider, MD  doxycycline (VIBRAMYCIN) 100 MG capsule Take 1 capsule (100 mg total) by mouth 2 (two) times daily. 10/03/13   Varney Biles, MD  nitroGLYCERIN (NITROSTAT) 0.4 MG SL tablet Place 1 tablet (0.4 mg total) under the tongue every 5 (five) minutes as needed for chest pain. 08/26/13   Birdie Riddle, MD   BP 143/95  Pulse 61  Temp(Src) 97.9 F (36.6 C) (Oral)  Resp 14  SpO2 100% Physical Exam  Nursing note and vitals reviewed. Constitutional: He is oriented to person, place, and time. He appears well-developed.  HENT:  Head: Normocephalic and atraumatic.  Eyes: Conjunctivae and EOM are normal. Pupils are  equal, round, and reactive to light.  Neck: Normal range of motion. Neck supple.  Cardiovascular: Normal rate, regular rhythm and normal heart sounds.   Pulmonary/Chest: Effort normal and breath sounds normal. No respiratory distress. He has no wheezes.  Abdominal: Soft. Bowel sounds are normal. He exhibits no distension. There is no tenderness. There is no rebound and no guarding.  Neurological: He is alert and  oriented to person, place, and time. No cranial nerve deficit.  Cerebellar exam is normal (finger to nose) Sensory exam normal for bilateral upper extremity - and patient is able to discriminate between sharp and dull. Lower extremity sensory exam reveals subjective numbness and paresthesia on the left side Motor exam is 4+/5 - but patient has a limp with ambulation. Reflex is 2+   Skin: Skin is warm.    ED Course  Procedures (including critical care time) Labs Review Labs Reviewed  CBC WITH DIFFERENTIAL  BASIC METABOLIC PANEL  URINALYSIS, ROUTINE W REFLEX MICROSCOPIC  MAGNESIUM    Imaging Review No results found.   EKG Interpretation None      MDM   Final diagnoses:  Numbness and tingling of left leg  Weakness of left leg    Pt with unilateral LLE weakness, numbness and tingling. Will get MRI. The back exam is completely normal - and the sx are not consistent with sciatica.  Dr. Eulis Foster to f/u with the results. Will give him doxy given patient admits to tick exposure (no known bite) a week ago. I dont suspect his sx right now are tick bite related. Neuro f/u given.     Varney Biles, MD 10/03/13 [phone]

## 2013-10-03 NOTE — Discharge Instructions (Signed)
We saw you in the ER for the weakness, numbness, tingling in your leg. All the results in the ER are normal, labs and imaging. We are not sure what is causing your symptoms. The workup in the ER is not complete, and is limited to screening for life threatening and emergent conditions only, so please see a primary care doctor for further evaluation or see the Neurologist as requested. Take the antibiotics as prescribed for the tick exposure.   Paresthesia Paresthesia is an abnormal burning or prickling sensation. This sensation is generally felt in the hands, arms, legs, or feet. However, it may occur in any part of the body. It is usually not painful. The feeling may be described as:  Tingling or numbness.  "Pins and needles."  Skin crawling.  Buzzing.  Limbs "falling asleep."  Itching. Most people experience temporary (transient) paresthesia at some time in their lives. CAUSES  Paresthesia may occur when you breathe too quickly (hyperventilation). It can also occur without any apparent cause. Commonly, paresthesia occurs when pressure is placed on a nerve. The feeling quickly goes away once the pressure is removed. For some people, however, paresthesia is a long-lasting (chronic) condition caused by an underlying disorder. The underlying disorder may be:  A traumatic, direct injury to nerves. Examples include a:  Broken (fractured) neck.  Fractured skull.  A disorder affecting the brain and spinal cord (central nervous system). Examples include:  Transverse myelitis.  Encephalitis.  Transient ischemic attack.  Multiple sclerosis.  Stroke.  Tumor or blood vessel problems, such as an arteriovenous malformation pressing against the brain or spinal cord.  A condition that damages the peripheral nerves (peripheral neuropathy). Peripheral nerves are not part of the brain and spinal cord. These conditions include:  Diabetes.  Peripheral vascular disease.  Nerve entrapment  syndromes, such as carpal tunnel syndrome.  Shingles.  Hypothyroidism.  Vitamin B12 deficiencies.  Alcoholism.  Heavy metal poisoning (lead, arsenic).  Rheumatoid arthritis.  Systemic lupus erythematosus. DIAGNOSIS  Your caregiver will attempt to find the underlying cause of your paresthesia. Your caregiver may:  Take your medical history.  Perform a physical exam.  Order various lab tests.  Order imaging tests. TREATMENT  Treatment for paresthesia depends on the underlying cause. HOME CARE INSTRUCTIONS  Avoid drinking alcohol.  You may consider massage or acupuncture to help relieve your symptoms.  Keep all follow-up appointments as directed by your caregiver. SEEK IMMEDIATE MEDICAL CARE IF:   You feel weak.  You have trouble walking or moving.  You have problems with speech or vision.  You feel confused.  You cannot control your bladder or bowel movements.  You feel numbness after an injury.  You faint.  Your burning or prickling feeling gets worse when walking.  You have pain, cramps, or dizziness.  You develop a rash. MAKE SURE YOU:  Understand these instructions.  Will watch your condition.  Will get help right away if you are not doing well or get worse. Document Released: 02/15/2002 Document Revised: 05/20/2011 Document Reviewed: 11/16/2010 [facility] Patient Information 2015 [place], [place]. This information is not intended to replace advice given to you by your health care provider. Make sure you discuss any questions you have with your health care provider.

## 2013-10-03 NOTE — ED Notes (Signed)
He had been taken to MR, but was returned here because "I need something to help calm me.".  Order rec'd. From Dr. Eulis Foster.

## 2013-10-03 NOTE — ED Notes (Signed)
Pt given happy meal per Dr Kathrynn Humble.

## 2013-10-03 NOTE — ED Notes (Signed)
He returns from MR at this time.  He is awake and in no distress.

## 2013-12-21 ENCOUNTER — Emergency Department (HOSPITAL_COMMUNITY): Payer: Medicare HMO

## 2013-12-21 ENCOUNTER — Encounter (HOSPITAL_COMMUNITY): Payer: Self-pay | Admitting: Emergency Medicine

## 2013-12-21 ENCOUNTER — Emergency Department (HOSPITAL_COMMUNITY)
Admission: EM | Admit: 2013-12-21 | Discharge: 2013-12-21 | Disposition: A | Discharge status: Home / Self Care | Payer: Medicare HMO | Attending: Emergency Medicine | Admitting: Emergency Medicine

## 2013-12-21 DIAGNOSIS — Z8639 Personal history of other endocrine, nutritional and metabolic disease: Secondary | ICD-10-CM | POA: Insufficient documentation

## 2013-12-21 DIAGNOSIS — Z79899 Other long term (current) drug therapy: Secondary | ICD-10-CM | POA: Diagnosis not present

## 2013-12-21 DIAGNOSIS — Y9289 Other specified places as the place of occurrence of the external cause: Secondary | ICD-10-CM | POA: Diagnosis not present

## 2013-12-21 DIAGNOSIS — Y9301 Activity, walking, marching and hiking: Secondary | ICD-10-CM | POA: Insufficient documentation

## 2013-12-21 DIAGNOSIS — S99912A Unspecified injury of left ankle, initial encounter: Secondary | ICD-10-CM | POA: Diagnosis present

## 2013-12-21 DIAGNOSIS — S93402A Sprain of unspecified ligament of left ankle, initial encounter: Secondary | ICD-10-CM | POA: Insufficient documentation

## 2013-12-21 DIAGNOSIS — X58XXXA Exposure to other specified factors, initial encounter: Secondary | ICD-10-CM | POA: Insufficient documentation

## 2013-12-21 DIAGNOSIS — Z720 Tobacco use: Secondary | ICD-10-CM | POA: Insufficient documentation

## 2013-12-21 DIAGNOSIS — Z8719 Personal history of other diseases of the digestive system: Secondary | ICD-10-CM | POA: Diagnosis not present

## 2013-12-21 DIAGNOSIS — I10 Essential (primary) hypertension: Secondary | ICD-10-CM | POA: Insufficient documentation

## 2013-12-21 DIAGNOSIS — F329 Major depressive disorder, single episode, unspecified: Secondary | ICD-10-CM | POA: Diagnosis not present

## 2013-12-21 MED ORDER — TRAMADOL HCL 50 MG PO TABS: 50.0000 mg | ORAL_TABLET | Freq: Four times a day (QID) | ORAL | Status: DC | PRN

## 2013-12-21 NOTE — ED Notes (Signed)
Patient reports left foot/ankle pain started when he twisted it while walking down steps last night.

## 2013-12-21 NOTE — ED Notes (Signed)
Per EMS last night pain twisted left ankle on the stairs.  Pt has very mild swelling.  No fall just twisted ankle.

## 2013-12-21 NOTE — Discharge Instructions (Signed)

## 2013-12-21 NOTE — ED Notes (Signed)
Bed: WA08 Expected date:  Expected time:  Means of arrival:  Comments: EMS 

## 2013-12-21 NOTE — ED Notes (Signed)
Ortho tech notified.  

## 2013-12-21 NOTE — ED Provider Notes (Addendum)
CSN: [id number]     Arrival date & time 12/21/13  2104 History   First MD Initiated Contact with Patient 12/21/13 2117     Chief Complaint  Patient presents with  . Ankle Pain     (Consider location/radiation/quality/duration/timing/severity/associated sxs/prior Treatment) Patient is a 58 y.o. male presenting with ankle pain. The history is provided by the patient.  Ankle Pain Location:  Ankle Time since incident:  1 day Injury: yes   Mechanism of injury comment:  Stepped down a stair in the dark and rolled his ankle Ankle location:  L ankle Pain details:    Quality:  Aching and throbbing   Radiates to:  Does not radiate   Severity:  Moderate   Onset quality:  Gradual   Duration:  1 day   Timing:  Constant   Progression:  Unchanged Chronicity:  New Prior injury to area:  No Relieved by:  Rest Worsened by:  Activity and bearing weight Ineffective treatments:  None tried Associated symptoms: swelling   Associated symptoms: no decreased ROM     Past Medical History  Diagnosis Date  . Depression   . Hypertension   . Hyperlipidemia   . Chest pain   . Shortness of breath   . GERD (gastroesophageal reflux disease)   . BOFBPZWC(585.2)    Past Surgical History  Procedure Laterality Date  . Wrist fusion Right 04/2012  . Anterior cervical decomp/discectomy fusion N/A 03/24/2013    Procedure: ACDF C5 - C7 2 LEVELS;  Surgeon: Melina Schools, MD;  Location: [facility];  Service: Orthopedics;  Laterality: N/A;   History reviewed. No pertinent family history. History  Substance Use Topics  . Smoking status: Current Every Day Smoker -- 0.50 packs/day for 25 years    Types: Cigarettes  . Smokeless tobacco: Never Used  . Alcohol Use: No    Review of Systems  All other systems reviewed and are negative.     Allergies  Review of patient's allergies indicates no known allergies.  Home Medications   Prior to Admission medications   Medication Sig Start Date End Date Taking?  Authorizing Provider  ALPRAZolam (XANAX) 0.25 MG tablet Take 0.25 mg by mouth 2 (two) times daily as needed for anxiety (anxiety).    Yes Historical Provider, MD  amLODipine (NORVASC) 5 MG tablet Take 5 mg by mouth daily.   Yes Historical Provider, MD  nitroGLYCERIN (NITROSTAT) 0.4 MG SL tablet Place 0.4 mg under the tongue every 5 (five) minutes as needed for chest pain (chest pain).   Yes Historical Provider, MD  QUEtiapine (SEROQUEL) 400 MG tablet Take 400 mg by mouth at bedtime as needed (insomnia).    Yes Historical Provider, MD   BP 141/87  Pulse 67  Temp(Src) 98.2 F (36.8 C) (Oral)  SpO2 99% Physical Exam  Nursing note and vitals reviewed. Constitutional: He is oriented to person, place, and time. He appears well-developed and well-nourished. No distress.  HENT:  Head: Normocephalic and atraumatic.  Eyes: EOM are normal. Pupils are equal, round, and reactive to light.  Cardiovascular: Normal rate.   Pulmonary/Chest: Effort normal.  Musculoskeletal:       Left ankle: He exhibits swelling. He exhibits normal range of motion. Tenderness. Medial malleolus tenderness found. No lateral malleolus, no posterior TFL, no head of 5th metatarsal and no proximal fibula tenderness found. Achilles tendon normal.       Feet:  Neurological: He is alert and oriented to person, place, and time.  Skin: Skin is warm  and dry.  Psychiatric: He has a normal mood and affect. His behavior is normal.    ED Course  Procedures (including critical care time) Labs Review Labs Reviewed - No data to display  Imaging Review Dg Ankle Complete Left  12/21/2013   CLINICAL DATA:  Medial LEFT ankle pain. Initial encounter. Injury last night. Twisted ankle.  EXAM: LEFT ANKLE COMPLETE - 3+ VIEW  COMPARISON:  None.  FINDINGS: There is no evidence of fracture, dislocation, or joint effusion. There is no evidence of arthropathy or other focal bone abnormality. Soft tissues are unremarkable.  IMPRESSION: Negative.    Electronically Signed   By: Dereck Ligas M.D.   On: 12/21/2013 22:05     EKG Interpretation None      MDM   Final diagnoses:  Ankle sprain, left, initial encounter    Patient with an uncomplicated ankle sprain. Imaging is negative. Patient placed in air splint and crutches    Blanchie Dessert, MD 12/21/13 [place], MD 12/21/13 2239

## 2014-02-27 ENCOUNTER — Emergency Department (HOSPITAL_COMMUNITY)
Admission: EM | Admit: 2014-02-27 | Discharge: 2014-02-27 | Disposition: A | Discharge status: Home / Self Care | Payer: Medicare HMO | Attending: Emergency Medicine | Admitting: Emergency Medicine

## 2014-02-27 ENCOUNTER — Encounter (HOSPITAL_COMMUNITY): Payer: Self-pay | Admitting: Adult Health

## 2014-02-27 ENCOUNTER — Emergency Department (HOSPITAL_COMMUNITY): Payer: Medicare HMO

## 2014-02-27 DIAGNOSIS — I10 Essential (primary) hypertension: Secondary | ICD-10-CM | POA: Insufficient documentation

## 2014-02-27 DIAGNOSIS — Z8639 Personal history of other endocrine, nutritional and metabolic disease: Secondary | ICD-10-CM | POA: Diagnosis not present

## 2014-02-27 DIAGNOSIS — R0602 Shortness of breath: Secondary | ICD-10-CM | POA: Diagnosis not present

## 2014-02-27 DIAGNOSIS — Z8719 Personal history of other diseases of the digestive system: Secondary | ICD-10-CM | POA: Diagnosis not present

## 2014-02-27 DIAGNOSIS — F329 Major depressive disorder, single episode, unspecified: Secondary | ICD-10-CM | POA: Insufficient documentation

## 2014-02-27 DIAGNOSIS — Z79899 Other long term (current) drug therapy: Secondary | ICD-10-CM | POA: Diagnosis not present

## 2014-02-27 DIAGNOSIS — Z720 Tobacco use: Secondary | ICD-10-CM | POA: Insufficient documentation

## 2014-02-27 DIAGNOSIS — R079 Chest pain, unspecified: Secondary | ICD-10-CM

## 2014-02-27 DIAGNOSIS — R072 Precordial pain: Secondary | ICD-10-CM | POA: Insufficient documentation

## 2014-02-27 LAB — BASIC METABOLIC PANEL
ANION GAP: 10 (ref 5–15)
BUN: 10 mg/dL (ref 6–23)
CO2: 26 mEq/L (ref 19–32)
Calcium: 9.6 mg/dL (ref 8.4–10.5)
Chloride: 105 mEq/L (ref 96–112)
Creatinine, Ser: 0.95 mg/dL (ref 0.50–1.35)
GFR calc Af Amer: >90 mL/min (ref >90)
Glucose, Bld: 88 mg/dL (ref 70–99)
Potassium: 3.8 mEq/L (ref 3.7–5.3)
SODIUM: 141 meq/L (ref 137–147)

## 2014-02-27 LAB — TROPONIN I: Troponin I: <0.3 ng/mL (ref <0.30)

## 2014-02-27 LAB — I-STAT TROPONIN, ED: TROPONIN I, POC: 0 ng/mL (ref 0.00–0.08)

## 2014-02-27 LAB — CBC
HCT: 39.7 % (ref 39.0–52.0)
HEMOGLOBIN: 13.5 g/dL (ref 13.0–17.0)
MCH: 28.4 pg (ref 26.0–34.0)
MCHC: 34 g/dL (ref 30.0–36.0)
MCV: 83.6 fL (ref 78.0–100.0)
PLATELETS: 207 10*3/uL (ref 150–400)
RBC: 4.75 MIL/uL (ref 4.22–5.81)
RDW: 14.5 % (ref 11.5–15.5)
WBC: 8.4 10*3/uL (ref 4.0–10.5)

## 2014-02-27 MED ORDER — MORPHINE SULFATE 4 MG/ML IJ SOLN
4.0000 mg | Freq: Once | INTRAMUSCULAR | Status: AC
Start: 2014-02-27 — End: 2014-02-27
  Administered 2014-02-27: 4 mg via INTRAVENOUS
  Filled 2014-02-27: qty 1

## 2014-02-27 NOTE — ED Notes (Signed)
MEAL PROVIDED AT PATIENT REQUEST WITH OKAY FROM PHYSICIAN.  NO DISTRESS NOTED OR COMPLAINTS VOICED.

## 2014-02-27 NOTE — ED Notes (Addendum)
Presents with SOB and cp that began last night, today woke with intermittent nausea and left shoulder pain. Has not taken BP medication today or yesterday due to the pain, given 4 nitro by EMS, 324 ASA, and pain has decreased mildly but still at 7/10. Cp occurs at rest with no increase during palpation or activity.  During transport began having sharp lower left chest pain described as stabbing. Endorses dizziness and "feelingI have been drinking, but I have not"

## 2014-02-27 NOTE — ED Notes (Signed)
Able to finish entire sandwich.  States I'm surprised I kept it down but my nausea is gone.  No distress noted at this time.

## 2014-02-27 NOTE — Discharge Instructions (Signed)

## 2014-02-27 NOTE — ED Provider Notes (Signed)
CSN: [id number]     Arrival date & time 02/27/14  1714 History   First MD Initiated Contact with Patient 02/27/14 1746     Chief Complaint  Patient presents with  . Chest Pain     (Consider location/radiation/quality/duration/timing/severity/associated sxs/prior Treatment) Patient is a 58 y.o. male presenting with chest pain. The history is provided by the patient.  Chest Pain Pain location:  Substernal area Pain quality: pressure   Pain radiates to:  L shoulder Pain radiates to the back: no   Pain severity:  Severe Onset quality:  Gradual Duration:  1 day Timing:  Constant Progression:  Waxing and waning Chronicity:  New Context: at rest   Relieved by:  Nitroglycerin (reduced from 10/10 to 7/10) Worsened by:  Nothing tried Associated symptoms: shortness of breath   Associated symptoms: no abdominal pain, no back pain, no diaphoresis, no dizziness, no dysphagia, no fever, no headache, no nausea, no palpitations, not vomiting and no weakness   Risk factors: no prior DVT/PE     Past Medical History  Diagnosis Date  . Depression   . Hypertension   . Hyperlipidemia   . Chest pain   . Shortness of breath   . GERD (gastroesophageal reflux disease)   . FYBOFBPZ(025.8)    Past Surgical History  Procedure Laterality Date  . Wrist fusion Right 04/2012  . Anterior cervical decomp/discectomy fusion N/A 03/24/2013    Procedure: ACDF C5 - C7 2 LEVELS;  Surgeon: Melina Schools, MD;  Location: [place];  Service: Orthopedics;  Laterality: N/A;   History reviewed. No pertinent family history. History  Substance Use Topics  . Smoking status: Current Every Day Smoker -- 0.50 packs/day for 25 years    Types: Cigarettes  . Smokeless tobacco: Never Used  . Alcohol Use: No    Review of Systems  Constitutional: Negative for fever, diaphoresis, activity change and appetite change.  HENT: Negative for facial swelling, sore throat, tinnitus, trouble swallowing and voice change.   Eyes:  Negative for pain, redness and visual disturbance.  Respiratory: Positive for shortness of breath. Negative for chest tightness and wheezing.   Cardiovascular: Positive for chest pain. Negative for palpitations and leg swelling.  Gastrointestinal: Negative for nausea, vomiting, abdominal pain, diarrhea, constipation and abdominal distention.  Endocrine: Negative.   Genitourinary: Negative.  Negative for dysuria, decreased urine volume, scrotal swelling and testicular pain.  Musculoskeletal: Negative for myalgias, back pain and gait problem.  Skin: Negative.  Negative for rash.  Neurological: Negative.  Negative for dizziness, tremors, weakness and headaches.  Psychiatric/Behavioral: Negative for suicidal ideas, hallucinations and self-injury. The patient is not nervous/anxious.       Allergies  Review of patient's allergies indicates no known allergies.  Home Medications   Prior to Admission medications   Medication Sig Start Date End Date Taking? Authorizing Provider  ALPRAZolam (XANAX) 0.25 MG tablet Take 0.25 mg by mouth 2 (two) times daily as needed for anxiety.    Yes Historical Provider, MD  amLODipine (NORVASC) 5 MG tablet Take 5 mg by mouth daily.   Yes Historical Provider, MD  buPROPion (WELLBUTRIN) 75 MG tablet Take 75 mg by mouth at bedtime.  12/16/13  Yes Historical Provider, MD  carvedilol (COREG) 3.125 MG tablet Take 3.125 mg by mouth 2 (two) times daily with a meal.  01/19/14  Yes Historical Provider, MD  chlorthalidone (HYGROTON) 25 MG tablet Take 25 mg by mouth daily.  01/19/14  Yes Historical Provider, MD  HYDROcodone-acetaminophen ([place]) 10-325 MG per  tablet Take 1 tablet by mouth every 6 (six) hours as needed (pain).  01/31/14  Yes Historical Provider, MD  lisinopril (PRINIVIL,ZESTRIL) 5 MG tablet Take 5 mg by mouth daily.  01/19/14  Yes Historical Provider, MD  nitroGLYCERIN (NITROSTAT) 0.4 MG SL tablet Place 0.4 mg under the tongue every 5 (five) minutes as needed  for chest pain.    Yes Historical Provider, MD  QUEtiapine (SEROQUEL) 400 MG tablet Take 400 mg by mouth at bedtime.    Yes Historical Provider, MD  traMADol (ULTRAM) 50 MG tablet Take 1 tablet (50 mg total) by mouth every 6 (six) hours as needed. Patient not taking: Reported on 02/27/2014 12/21/13   Blanchie Dessert, MD   BP 126/81 mmHg  Pulse 58  Temp(Src) 98 F (36.7 C) (Oral)  Resp 14  SpO2 98% Physical Exam  Constitutional: He is oriented to person, place, and time. He appears well-developed and well-nourished. No distress.  HENT:  Head: Normocephalic and atraumatic.  Right Ear: External ear normal.  Left Ear: External ear normal.  Nose: Nose normal.  Mouth/Throat: Oropharynx is clear and moist.  Eyes: Conjunctivae and EOM are normal. Pupils are equal, round, and reactive to light. No scleral icterus.  Neck: Normal range of motion. Neck supple. No JVD present. No tracheal deviation present. No thyromegaly present.  Cardiovascular: Normal rate and intact distal pulses.  Exam reveals no gallop and no friction rub.   No murmur heard. Pulmonary/Chest: Effort normal and breath sounds normal. No stridor. No respiratory distress. He has no wheezes. He has no rales.  Abdominal: Soft. He exhibits no distension. There is no tenderness. There is no rebound and no guarding.  Musculoskeletal: Normal range of motion. He exhibits no edema or tenderness.  Neurological: He is alert and oriented to person, place, and time. No cranial nerve deficit. He exhibits normal muscle tone. Coordination normal.  5/5 strength in all 4 extremities. Normal Gait.   Skin: Skin is warm and dry. No rash noted. He is not diaphoretic.  Psychiatric: He has a normal mood and affect. His behavior is normal.  Nursing note and vitals reviewed.   ED Course  Procedures (including critical care time) Labs Review Labs Reviewed  BASIC METABOLIC PANEL  CBC  TROPONIN I  TROPONIN I  Randolm Idol, ED    Imaging  Review Dg Chest Port 1 View  02/27/2014   CLINICAL DATA:  Chest pain, shortness of breath  EXAM: PORTABLE CHEST - 1 VIEW  COMPARISON:  08/26/2013  FINDINGS: Lungs are clear.  No pleural effusion or pneumothorax.  The heart is normal in size.  IMPRESSION: No evidence of acute cardiopulmonary disease.   Electronically Signed   By: Julian Hy M.D.   On: 02/27/2014 17:42     EKG Interpretation   Date/Time:  Sunday February 27 2014 17:21:03 EST Ventricular Rate:  64 PR Interval:  166 QRS Duration: 97 QT Interval:  396 QTC Calculation: 408 R Axis:   67 Text Interpretation:  Sinus rhythm Atrial premature complex Consider left  atrial enlargement RSR' in V1 or V2, probably normal variant Borderline ST  elevation, anterior leads which is seen on prior EKG 7/18/'15  No  significant change since last tracing Confirmed by [place]  [phone]) on 02/27/2014 5:32:28 PM      MDM   Final diagnoses:  Chest pain at rest    Patient presents with 1 day of substernal non-exertional chest pan. Patient AFVSS. EKG unchanged from prior studies. Feel  pain is atypical. Do not clinically suspect PE and WELLS 0, will not order further testing for PE. Plan is to delta troponin patient and reevaluate.   Deltra troponin negative. Patient reassessed and chest pain much improved. I estimate there is LOW risk for PERICARDIAL TAMPONADE, PNEUMOTHORAX, PULMONARY EMBOLISM, ACUTE CORONARY SYNDROME, OR THORACIC AORTIC DISSECTION, thus I consider the discharge disposition reasonable. We have discussed the diagnosis and risks, and we agree with discharging home to follow-up with their primary doctor. We also discussed returning to the Emergency Department immediately if new or worsening symptoms occur. We have discussed the symptoms which are most concerning (e.g., bloody sputum, fever, worsening pain or shortness of breath, vomiting) that necessitate immediate return.  Patient seen with attending, Dr.  Tawnya Crook, who oversaw clinical decision making.   Margaretann Loveless, MD 02/28/14 7614  Ernestina Patches, MD 02/28/14 [phone]

## 2014-04-10 ENCOUNTER — Encounter (HOSPITAL_COMMUNITY): Payer: Self-pay | Admitting: Emergency Medicine

## 2014-04-10 ENCOUNTER — Emergency Department (HOSPITAL_COMMUNITY)
Admission: EM | Admit: 2014-04-10 | Discharge: 2014-04-12 | Disposition: A | Discharge status: Home / Self Care | Payer: Medicare HMO | Attending: Emergency Medicine | Admitting: Emergency Medicine

## 2014-04-10 DIAGNOSIS — R45851 Suicidal ideations: Secondary | ICD-10-CM

## 2014-04-10 DIAGNOSIS — F329 Major depressive disorder, single episode, unspecified: Secondary | ICD-10-CM

## 2014-04-10 DIAGNOSIS — F32A Depression, unspecified: Secondary | ICD-10-CM

## 2014-04-10 DIAGNOSIS — F322 Major depressive disorder, single episode, severe without psychotic features: Secondary | ICD-10-CM | POA: Diagnosis not present

## 2014-04-10 DIAGNOSIS — F1210 Cannabis abuse, uncomplicated: Secondary | ICD-10-CM | POA: Insufficient documentation

## 2014-04-10 DIAGNOSIS — Z720 Tobacco use: Secondary | ICD-10-CM | POA: Insufficient documentation

## 2014-04-10 DIAGNOSIS — I10 Essential (primary) hypertension: Secondary | ICD-10-CM | POA: Diagnosis not present

## 2014-04-10 DIAGNOSIS — F332 Major depressive disorder, recurrent severe without psychotic features: Secondary | ICD-10-CM | POA: Diagnosis present

## 2014-04-10 DIAGNOSIS — Z8639 Personal history of other endocrine, nutritional and metabolic disease: Secondary | ICD-10-CM | POA: Insufficient documentation

## 2014-04-10 DIAGNOSIS — Z8719 Personal history of other diseases of the digestive system: Secondary | ICD-10-CM | POA: Diagnosis not present

## 2014-04-10 DIAGNOSIS — R45850 Homicidal ideations: Secondary | ICD-10-CM

## 2014-04-10 DIAGNOSIS — Z79899 Other long term (current) drug therapy: Secondary | ICD-10-CM | POA: Diagnosis not present

## 2014-04-10 LAB — RAPID URINE DRUG SCREEN, HOSP PERFORMED
AMPHETAMINES: NOT DETECTED
Barbiturates: NOT DETECTED
Benzodiazepines: NOT DETECTED
Cocaine: NOT DETECTED
OPIATES: NOT DETECTED
Tetrahydrocannabinol: POSITIVE — AB

## 2014-04-10 LAB — CBC
HCT: 43.8 % (ref 39.0–52.0)
HEMOGLOBIN: 14.5 g/dL (ref 13.0–17.0)
MCH: 28.5 pg (ref 26.0–34.0)
MCHC: 33.1 g/dL (ref 30.0–36.0)
MCV: 86.2 fL (ref 78.0–100.0)
Platelets: 228 10*3/uL (ref 150–400)
RBC: 5.08 MIL/uL (ref 4.22–5.81)
RDW: 14.9 % (ref 11.5–15.5)
WBC: 9.3 10*3/uL (ref 4.0–10.5)

## 2014-04-10 LAB — COMPREHENSIVE METABOLIC PANEL
ALT: 26 U/L (ref 0–53)
AST: 51 U/L — ABNORMAL HIGH (ref 0–37)
Albumin: 4.3 g/dL (ref 3.5–5.2)
Alkaline Phosphatase: 67 U/L (ref 39–117)
Anion gap: 6 (ref 5–15)
BUN: 14 mg/dL (ref 6–23)
CO2: 27 mmol/L (ref 19–32)
Calcium: 9.1 mg/dL (ref 8.4–10.5)
Chloride: 105 mmol/L (ref 96–112)
Creatinine, Ser: 1.02 mg/dL (ref 0.50–1.35)
GFR calc Af Amer: >90 mL/min (ref >90)
GFR calc non Af Amer: 80 mL/min — ABNORMAL LOW (ref >90)
Glucose, Bld: 87 mg/dL (ref 70–99)
Potassium: 4 mmol/L (ref 3.5–5.1)
Sodium: 138 mmol/L (ref 135–145)
Total Bilirubin: 0.6 mg/dL (ref 0.3–1.2)
Total Protein: 7.3 g/dL (ref 6.0–8.3)

## 2014-04-10 LAB — ETHANOL

## 2014-04-10 LAB — ACETAMINOPHEN LEVEL: Acetaminophen (Tylenol), Serum: <10 ug/mL — ABNORMAL LOW (ref 10–30)

## 2014-04-10 LAB — SALICYLATE LEVEL

## 2014-04-10 MED ORDER — LORAZEPAM 1 MG PO TABS
1.0000 mg | ORAL_TABLET | Freq: Three times a day (TID) | ORAL | Status: DC | PRN
  Administered 2014-04-10 – 2014-04-11 (×2): 1 mg via ORAL
  Filled 2014-04-10 (×2): qty 1

## 2014-04-10 MED ORDER — ZOLPIDEM TARTRATE 5 MG PO TABS
5.0000 mg | ORAL_TABLET | Freq: Every evening | ORAL | Status: DC | PRN
  Administered 2014-04-10: 5 mg via ORAL
  Filled 2014-04-10: qty 1

## 2014-04-10 MED ORDER — ONDANSETRON HCL 4 MG PO TABS
4.0000 mg | ORAL_TABLET | Freq: Three times a day (TID) | ORAL | Status: DC | PRN
Start: 2014-04-10 — End: 2014-04-12

## 2014-04-10 MED ORDER — NICOTINE 21 MG/24HR TD PT24
21.0000 mg | MEDICATED_PATCH | Freq: Every day | TRANSDERMAL | Status: DC
  Administered 2014-04-10 – 2014-04-12 (×3): 21 mg via TRANSDERMAL
  Filled 2014-04-10 (×2): qty 1

## 2014-04-10 MED ORDER — ACETAMINOPHEN 325 MG PO TABS
650.0000 mg | ORAL_TABLET | ORAL | Status: DC | PRN
  Filled 2014-04-10: qty 2

## 2014-04-10 NOTE — BH Assessment (Signed)
Assessment completed. Consulted Glenda Chroman, NP who recommended inpatient treatment. TTS will contact other facilities for placement. Antonietta Breach, PA-C has been informed of the recommendation.

## 2014-04-10 NOTE — ED Notes (Signed)
Pt c/o depression, states meds are not working. Pt c/o also of deep thoughts of SI/HI.

## 2014-04-10 NOTE — ED Provider Notes (Signed)
CSN: [id number]     Arrival date & time 04/10/14  1828 History   First MD Initiated Contact with Patient 04/10/14 1900     Chief Complaint  Patient presents with  . Depression     (Consider location/radiation/quality/duration/timing/severity/associated sxs/prior Treatment) HPI Comments: Patient is 59 year old male with a history of depression, hypertension, hyperlipidemia, and esophageal reflux. He presents to the emergency department today for worsening depression. Patient states that his depression has been worsening over the past 4 days. He has been compliant with his Wellbutrin, but none of his other medications. Patient states that he has had worsening suicidal thoughts with a plan to walk into traffic. He denies any specific homicidal ideations, but states that he wants other people to leave him alone. He denies any alcohol use. He does state that he uses marijuana on occasion to help with his depression. He has no other complaints for this visit.  The history is provided by the patient. No language interpreter was used.    Past Medical History  Diagnosis Date  . Depression   . Hypertension   . Hyperlipidemia   . Chest pain   . Shortness of breath   . GERD (gastroesophageal reflux disease)   . LGXQJJHE(174.0)    Past Surgical History  Procedure Laterality Date  . Wrist fusion Right 04/2012  . Anterior cervical decomp/discectomy fusion N/A 03/24/2013    Procedure: ACDF C5 - C7 2 LEVELS;  Surgeon: Melina Schools, MD;  Location: [facility];  Service: Orthopedics;  Laterality: N/A;   No family history on file. History  Substance Use Topics  . Smoking status: Current Every Day Smoker -- 0.50 packs/day for 25 years    Types: Cigarettes  . Smokeless tobacco: Never Used  . Alcohol Use: No    Review of Systems  Psychiatric/Behavioral: Positive for suicidal ideas and behavioral problems.  All other systems reviewed and are negative.   Allergies  Review of patient's allergies  indicates no known allergies.  Home Medications   Prior to Admission medications   Medication Sig Start Date End Date Taking? Authorizing Provider  ALPRAZolam (XANAX) 0.25 MG tablet Take 0.25 mg by mouth 2 (two) times daily as needed for anxiety (anxiety).    Yes Historical Provider, MD  amLODipine (NORVASC) 5 MG tablet Take 5 mg by mouth daily.   Yes Historical Provider, MD  buPROPion (WELLBUTRIN) 75 MG tablet Take 75 mg by mouth at bedtime.  12/16/13  Yes Historical Provider, MD  carvedilol (COREG) 3.125 MG tablet Take 3.125 mg by mouth 2 (two) times daily with a meal.  01/19/14  Yes Historical Provider, MD  chlorthalidone (HYGROTON) 25 MG tablet Take 25 mg by mouth daily.  01/19/14  Yes Historical Provider, MD  HYDROcodone-acetaminophen ([place]) 10-325 MG per tablet Take 1 tablet by mouth every 6 (six) hours as needed (pain).  01/31/14  Yes Historical Provider, MD  lisinopril (PRINIVIL,ZESTRIL) 5 MG tablet Take 5 mg by mouth daily.  01/19/14  Yes Historical Provider, MD  nitroGLYCERIN (NITROSTAT) 0.4 MG SL tablet Place 0.4 mg under the tongue every 5 (five) minutes as needed for chest pain (chest pain).    Yes Historical Provider, MD  QUEtiapine (SEROQUEL) 400 MG tablet Take 400 mg by mouth at bedtime.    Yes Historical Provider, MD  risperiDONE (RISPERDAL) 2 MG tablet Take 2 mg by mouth at bedtime.   Yes Historical Provider, MD  traMADol (ULTRAM) 50 MG tablet Take 1 tablet (50 mg total) by mouth every 6 (six)  hours as needed. Patient not taking: Reported on 02/27/2014 12/21/13   Blanchie Dessert, MD   BP 154/95 mmHg  Pulse 74  Temp(Src) 98.4 F (36.9 C) (Oral)  Resp 15  SpO2 98%   Physical Exam  Constitutional: He is oriented to person, place, and time. He appears well-developed and well-nourished. No distress.  Nontoxic/nonseptic appearing  HENT:  Head: Normocephalic and atraumatic.  Eyes: Conjunctivae and EOM are normal. No scleral icterus.  Neck: Normal range of motion.   Pulmonary/Chest: Effort normal. No respiratory distress.  Respirations even and unlabored  Musculoskeletal: Normal range of motion.  Neurological: He is alert and oriented to person, place, and time. He exhibits normal muscle tone. Coordination normal.  GCS 15. Speech is goal oriented. Patient moving extremities without ataxia.  Skin: Skin is warm and dry. No rash noted. He is not diaphoretic. No erythema. No pallor.  Psychiatric: His speech is normal. He is withdrawn. He exhibits a depressed mood. He expresses suicidal ideation. He expresses no homicidal ideation. He expresses suicidal plans. He expresses no homicidal plans.  Nursing note and vitals reviewed.   ED Course  Procedures (including critical care time) Labs Review Labs Reviewed  ACETAMINOPHEN LEVEL - Abnormal; Notable for the following:    Acetaminophen (Tylenol), Serum <10.0 (*)    All other components within normal limits  COMPREHENSIVE METABOLIC PANEL - Abnormal; Notable for the following:    AST 51 (*)    GFR calc non Af Amer 80 (*)    All other components within normal limits  URINE RAPID DRUG SCREEN (HOSP PERFORMED) - Abnormal; Notable for the following:    Tetrahydrocannabinol POSITIVE (*)    All other components within normal limits  CBC  ETHANOL  SALICYLATE LEVEL    Imaging Review No results found.   EKG Interpretation None      MDM   Final diagnoses:  Depression with suicidal ideation    59 year old male presents to the emergency department for worsening depression with suicidal thoughts. He has been medically cleared and evaluated by TTS. Patient meets inpatient criteria. No beds available at [facility]. TTS seeking placement.   Filed Vitals:   04/10/14 1848  BP: 154/95  Pulse: 74  Temp: 98.4 F (36.9 C)  TempSrc: Oral  Resp: 15  SpO2: 98%     Antonietta Breach, PA-C 04/10/14 2356  Leota Jacobsen, MD 04/13/14 [phone]

## 2014-04-10 NOTE — ED Notes (Addendum)
Patient endorses SI/HI Stated "I have been depressed all my life; feel like ending it all". Patient stated that he has attempted suicide few months ago but did not want to go into details. At these point, patient became agitated and asked this writer to leave him alone. Patient covered his face with blanket.  Patient was offered and accepted Ativan 1mg  PRN for anxiety and Ambien 5 mg for sleep. Patient verbally contracted for safety. Safety maintained at all times. Will continue to monitor patient for safety and stability.
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
## 2014-04-11 DIAGNOSIS — F332 Major depressive disorder, recurrent severe without psychotic features: Secondary | ICD-10-CM

## 2014-04-11 DIAGNOSIS — R45851 Suicidal ideations: Secondary | ICD-10-CM | POA: Diagnosis not present

## 2014-04-11 DIAGNOSIS — F322 Major depressive disorder, single episode, severe without psychotic features: Secondary | ICD-10-CM | POA: Diagnosis not present

## 2014-04-11 MED ORDER — HYDROXYZINE HCL 25 MG PO TABS
25.0000 mg | ORAL_TABLET | Freq: Three times a day (TID) | ORAL | Status: DC | PRN
  Administered 2014-04-11: 25 mg via ORAL
  Filled 2014-04-11: qty 1

## 2014-04-11 MED ORDER — ZIPRASIDONE MESYLATE 20 MG IM SOLR
20.0000 mg | Freq: Once | INTRAMUSCULAR | Status: DC
Start: 2014-04-11 — End: 2014-04-12
  Filled 2014-04-11: qty 20

## 2014-04-11 MED ORDER — CARVEDILOL 3.125 MG PO TABS
3.1250 mg | ORAL_TABLET | Freq: Two times a day (BID) | ORAL | Status: DC
  Administered 2014-04-11 – 2014-04-12 (×2): 3.125 mg via ORAL
  Filled 2014-04-11 (×4): qty 1

## 2014-04-11 MED ORDER — LORAZEPAM 1 MG PO TABS: 1.0000 mg | ORAL_TABLET | Freq: Once | ORAL | Status: DC

## 2014-04-11 MED ORDER — LISINOPRIL 5 MG PO TABS: 5.0000 mg | ORAL_TABLET | Freq: Every day | ORAL | Status: DC

## 2014-04-11 MED ORDER — FLUOXETINE HCL 10 MG PO CAPS
10.0000 mg | ORAL_CAPSULE | Freq: Every day | ORAL | Status: DC
  Administered 2014-04-11 – 2014-04-12 (×2): 10 mg via ORAL
  Filled 2014-04-11 (×2): qty 1

## 2014-04-11 MED ORDER — LORAZEPAM 2 MG/ML IJ SOLN: 1.0000 mg | Freq: Once | INTRAMUSCULAR | Status: DC

## 2014-04-11 MED ORDER — QUETIAPINE FUMARATE 300 MG PO TABS
400.0000 mg | ORAL_TABLET | Freq: Every day | ORAL | Status: DC
  Administered 2014-04-11: 400 mg via ORAL
  Filled 2014-04-11 (×2): qty 1

## 2014-04-11 MED ORDER — DIPHENHYDRAMINE HCL 50 MG/ML IJ SOLN
25.0000 mg | Freq: Once | INTRAMUSCULAR | Status: DC
  Filled 2014-04-11: qty 1

## 2014-04-11 MED ORDER — LORAZEPAM 2 MG/ML IJ SOLN
2.0000 mg | Freq: Once | INTRAMUSCULAR | Status: DC
  Filled 2014-04-11: qty 1

## 2014-04-11 MED ORDER — BUPROPION HCL 75 MG PO TABS: 75.0000 mg | ORAL_TABLET | Freq: Every day | ORAL | Status: DC

## 2014-04-11 MED ORDER — CHLORTHALIDONE 25 MG PO TABS
25.0000 mg | ORAL_TABLET | Freq: Every day | ORAL | Status: DC
  Administered 2014-04-11 – 2014-04-12 (×2): 25 mg via ORAL
  Filled 2014-04-11 (×2): qty 1

## 2014-04-11 MED ORDER — HALOPERIDOL 5 MG PO TABS: 5.0000 mg | ORAL_TABLET | Freq: Once | ORAL | Status: DC

## 2014-04-11 MED ORDER — HALOPERIDOL LACTATE 5 MG/ML IJ SOLN
5.0000 mg | Freq: Once | INTRAMUSCULAR | Status: DC
Start: 2014-04-11 — End: 2014-04-11

## 2014-04-11 MED ORDER — NITROGLYCERIN 0.4 MG SL SUBL: 0.4000 mg | SUBLINGUAL_TABLET | SUBLINGUAL | Status: DC | PRN

## 2014-04-11 NOTE — BH Assessment (Signed)
[facility] Assessment Progress Note  Per Bedelia Person, [place], Claudette Head at [facility] called, reporting that pt has been accepted to their facility.  Pt is currently under voluntary status.  I spoke to the pt and he is agreeable to this plan.  Pt will be transported by [facility].  While writing this note Claudette Head called back.  Pt's room assignment is 312.  Please call report to [phone].  Please note that pt is not to be transferred until after 19:00.  Pt's nurse, Dawnely, has been notified of current status.  Jalene Mullet, MA Triage Specialist 04/11/2014 @ 15:45

## 2014-04-11 NOTE — ED Notes (Signed)
Patient has been sad, but cooperative all day.  After he was told he was going to [facility] he became very tearful and stated he would rather go anywhere than there.  Discussed with Reginold Agent, NP who stated that patient would have to be placed under IVC if he would not go on his own.  He then stated that we would have to commit him because if he had to go to [facility] he would leave here kicking and screaming.  Spoke with Cyril Mourning who relayed message to [place], [facility] at [facility].  She is willing to look at patients chart and look at bed availability.  She will call back with update.  Patient has been kept informed of all that is going on.

## 2014-04-11 NOTE — ED Notes (Signed)
Patient has been in his room most of the day.  Has been quiet and cooperative.  Appetite is good and he is tolerating medications.  Will be transferred later today to [facility].  Patient is aware.

## 2014-04-11 NOTE — BHH Counselor (Signed)
Paul Brady, [facility], confirms adult unit is currently at capacity. Contacted the following facilities for placement:  BED AVAILABLE, FAXED CLINICAL INFORMATION: [place]: [place], per [facility], per [facility], per [facility], per [facility], per [facility], per [facility], per [facility], per [facility], per [facility], per [facility], per [facility], per [facility], per [facility], per [facility], per [facility], per [facility], per [facility], per [facility] RESPONSE: [facility] [address] [place], [place], [facility] Triage Specialist [phone]

## 2014-04-11 NOTE — ED Notes (Addendum)
EKG being obtained by Mervyn Skeeters at present.  Pt complained of L arm pain, rating as 8-9 on pain scale. EKG eval by Dr Colin Mulders.

## 2014-04-11 NOTE — ED Notes (Signed)
Pt sleeping at present, feels better, rates pain as 1-2 on pain scale.
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
## 2014-04-11 NOTE — Consult Note (Signed)
[facility] Psychiatry Consult   Reason for Consult:  Depression Referring Physician:  EDP Patient Identification: Paul Brady MRN:  [id number] Principal Diagnosis: Major depressive disorder, recurrent severe without psychotic features Diagnosis:   Patient Active Problem List   Diagnosis Date Noted  . Major depressive disorder, recurrent severe without psychotic features [F33.2] 04/11/2014    Priority: High  . Chest pain at rest [R07.9] 08/26/2013  . Protein-calorie malnutrition, severe [E43] 08/26/2013  . Cervical radicular pain [M54.12] 03/25/2013  . Neck pain [M54.2] 03/24/2013  . Chest pain [R07.9] 08/04/2012  . HTN (hypertension) [I10] 08/04/2012  . Hyperlipidemia [E78.5] 08/04/2012  . Tobacco abuse [Z72.0] 08/04/2012    Total Time spent with patient: 45 minutes  Subjective:   Paul Brady is a 59 y.o. male patient admitted with Recurrent Major depression.  HPI:  AA male, 59 years old was assessed this morning seeking treatment for increased depression.  Patient stated that he has had increase in depressed mood and and has been crying off and on.  Patient intermittently cried during this encounter.  He sees a Teacher, music through his ACT team but ran out of his medications for 3 weeks.  Patient reported previous hospitalization in our [facility] ago.  Patient reports he feels hopeless, helpless.  He reports poor sleep, sleep only 2 hours every night.  He reports poor appetite.  Patient stated that life is not worth living any more. Patient admitted to using Marijuana once or twice a week to help his mood.  He denies HI/AVH.  We have accepted patient for admission and will be transferring him to any facility with available inpatient Psychiatric bed.  We have already resumed his medications.  Patient has been accepted at [facility].  Patient will be transferred to [place]  HPI Elements:   Location:  Major depression, recurrent. Quality:  insomnia, crying  spells, poor appetite,feels hopeless and helpless.. Severity:  severe. Timing:  Acute. Duration:  Chronic mental illness. Context:  Seeking treatment for his depression.  Past Medical History:  Past Medical History  Diagnosis Date  . Depression   . Hypertension   . Hyperlipidemia   . Chest pain   . Shortness of breath   . GERD (gastroesophageal reflux disease)   . FIEPPIRJ(188.4)     Past Surgical History  Procedure Laterality Date  . Wrist fusion Right 04/2012  . Anterior cervical decomp/discectomy fusion N/A 03/24/2013    Procedure: ACDF C5 - C7 2 LEVELS;  Surgeon: Melina Schools, MD;  Location: [facility];  Service: Orthopedics;  Laterality: N/A;   Family History: No family history on file. Social History:  History  Alcohol Use No     History  Drug Use  . Yes    History   Social History  . Marital Status: Legally Separated    Spouse Name: N/A    Number of Children: N/A  . Years of Education: N/A   Social History Main Topics  . Smoking status: Current Every Day Smoker -- 0.50 packs/day for 25 years    Types: Cigarettes  . Smokeless tobacco: Never Used  . Alcohol Use: No  . Drug Use: Yes  . Sexual Activity: None   Other Topics Concern  . None   Social History Narrative   Additional Social History:    History of alcohol / drug use?: Yes Name of Substance 1: THC  1 - Age of First Use: 14 1 - Amount (size/oz): varies  1 - Frequency: daily  1 -  Duration: ongoing  1 - Last Use / Amount: 04-08-14                   Allergies:  No Known Allergies  Vitals: Blood pressure 132/88, pulse 75, temperature 98.8 F (37.1 C), temperature source Oral, resp. rate 18, SpO2 100 %.  Risk to Self: Suicidal Ideation: Yes-Currently Present Suicidal Intent: Yes-Currently Present Is patient at risk for suicide?: Yes Suicidal Plan?: Yes-Currently Present Specify Current Suicidal Plan: "Plenty of ways, I could walk out into traffic" Access to Means: Yes Specify Access to  Suicidal Means: Pt has access to traffic.  What has been your use of drugs/alcohol within the last 12 months?: Pt reported that he smokes THC daily.  How many times?: 0 Other Self Harm Risks: No other self harm risk identified at this time.  Triggers for Past Attempts: None known Intentional Self Injurious Behavior: None Risk to Others: Homicidal Ideation: Yes-Currently Present Thoughts of Harm to Others: Yes-Currently Present Comment - Thoughts of Harm to Others: "Choke the living shit out of them" Current Homicidal Intent: Yes-Currently Present Current Homicidal Plan: Yes-Currently Present Describe Current Homicidal Plan: "Choke the living shit out of them".  Access to Homicidal Means: Yes Describe Access to Homicidal Means: PT could use his hands to choke someone. Identified Victim: Pt refuse to provide a name only stated "they just get on my nerves asking me for everything". History of harm to others?: No Assessment of Violence: On admission Violent Behavior Description: No violent behaviors observed. Pt is calm and cooperative at this time. ' Does patient have access to weapons?: No Criminal Charges Pending?: No Does patient have a court date: No Prior Inpatient Therapy: Prior Inpatient Therapy: Yes Prior Therapy Dates: 2012 Prior Therapy Facilty/Provider(s): [facility] Reason for Treatment: Depression  Prior Outpatient Therapy: Prior Outpatient Therapy: Yes Prior Therapy Dates: 2015 Prior Therapy Facilty/Provider(s): [facility] Reason for Treatment: Medication management  Current Facility-Administered Medications  Medication Dose Route Frequency Provider Last Rate Last Dose  . acetaminophen (TYLENOL) tablet 650 mg  650 mg Oral Q4H PRN Antonietta Breach, PA-C      . carvedilol (COREG) tablet 3.125 mg  3.125 mg Oral BID WC Haley Fuerstenberg      . chlorthalidone (HYGROTON) tablet 25 mg  25 mg Oral Daily Depaul Arizpe   25 mg at 04/11/14 1239  . FLUoxetine (PROZAC) capsule 10 mg   10 mg Oral Daily Vicki Chaffin   10 mg at 04/11/14 1239  . hydrOXYzine (ATARAX/VISTARIL) tablet 25 mg  25 mg Oral TID PRN Neve Branscomb      . LORazepam (ATIVAN) tablet 1 mg  1 mg Oral Q8H PRN Antonietta Breach, PA-C   1 mg at 04/10/14 2052  . nicotine (NICODERM CQ - dosed in mg/24 hours) patch 21 mg  21 mg Transdermal Daily Antonietta Breach, PA-C   21 mg at 04/11/14 0935  . nitroGLYCERIN (NITROSTAT) SL tablet 0.4 mg  0.4 mg Sublingual Q5 min PRN Raymie Trani      . ondansetron (ZOFRAN) tablet 4 mg  4 mg Oral Q8H PRN Antonietta Breach, PA-C      . QUEtiapine (SEROQUEL) tablet 400 mg  400 mg Oral QHS Damilola Flamm       Current Outpatient Prescriptions  Medication Sig Dispense Refill  . ALPRAZolam (XANAX) 0.25 MG tablet Take 0.25 mg by mouth 2 (two) times daily as needed for anxiety (anxiety).     [name] amLODipine (NORVASC) 5 MG tablet Take 5 mg  by mouth daily.    [name] buPROPion (WELLBUTRIN) 75 MG tablet Take 75 mg by mouth at bedtime.   0  . carvedilol (COREG) 3.125 MG tablet Take 3.125 mg by mouth 2 (two) times daily with a meal.   0  . chlorthalidone (HYGROTON) 25 MG tablet Take 25 mg by mouth daily.   0  . HYDROcodone-acetaminophen (NORCO) 10-325 MG per tablet Take 1 tablet by mouth every 6 (six) hours as needed (pain).   0  . lisinopril (PRINIVIL,ZESTRIL) 5 MG tablet Take 5 mg by mouth daily.   0  . nitroGLYCERIN (NITROSTAT) 0.4 MG SL tablet Place 0.4 mg under the tongue every 5 (five) minutes as needed for chest pain (chest pain).     . QUEtiapine (SEROQUEL) 400 MG tablet Take 400 mg by mouth at bedtime.     . risperiDONE (RISPERDAL) 2 MG tablet Take 2 mg by mouth at bedtime.    . traMADol (ULTRAM) 50 MG tablet Take 1 tablet (50 mg total) by mouth every 6 (six) hours as needed. (Patient not taking: Reported on 02/27/2014) 15 tablet 0    Musculoskeletal: Strength & Muscle Tone: within normal limits Gait & Station: normal Patient leans: N/A  Psychiatric Specialty Exam:     Blood pressure 132/88,  pulse 75, temperature 98.8 F (37.1 C), temperature source Oral, resp. rate 18, SpO2 100 %.There is no weight on file to calculate BMI.  General Appearance: Casual  Eye Contact::  Good  Speech:  Clear and Coherent  Volume:  Normal  Mood:  Depressed, Hopeless, Worthless and helpless  Affect:  Congruent, Depressed, Flat and Tearful  Thought Process:  Coherent  Orientation:  Full (Time, Place, and Person)  Thought Content:  suicidal  Suicidal Thoughts:  Yes.  without intent/plan  Homicidal Thoughts:  No  Memory:  Immediate;   Good Recent;   Good Remote;   Good  Judgement:  Fair  Insight:  Good  Psychomotor Activity:  Normal  Concentration:  Good  Recall:  NA  Fund of Knowledge:Good  Language: Good  Akathisia:  NA  Handed:  Right  AIMS (if indicated):     Assets:  Desire for Improvement  ADL's:  Intact  Cognition: WNL  Sleep:      Medical Decision Making: Established Problem, Worsening (2), Review of Medication Regimen & Side Effects (2) and Review of New Medication or Change in Dosage (2)  Treatment Plan Summary: Daily contact with patient to assess and evaluate symptoms and progress in treatment, Medication management and Plan Admit to inpatient Psychiatric unit  Plan:  Recommend psychiatric Inpatient admission when medically cleared. Accepted at [facility] Disposition: [place], [place], C 04/11/2014 2:34 PM  ADDENDUM: Patient was informed of his admission to [facility].  Patient did not want to go to [facility] and he became very angry, threatening staff and started pacing and yelling.  Patient, during his assessment earlier today was tearful, cried out loud and stated that he is very depressed, feels hopeless and helpless.  Because he does not want to go to [facility] patient is now asking to be discharged.  Call placed to his ACT team is not returned at this time.  Dr Dwyane Dee was contacted who said to offer patient overnight stay till reevaluated in  the morning.  Dr Darleene Cleaver who saw saw this patient in the morning also asked for patient to be offered voluntarily overnight stay till he is seen in the morning.  Patient refused all offers and  is now IVC till the morning when he will be seen by by the Psychiatrist.  Patient threatened any staff member that came close to him.  We will use our emergency medication for his stabilization.  Patient will be re-evaluated in am.  Charmaine Downs   PMHNP-BC Patient seen, evaluated and I agree with notes by Nurse Practitioner. Corena Pilgrim, MD

## 2014-04-11 NOTE — BHH Counselor (Signed)
Per Claudette Head pt accepted to [facility] by Dr. Bary Leriche. Bed assignments pending.  Bedelia Person, M.S., LPCA, [facility] Licensed Professional Counselor Associate  Triage Specialist  [facility]  Therapeutic Triage Services Phone: [phone] Fax: [phone]

## 2014-04-12 DIAGNOSIS — R45851 Suicidal ideations: Secondary | ICD-10-CM

## 2014-04-12 DIAGNOSIS — R45850 Homicidal ideations: Secondary | ICD-10-CM

## 2014-04-12 DIAGNOSIS — F332 Major depressive disorder, recurrent severe without psychotic features: Secondary | ICD-10-CM | POA: Diagnosis not present

## 2014-04-12 DIAGNOSIS — F322 Major depressive disorder, single episode, severe without psychotic features: Secondary | ICD-10-CM | POA: Diagnosis not present

## 2014-04-12 NOTE — BHH Suicide Risk Assessment (Signed)
Suicide Risk Assessment  Discharge Assessment   [facility] Discharge Suicide Risk Assessment   Demographic Factors:  Male and Living alone  Total Time spent with patient: 30 minutes  Musculoskeletal: Strength & Muscle Tone: within normal limits Gait & Station: normal Patient leans: N/A  Psychiatric Specialty Exam:     Blood pressure 122/85, pulse 75, temperature 98.5 F (36.9 C), temperature source Oral, resp. rate 18, SpO2 98 %.There is no weight on file to calculate BMI.  General Appearance: Casual  Eye Contact::  Good  Speech:  Normal Rate  Volume:  Normal  Mood:  Anxious, mild  Affect:  Congruent  Thought Process:  Coherent  Orientation:  Full (Time, Place, and Person)  Thought Content:  WDL  Suicidal Thoughts:  No  Homicidal Thoughts:  No  Memory:  Immediate;   Good Recent;   Good Remote;   Good  Judgement:  Fair  Insight:  Fair  Psychomotor Activity:  Normal  Concentration:  Good  Recall:  Good  Fund of Knowledge:Good  Language: Good  Akathisia:  No  Handed:  Right  AIMS (if indicated):     Assets:  Catering manager Housing Leisure Time Physical Health Resilience Social Support  ADL's:  Intact  Cognition: WNL  Sleep:         Has this patient used any form of tobacco in the last 30 days? (Cigarettes, Smokeless Tobacco, Cigars, and/or Pipes) Yes, A prescription for an FDA-approved tobacco cessation medication was offered at discharge and the patient refused  Mental Status Per Nursing Assessment::   On Admission:   "bad feelings"  Current Mental Status by Physician: NA  Loss Factors: NA  Historical Factors: NA  Risk Reduction Factors:   Sense of responsibility to family, Positive social support and Positive therapeutic relationship  Continued Clinical Symptoms:  Anxiety, mild  Cognitive Features That Contribute To Risk:  None    Suicide Risk:  Minimal: No identifiable suicidal ideation.  Patients presenting with no risk factors  but with morbid ruminations; may be classified as minimal risk based on the severity of the depressive symptoms  Principal Problem: Suicidal ideations Discharge Diagnoses:  Patient Active Problem List   Diagnosis Date Noted  . Suicidal ideations [R45.851] 04/12/2014    Priority: High  . Homicidal ideations [R45.850] 04/12/2014    Priority: High  . Major depressive disorder, recurrent severe without psychotic features [F33.2] 04/11/2014    Priority: High  . Chest pain at rest [R07.9] 08/26/2013  . Protein-calorie malnutrition, severe [E43] 08/26/2013  . Cervical radicular pain [M54.12] 03/25/2013  . Neck pain [M54.2] 03/24/2013  . Chest pain [R07.9] 08/04/2012  . HTN (hypertension) [I10] 08/04/2012  . Hyperlipidemia [E78.5] 08/04/2012  . Tobacco abuse [Z72.0] 08/04/2012      Plan Of Care/Follow-up recommendations:  Activity:  as tolerated Diet:  heart healthy diet  Is patient on multiple antipsychotic therapies at discharge:  No   Has Patient had three or more failed trials of antipsychotic monotherapy by history:  No  Recommended Plan for Multiple Antipsychotic Therapies: NA    Paul Brady, PMH-NP 04/12/2014, 1:55 PM

## 2014-04-12 NOTE — ED Notes (Signed)
Pt calm, cooperative, resting at present.

## 2014-04-12 NOTE — Consult Note (Signed)
[facility] Consult   Reason for Consult:  Suicidal ideations Referring Physician:  EDP Patient Identification: Paul Brady MRN:  [id number] Principal Diagnosis: Suicidal ideations Diagnosis:   Patient Active Problem List   Diagnosis Date Noted  . Suicidal ideations [R45.851] 04/12/2014    Priority: High  . Homicidal ideations [R45.850] 04/12/2014    Priority: High  . Major depressive disorder, recurrent severe without psychotic features [F33.2] 04/11/2014    Priority: High  . Chest pain at rest [R07.9] 08/26/2013  . Protein-calorie malnutrition, severe [E43] 08/26/2013  . Cervical radicular pain [M54.12] 03/25/2013  . Neck pain [M54.2] 03/24/2013  . Chest pain [R07.9] 08/04/2012  . HTN (hypertension) [I10] 08/04/2012  . Hyperlipidemia [E78.5] 08/04/2012  . Tobacco abuse [Z72.0] 08/04/2012    Total Time spent with patient: 30 minutes  Subjective:   Paul Brady is a 59 y.o. male patient has stabilized and can discharge.  HPI:  The patient stated he was having bad feelings yesterday but feels "Ok" today.  Denies suicidal/homicidal ideations, no hallucinations, and no alcohol/drug abuse except marijuana.  He lives by himself and has a place to go.  Paul Brady was scheduled to go to [facility] for [facility] yesterday but refused.  Today, he is ready to leave with his ACT team, [facility].  Stable for discharge. HPI Elements:   Location:  generalized. Quality:  acute. Severity:  mild. Timing:  intermittent. Duration:  brief. Context:  stressors.  Past Medical History:  Past Medical History  Diagnosis Date  . Depression   . Hypertension   . Hyperlipidemia   . Chest pain   . Shortness of breath   . GERD (gastroesophageal reflux disease)   . ZSWFUXNA(355.7)     Past Surgical History  Procedure Laterality Date  . Wrist fusion Right 04/2012  . Anterior cervical decomp/discectomy fusion N/A 03/24/2013    Procedure: ACDF C5 -  C7 2 LEVELS;  Surgeon: Melina Schools, MD;  Location: [place];  Service: Orthopedics;  Laterality: N/A;   Family History: No family history on file. Social History:  History  Alcohol Use No     History  Drug Use  . Yes    History   Social History  . Marital Status: Legally Separated    Spouse Name: N/A    Number of Children: N/A  . Years of Education: N/A   Social History Main Topics  . Smoking status: Current Every Day Smoker -- 0.50 packs/day for 25 years    Types: Cigarettes  . Smokeless tobacco: Never Used  . Alcohol Use: No  . Drug Use: Yes  . Sexual Activity: None   Other Topics Concern  . None   Social History Narrative   Additional Social History:    History of alcohol / drug use?: Yes Name of Substance 1: THC  1 - Age of First Use: 14 1 - Amount (size/oz): varies  1 - Frequency: daily  1 - Duration: ongoing  1 - Last Use / Amount: 04-08-14                   Allergies:  No Known Allergies  Vitals: Blood pressure 122/85, pulse 75, temperature 98.5 F (36.9 C), temperature source Oral, resp. rate 18, SpO2 98 %.  Risk to Self: Suicidal Ideation: Yes-Currently Present Suicidal Intent: Yes-Currently Present Is patient at risk for suicide?: Yes Suicidal Plan?: Yes-Currently Present Specify Current Suicidal Plan: "Plenty of ways, I could walk out into traffic" Access to Means:  Yes Specify Access to Suicidal Means: Pt has access to traffic.  What has been your use of drugs/alcohol within the last 12 months?: Pt reported that he smokes THC daily.  How many times?: 0 Other Self Harm Risks: No other self harm risk identified at this time.  Triggers for Past Attempts: None known Intentional Self Injurious Behavior: None Risk to Others: Homicidal Ideation: Yes-Currently Present Thoughts of Harm to Others: Yes-Currently Present Comment - Thoughts of Harm to Others: "Choke the living shit out of them" Current Homicidal Intent: Yes-Currently Present Current  Homicidal Plan: Yes-Currently Present Describe Current Homicidal Plan: "Choke the living shit out of them".  Access to Homicidal Means: Yes Describe Access to Homicidal Means: PT could use his hands to choke someone. Identified Victim: Pt refuse to provide a name only stated "they just get on my nerves asking me for everything". History of harm to others?: No Assessment of Violence: On admission Violent Behavior Description: No violent behaviors observed. Pt is calm and cooperative at this time. ' Does patient have access to weapons?: No Criminal Charges Pending?: No Does patient have a court date: No Prior Inpatient Therapy: Prior Inpatient Therapy: Yes Prior Therapy Dates: 2012 Prior Therapy Facilty/Provider(s): [facility] Reason for Treatment: Depression  Prior Outpatient Therapy: Prior Outpatient Therapy: Yes Prior Therapy Dates: 2015 Prior Therapy Facilty/Provider(s): [facility] Reason for Treatment: Medication management  Current Facility-Administered Medications  Medication Dose Route Frequency Provider Last Rate Last Dose  . acetaminophen (TYLENOL) tablet 650 mg  650 mg Oral Q4H PRN Paul Breach, PA-C      . carvedilol (COREG) tablet 3.125 mg  3.125 mg Oral BID WC Paul Brady   3.125 mg at 04/12/14 0836  . chlorthalidone (HYGROTON) tablet 25 mg  25 mg Oral Daily Paul Brady   25 mg at 04/12/14 0930  . diphenhydrAMINE (BENADRYL) injection 25 mg  25 mg Intramuscular Once Paul Gant, NP   25 mg at 04/12/14 0407  . FLUoxetine (PROZAC) capsule 10 mg  10 mg Oral Daily Paul Brady   10 mg at 04/12/14 0930  . hydrOXYzine (ATARAX/VISTARIL) tablet 25 mg  25 mg Oral TID PRN Paul Brady   25 mg at 04/11/14 2144  . LORazepam (ATIVAN) injection 2 mg  2 mg Intramuscular Once Paul Gant, NP   2 mg at 04/12/14 0407  . LORazepam (ATIVAN) tablet 1 mg  1 mg Oral Q8H PRN Paul Breach, PA-C   1 mg at 04/11/14 2144  . nicotine (NICODERM CQ - dosed in mg/24 hours)  patch 21 mg  21 mg Transdermal Daily Paul Breach, PA-C   21 mg at 04/12/14 0933  . nitroGLYCERIN (NITROSTAT) SL tablet 0.4 mg  0.4 mg Sublingual Q5 min PRN Paul Brady      . ondansetron (ZOFRAN) tablet 4 mg  4 mg Oral Q8H PRN Paul Breach, PA-C      . QUEtiapine (SEROQUEL) tablet 400 mg  400 mg Oral QHS Paul Brady   400 mg at 04/11/14 2144  . ziprasidone (GEODON) injection 20 mg  20 mg Intramuscular Once Paul Gant, NP   20 mg at 04/12/14 0406   Current Outpatient Prescriptions  Medication Sig Dispense Refill  . ALPRAZolam (XANAX) 0.25 MG tablet Take 0.25 mg by mouth 2 (two) times daily as needed for anxiety (anxiety).     [name] amLODipine (NORVASC) 5 MG tablet Take 5 mg by mouth daily.    [name] buPROPion (WELLBUTRIN) 75 MG tablet Take 75 mg  by mouth at bedtime.   0  . carvedilol (COREG) 3.125 MG tablet Take 3.125 mg by mouth 2 (two) times daily with a meal.   0  . chlorthalidone (HYGROTON) 25 MG tablet Take 25 mg by mouth daily.   0  . HYDROcodone-acetaminophen (NORCO) 10-325 MG per tablet Take 1 tablet by mouth every 6 (six) hours as needed (pain).   0  . lisinopril (PRINIVIL,ZESTRIL) 5 MG tablet Take 5 mg by mouth daily.   0  . nitroGLYCERIN (NITROSTAT) 0.4 MG SL tablet Place 0.4 mg under the tongue every 5 (five) minutes as needed for chest pain (chest pain).     . QUEtiapine (SEROQUEL) 400 MG tablet Take 400 mg by mouth at bedtime.     . risperiDONE (RISPERDAL) 2 MG tablet Take 2 mg by mouth at bedtime.    . traMADol (ULTRAM) 50 MG tablet Take 1 tablet (50 mg total) by mouth every 6 (six) hours as needed. (Patient not taking: Reported on 02/27/2014) 15 tablet 0    Musculoskeletal: Strength & Muscle Tone: within normal limits Gait & Station: normal Patient leans: N/A  Psychiatric Specialty Exam:     Blood pressure 122/85, pulse 75, temperature 98.5 F (36.9 C), temperature source Oral, resp. rate 18, SpO2 98 %.There is no weight on file to calculate BMI.  General  Appearance: Casual  Eye Contact::  Good  Speech:  Normal Rate  Volume:  Normal  Mood:  Anxious, mild  Affect:  Congruent  Thought Process:  Coherent  Orientation:  Full (Time, Place, and Person)  Thought Content:  WDL  Suicidal Thoughts:  No  Homicidal Thoughts:  No  Memory:  Immediate;   Good Recent;   Good Remote;   Good  Judgement:  Fair  Insight:  Fair  Psychomotor Activity:  Normal  Concentration:  Good  Recall:  Good  Fund of Knowledge:Good  Language: Good  Akathisia:  No  Handed:  Right  AIMS (if indicated):     Assets:  Catering manager Housing Leisure Time Physical Health Resilience Social Support  ADL's:  Intact  Cognition: WNL  Sleep:      Medical Decision Making: Established Problem, Stable/Improving (1), Review of Psycho-Social Stressors (1) and Review of Medication Regimen & Side Effects (2)  Treatment Plan Summary: Daily contact with patient to assess and evaluate symptoms and progress in treatment, Medication management and Plan discharge home to his ACT team  Plan:  No evidence of imminent risk to self or others at present.   Disposition: Discharge home to his ACT team and follow-up with his regular providers.  Paul Brady, [place] 04/12/2014 1:24 PM  Patient seen, evaluated and I agree with notes by Nurse Practitioner. Paul Pilgrim, MD

## 2014-04-12 NOTE — ED Notes (Signed)
I went in room 40 and called patient's name 4 times he did not acknowledge writer so she can obtain his vitals. He did start breathing harder like he was sleep so writer can leave the room. Writer let the RN Latricia know what had happened.

## 2014-04-12 NOTE — BH Assessment (Signed)
[facility] Assessment Progress Note  Per Corena Pilgrim, MD, pt is to be released from [facility] and discharged from [facility].  Pt receives ACT Team services from [facility], and they are to be called to pick the pt up.  At 10:15 I called [facility] [phone]); they agree to send staff to retrieve the pt.  Pt's nurse, Randall Hiss, has been notified.  Jalene Mullet, MA Triage Specialist 04/12/2014 @ 10:19

## 2014-04-21 ENCOUNTER — Emergency Department (HOSPITAL_COMMUNITY)
Admission: EM | Admit: 2014-04-21 | Discharge: 2014-04-21 | Disposition: A | Discharge status: Home / Self Care | Payer: Medicare HMO | Attending: Emergency Medicine | Admitting: Emergency Medicine

## 2014-04-21 ENCOUNTER — Encounter (HOSPITAL_COMMUNITY): Payer: Self-pay | Admitting: Emergency Medicine

## 2014-04-21 DIAGNOSIS — Z792 Long term (current) use of antibiotics: Secondary | ICD-10-CM | POA: Diagnosis not present

## 2014-04-21 DIAGNOSIS — Z8639 Personal history of other endocrine, nutritional and metabolic disease: Secondary | ICD-10-CM | POA: Diagnosis not present

## 2014-04-21 DIAGNOSIS — K0889 Other specified disorders of teeth and supporting structures: Secondary | ICD-10-CM

## 2014-04-21 DIAGNOSIS — Z8719 Personal history of other diseases of the digestive system: Secondary | ICD-10-CM | POA: Insufficient documentation

## 2014-04-21 DIAGNOSIS — I10 Essential (primary) hypertension: Secondary | ICD-10-CM | POA: Diagnosis not present

## 2014-04-21 DIAGNOSIS — Z720 Tobacco use: Secondary | ICD-10-CM | POA: Diagnosis not present

## 2014-04-21 DIAGNOSIS — F329 Major depressive disorder, single episode, unspecified: Secondary | ICD-10-CM | POA: Insufficient documentation

## 2014-04-21 DIAGNOSIS — K088 Other specified disorders of teeth and supporting structures: Secondary | ICD-10-CM | POA: Insufficient documentation

## 2014-04-21 DIAGNOSIS — Z79899 Other long term (current) drug therapy: Secondary | ICD-10-CM | POA: Insufficient documentation

## 2014-04-21 MED ORDER — AMOXICILLIN 500 MG PO CAPS: 500.0000 mg | ORAL_CAPSULE | Freq: Three times a day (TID) | ORAL | Status: DC

## 2014-04-21 MED ORDER — HYDROCODONE-ACETAMINOPHEN 5-325 MG PO TABS: 1.0000 | ORAL_TABLET | Freq: Four times a day (QID) | ORAL | Status: DC | PRN

## 2014-04-21 NOTE — Care Management (Signed)
ED CM received call from patient stating he left his discharge paperwork along with prescriptions on the bus. CM reviewed record, patient received Vicodin and Amoxicillin. Explained to patient that we would need to investigate the prior to replacing prescriptions, patient hung up and called back shortly reporting that he found his prescriptions.

## 2014-04-21 NOTE — ED Provider Notes (Signed)
CSN: [id number]     Arrival date & time 04/21/14  1549 History  This chart was scribed for Cherylann Parr, PA-C working with Threasa Beards, MD by Randa Evens, ED Scribe. This patient was seen in room TR05C/TR05C and the patient's care was started at 4:24 PM.     Chief Complaint  Patient presents with  . Dental Pain   Patient is a 59 y.o. male presenting with tooth pain. The history is provided by the patient. No language interpreter was used.  Dental Pain Associated symptoms: no facial swelling and no fever    HPI Comments: Paul Brady is a 59 y.o. male who presents to the Emergency Department complaining of left sided upper dental pain onset this morning. Pt states that he feels as if the top rear molar is loose. Pt states that he may be grinding or clinching his teeth at night while sleeping. Pt states that it is painful to chew. Pt states that there is a filling placed in the tooth. Pt denies any medications PTA. Pt states that he does not have a dentist. Pt denies fever, chills, nausea, vomiting, trouble swallowing, or facial swelling. Pt states that he is an everyday smoker.   Past Medical History  Diagnosis Date  . Depression   . Hypertension   . Hyperlipidemia   . Chest pain   . Shortness of breath   . GERD (gastroesophageal reflux disease)   . RXYVOPFY(924.4)    Past Surgical History  Procedure Laterality Date  . Wrist fusion Right 04/2012  . Anterior cervical decomp/discectomy fusion N/A 03/24/2013    Procedure: ACDF C5 - C7 2 LEVELS;  Surgeon: Melina Schools, MD;  Location: [place];  Service: Orthopedics;  Laterality: N/A;   No family history on file. History  Substance Use Topics  . Smoking status: Current Every Day Smoker -- 0.50 packs/day for 25 years    Types: Cigarettes  . Smokeless tobacco: Never Used  . Alcohol Use: No    Review of Systems  Constitutional: Negative for fever and chills.  HENT: Positive for dental problem. Negative for facial swelling  and trouble swallowing.   Gastrointestinal: Negative for nausea and vomiting.  All other systems reviewed and are negative.    Allergies  Review of patient's allergies indicates no known allergies.  Home Medications   Prior to Admission medications   Medication Sig Start Date End Date Taking? Authorizing Provider  ALPRAZolam (XANAX) 0.25 MG tablet Take 0.25 mg by mouth 2 (two) times daily as needed for anxiety (anxiety).     Historical Provider, MD  amLODipine (NORVASC) 5 MG tablet Take 5 mg by mouth daily.    Historical Provider, MD  amoxicillin (AMOXIL) 500 MG capsule Take 1 capsule (500 mg total) by mouth 3 (three) times daily. 04/21/14   Breionna Punt A Forcucci, PA-C  buPROPion (WELLBUTRIN) 75 MG tablet Take 75 mg by mouth at bedtime.  12/16/13   Historical Provider, MD  carvedilol (COREG) 3.125 MG tablet Take 3.125 mg by mouth 2 (two) times daily with a meal.  01/19/14   Historical Provider, MD  chlorthalidone (HYGROTON) 25 MG tablet Take 25 mg by mouth daily.  01/19/14   Historical Provider, MD  HYDROcodone-acetaminophen (NORCO/VICODIN) 5-325 MG per tablet Take 1 tablet by mouth every 6 (six) hours as needed. 04/21/14   Jillane Po A Forcucci, PA-C  lisinopril (PRINIVIL,ZESTRIL) 5 MG tablet Take 5 mg by mouth daily.  01/19/14   Historical Provider, MD  nitroGLYCERIN (NITROSTAT) 0.4 MG SL  tablet Place 0.4 mg under the tongue every 5 (five) minutes as needed for chest pain (chest pain).     Historical Provider, MD  QUEtiapine (SEROQUEL) 400 MG tablet Take 400 mg by mouth at bedtime.     Historical Provider, MD  risperiDONE (RISPERDAL) 2 MG tablet Take 2 mg by mouth at bedtime.    Historical Provider, MD  traMADol (ULTRAM) 50 MG tablet Take 1 tablet (50 mg total) by mouth every 6 (six) hours as needed. Patient not taking: Reported on 02/27/2014 12/21/13   Blanchie Dessert, MD   BP 153/102 mmHg  Pulse 75  Temp(Src) 98 F (36.7 C) (Oral)  Resp 22  Ht 6\' 2"  (1.88 m)  Wt 198 lb (89.812 kg)   BMI 25.41 kg/m2  SpO2 99%   Physical Exam  Constitutional: He is oriented to person, place, and time. He appears well-developed and well-nourished. No distress.  HENT:  Head: Normocephalic and atraumatic.  Nose: Nose normal.  Mouth/Throat: Uvula is midline. No trismus in the jaw.  Back left upper second molar is loose has filling in teeth, no gingival erythema, no evidence of abscess.  Eyes: Conjunctivae and EOM are normal. Pupils are equal, round, and reactive to light.  Neck: Neck supple. No tracheal deviation present.  Cardiovascular: Normal rate and regular rhythm.  Exam reveals no gallop and no friction rub.   No murmur heard. Pulmonary/Chest: Effort normal and breath sounds normal. No respiratory distress.  Musculoskeletal: Normal range of motion.  Neurological: He is alert and oriented to person, place, and time.  Skin: Skin is warm and dry.  Psychiatric: He has a normal mood and affect. His behavior is normal.  Nursing note and vitals reviewed.   ED Course  Procedures (including critical care time) DIAGNOSTIC STUDIES: Oxygen Saturation is 99% on RA, normal by my interpretation.    COORDINATION OF CARE: 4:44 PM-Discussed treatment plan with pt at bedside and pt agreed to plan.     Labs Review Labs Reviewed - No data to display  Imaging Review No results found.   EKG Interpretation None      MDM   Final diagnoses:  Pain, dental  Loosening of tooth   Patient's 59 year old male who presents emergency room for evaluation of upper left dental pain and looseness of the tooth. Patient has had a previous history fillings in the tooth and appears to have some. Periodontal disease. Patient also has a history of grinding his teeth and no longer has a mouth guard. Suspect that this may be accommodation of periodontal disease versus decay versus possible infection. Can't rule out periapical abscess. Will start on hydrocodone for pain and will give amoxicillin to cover for  infection. Patient follow-up with dentist. We'll give dental resource list. Patient to return for trismus, drooling, shortness breath, or facial swelling. He states understanding and agreement at this time. Patient stable for discharge. I have counseled the patient stop smoking.  I personally performed the services described in this documentation, which was scribed in my presence. The recorded information has been reviewed and is accurate.     Cherylann Parr, PA-C 04/21/14 [place], MD 04/21/14 [phone]

## 2014-04-21 NOTE — Discharge Instructions (Signed)
Dental Pain  Toothache is pain in or around a tooth. It may get worse with chewing or with cold or heat.   HOME CARE  · Your dentist may use a numbing medicine during treatment. If so, you may need to avoid eating until the medicine wears off. Ask your dentist about this.  · Only take medicine as told by your dentist or doctor.  · Avoid chewing food near the painful tooth until after all treatment is done. Ask your dentist about this.  GET HELP RIGHT AWAY IF:   · The problem gets worse or new problems appear.  · You have a fever.  · There is redness and puffiness (swelling) of the face, jaw, or neck.  · You cannot open your mouth.  · There is pain in the jaw.  · There is very bad pain that is not helped by medicine.  MAKE SURE YOU:   · Understand these instructions.  · Will watch your condition.  · Will get help right away if you are not doing well or get worse.  Document Released: 08/14/2007 Document Revised: 05/20/2011 Document Reviewed: 08/14/2007  ExitCare® Patient Information ©2015 [employer]. This information is not intended to replace advice given to you by your health care provider. Make sure you discuss any questions you have with your health care provider.

## 2014-04-21 NOTE — ED Notes (Signed)
Pt from home for eval of left sided dental pain that started this morning, pt states he does not have a dentist to see. Denies any fevers, chills, n/v. NAD noted. Airway intact

## 2014-05-23 ENCOUNTER — Emergency Department (HOSPITAL_COMMUNITY): Payer: Medicare HMO

## 2014-05-23 ENCOUNTER — Emergency Department (HOSPITAL_COMMUNITY)
Admission: EM | Admit: 2014-05-23 | Discharge: 2014-05-23 | Disposition: A | Discharge status: Home / Self Care | Payer: Medicare HMO | Attending: Emergency Medicine | Admitting: Emergency Medicine

## 2014-05-23 ENCOUNTER — Encounter (HOSPITAL_COMMUNITY): Payer: Self-pay

## 2014-05-23 DIAGNOSIS — Z79899 Other long term (current) drug therapy: Secondary | ICD-10-CM | POA: Insufficient documentation

## 2014-05-23 DIAGNOSIS — M792 Neuralgia and neuritis, unspecified: Secondary | ICD-10-CM | POA: Diagnosis not present

## 2014-05-23 DIAGNOSIS — R519 Headache, unspecified: Secondary | ICD-10-CM

## 2014-05-23 DIAGNOSIS — R51 Headache: Secondary | ICD-10-CM | POA: Insufficient documentation

## 2014-05-23 DIAGNOSIS — F329 Major depressive disorder, single episode, unspecified: Secondary | ICD-10-CM | POA: Diagnosis not present

## 2014-05-23 DIAGNOSIS — R0602 Shortness of breath: Secondary | ICD-10-CM | POA: Insufficient documentation

## 2014-05-23 DIAGNOSIS — Z720 Tobacco use: Secondary | ICD-10-CM | POA: Insufficient documentation

## 2014-05-23 DIAGNOSIS — Z8639 Personal history of other endocrine, nutritional and metabolic disease: Secondary | ICD-10-CM | POA: Diagnosis not present

## 2014-05-23 DIAGNOSIS — Z8719 Personal history of other diseases of the digestive system: Secondary | ICD-10-CM | POA: Insufficient documentation

## 2014-05-23 DIAGNOSIS — Z792 Long term (current) use of antibiotics: Secondary | ICD-10-CM | POA: Diagnosis not present

## 2014-05-23 LAB — CBC WITH DIFFERENTIAL/PLATELET
BASOS PCT: 0 % (ref 0–1)
Basophils Absolute: 0 10*3/uL (ref 0.0–0.1)
Eosinophils Absolute: 0.1 10*3/uL (ref 0.0–0.7)
Eosinophils Relative: 2 % (ref 0–5)
HEMATOCRIT: 42.4 % (ref 39.0–52.0)
HEMOGLOBIN: 14.3 g/dL (ref 13.0–17.0)
Lymphocytes Relative: 33 % (ref 12–46)
Lymphs Abs: 2.6 10*3/uL (ref 0.7–4.0)
MCH: 28.8 pg (ref 26.0–34.0)
MCHC: 33.7 g/dL (ref 30.0–36.0)
MCV: 85.5 fL (ref 78.0–100.0)
MONOS PCT: 7 % (ref 3–12)
Monocytes Absolute: 0.5 10*3/uL (ref 0.1–1.0)
NEUTROS ABS: 4.6 10*3/uL (ref 1.7–7.7)
Neutrophils Relative %: 58 % (ref 43–77)
Platelets: 235 10*3/uL (ref 150–400)
RBC: 4.96 MIL/uL (ref 4.22–5.81)
RDW: 15.4 % (ref 11.5–15.5)
WBC: 7.8 10*3/uL (ref 4.0–10.5)

## 2014-05-23 LAB — BASIC METABOLIC PANEL
Anion gap: 5 (ref 5–15)
BUN: 12 mg/dL (ref 6–23)
CO2: 28 mmol/L (ref 19–32)
Calcium: 9.3 mg/dL (ref 8.4–10.5)
Chloride: 105 mmol/L (ref 96–112)
Creatinine, Ser: 0.99 mg/dL (ref 0.50–1.35)
GFR calc Af Amer: >90 mL/min (ref >90)
GFR, EST NON AFRICAN AMERICAN: 89 mL/min — AB (ref >90)
Glucose, Bld: 98 mg/dL (ref 70–99)
POTASSIUM: 4.3 mmol/L (ref 3.5–5.1)
Sodium: 138 mmol/L (ref 135–145)

## 2014-05-23 LAB — TROPONIN I: Troponin I: <0.03 ng/mL (ref <0.031)

## 2014-05-23 LAB — BRAIN NATRIURETIC PEPTIDE: B Natriuretic Peptide: 29.4 pg/mL (ref 0.0–100.0)

## 2014-05-23 MED ORDER — CARBAMAZEPINE 200 MG PO TABS: 200.0000 mg | ORAL_TABLET | Freq: Two times a day (BID) | ORAL | Status: DC

## 2014-05-23 NOTE — ED Notes (Signed)
Pt with headache and fatigue starting today.  Headache comes and goes.  No fever.  No unilateral weakness

## 2014-05-23 NOTE — Discharge Instructions (Signed)
General Headache Without Cause  A general headache is pain or discomfort felt around the head or neck area. The cause may not be found.   HOME CARE   · Keep all doctor visits.  · Only take medicines as told by your doctor.  · Lie down in a dark, quiet room when you have a headache.  · Keep a journal to find out if certain things bring on headaches. For example, write down:  ¨ What you eat and drink.  ¨ How much sleep you get.  ¨ Any change to your diet or medicines.  · Relax by getting a massage or doing other relaxing activities.  · Put ice or heat packs on the head and neck area as told by your doctor.  · Lessen stress.  · Sit up straight. Do not tighten (tense) your muscles.  · Quit smoking if you smoke.  · Lessen how much alcohol you drink.  · Lessen how much caffeine you drink, or stop drinking caffeine.  · Eat and sleep on a regular schedule.  · Get 7 to 9 hours of sleep, or as told by your doctor.  · Keep lights dim if bright lights bother you or make your headaches worse.  GET HELP RIGHT AWAY IF:   · Your headache becomes really bad.  · You have a fever.  · You have a stiff neck.  · You have trouble seeing.  · Your muscles are weak, or you lose muscle control.  · You lose your balance or have trouble walking.  · You feel like you will pass out (faint), or you pass out.  · You have really bad symptoms that are different than your first symptoms.  · You have problems with the medicines given to you by your doctor.  · Your medicines do not work.  · Your headache feels different than the other headaches.  · You feel sick to your stomach (nauseous) or throw up (vomit).  MAKE SURE YOU:   · Understand these instructions.  · Will watch your condition.  · Will get help right away if you are not doing well or get worse.  Document Released: 12/05/2007 Document Revised: 05/20/2011 Document Reviewed: 02/15/2011  ExitCare® Patient Information ©2015 [employer]. This information is not intended to replace advice given to  you by your health care provider. Make sure you discuss any questions you have with your health care provider.

## 2014-05-23 NOTE — ED Provider Notes (Signed)
CSN: [id number]     Arrival date & time 05/23/14  0944 History   First MD Initiated Contact with Patient 05/23/14 1002     Chief Complaint  Patient presents with  . Headache     (Consider location/radiation/quality/duration/timing/severity/associated sxs/prior Treatment) HPI Comments: Patient reports that he woke up this morning with a headache. He reports a sharp and stabbing pain that shoots across the right side of his head. It lasts for 5 or 10 seconds and then resolves. When it is present he feels dizzy. He denies any injury. He has not had fever, neck pain or stiffness.  Patient reports that he has noticed that he feels slightly short of breath today as well. He has not had any chest pain. No palpitations or leg swelling. He has not had fever or cough.  Patient is a 59 y.o. male presenting with headaches.  Headache Associated symptoms: dizziness     Past Medical History  Diagnosis Date  . Depression   . Hypertension   . Hyperlipidemia   . Chest pain   . Shortness of breath   . GERD (gastroesophageal reflux disease)   . FTDDUKGU(542.7)    Past Surgical History  Procedure Laterality Date  . Wrist fusion Right 04/2012  . Anterior cervical decomp/discectomy fusion N/A 03/24/2013    Procedure: ACDF C5 - C7 2 LEVELS;  Surgeon: Melina Schools, MD;  Location: [facility];  Service: Orthopedics;  Laterality: N/A;   History reviewed. No pertinent family history. History  Substance Use Topics  . Smoking status: Current Every Day Smoker -- 0.50 packs/day for 25 years    Types: Cigarettes  . Smokeless tobacco: Never Used  . Alcohol Use: No    Review of Systems  Respiratory: Positive for shortness of breath.   Neurological: Positive for dizziness and headaches.  All other systems reviewed and are negative.     Allergies  Review of patient's allergies indicates no known allergies.  Home Medications   Prior to Admission medications   Medication Sig Start Date End Date Taking?  Authorizing Provider  ALPRAZolam (XANAX) 0.25 MG tablet Take 0.25 mg by mouth 2 (two) times daily as needed for anxiety (anxiety).    Yes Historical Provider, MD  amLODipine (NORVASC) 5 MG tablet Take 5 mg by mouth daily.   Yes Historical Provider, MD  buPROPion (WELLBUTRIN) 75 MG tablet Take 75 mg by mouth at bedtime.  12/16/13  Yes Historical Provider, MD  carvedilol (COREG) 3.125 MG tablet Take 3.125 mg by mouth 2 (two) times daily with a meal.  01/19/14  Yes Historical Provider, MD  chlorthalidone (HYGROTON) 25 MG tablet Take 25 mg by mouth daily.  01/19/14  Yes Historical Provider, MD  lisinopril (PRINIVIL,ZESTRIL) 5 MG tablet Take 5 mg by mouth daily.  01/19/14  Yes Historical Provider, MD  Multiple Vitamin (MULTIVITAMIN WITH MINERALS) TABS tablet Take 1 tablet by mouth daily.   Yes Historical Provider, MD  nitroGLYCERIN (NITROSTAT) 0.4 MG SL tablet Place 0.4 mg under the tongue every 5 (five) minutes as needed for chest pain (chest pain).    Yes Historical Provider, MD  QUEtiapine (SEROQUEL) 400 MG tablet Take 400 mg by mouth at bedtime.    Yes Historical Provider, MD  amoxicillin (AMOXIL) 500 MG capsule Take 1 capsule (500 mg total) by mouth 3 (three) times daily. Patient not taking: Reported on 05/23/2014 04/21/14   Starlyn Skeans, PA-C  HYDROcodone-acetaminophen (NORCO/VICODIN) 5-325 MG per tablet Take 1 tablet by mouth every 6 (six) hours as  needed. Patient not taking: Reported on 05/23/2014 04/21/14   Loma Sousa Forcucci, PA-C  traMADol (ULTRAM) 50 MG tablet Take 1 tablet (50 mg total) by mouth every 6 (six) hours as needed. Patient not taking: Reported on 02/27/2014 12/21/13   Blanchie Dessert, MD   BP 140/87 mmHg  Pulse 61  Temp(Src) 97.8 F (36.6 C) (Oral)  Resp 16  Ht 6' 2.5" (1.892 m)  Wt 199 lb (90.266 kg)  BMI 25.22 kg/m2  SpO2 100% Physical Exam  Constitutional: He is oriented to person, place, and time. He appears well-developed and well-nourished. No distress.  HENT:   Head: Normocephalic and atraumatic.  Right Ear: Hearing normal.  Left Ear: Hearing normal.  Nose: Nose normal.  Mouth/Throat: Oropharynx is clear and moist and mucous membranes are normal.  Eyes: Conjunctivae and EOM are normal. Pupils are equal, round, and reactive to light.  Neck: Normal range of motion. Neck supple.  Cardiovascular: Regular rhythm, S1 normal and S2 normal.  Exam reveals no gallop and no friction rub.   No murmur heard. Pulmonary/Chest: Effort normal and breath sounds normal. No respiratory distress. He exhibits no tenderness.  Abdominal: Soft. Normal appearance and bowel sounds are normal. There is no hepatosplenomegaly. There is no tenderness. There is no rebound, no guarding, no tenderness at McBurney's point and negative Murphy's sign. No hernia.  Musculoskeletal: Normal range of motion.  Neurological: He is alert and oriented to person, place, and time. He has normal strength. No cranial nerve deficit or sensory deficit. Coordination normal. GCS eye subscore is 4. GCS verbal subscore is 5. GCS motor subscore is 6.  Extraocular muscle movement: normal No visual field cut Pupils: equal and reactive both direct and consensual response is normal No nystagmus present    Sensory function is intact to light touch, pinprick Proprioception intact  Grip strength 5/5 symmetric in upper extremities No pronator drift Normal finger to nose bilaterally  Lower extremity strength 5/5 against gravity Normal heel to shin bilaterally  Gait: normal Rhomberg: Normal  Skin: Skin is warm, dry and intact. No rash noted. No cyanosis.  Psychiatric: He has a normal mood and affect. His speech is normal and behavior is normal. Thought content normal.  Nursing note and vitals reviewed.   ED Course  Procedures (including critical care time) Labs Review Labs Reviewed  BASIC METABOLIC PANEL - Abnormal; Notable for the following:    GFR calc non Af Amer 89 (*)    All other components  within normal limits  CBC WITH DIFFERENTIAL/PLATELET  TROPONIN I  BRAIN NATRIURETIC PEPTIDE    Imaging Review Dg Chest 2 View  05/23/2014   CLINICAL DATA:  Headache.  Cough and congestion.  EXAM: CHEST  2 VIEW  COMPARISON:  02/27/2014  FINDINGS: Normal heart size and mediastinal contours. No acute infiltrate or edema. No effusion or pneumothorax. No acute osseous findings.  IMPRESSION: No active cardiopulmonary disease.   Electronically Signed   By: Monte Fantasia M.D.   On: 05/23/2014 10:46   Ct Head Wo Contrast  05/23/2014   CLINICAL DATA:  Headache and fatigue beginning today.  EXAM: CT HEAD WITHOUT CONTRAST  TECHNIQUE: Contiguous axial images were obtained from the base of the skull through the vertex without intravenous contrast.  COMPARISON:  Brain MRI 10/03/2013  FINDINGS: There is no evidence of acute cortical infarct, intracranial hemorrhage, mass, midline shift, or extra-axial fluid collection. Ventricles and sulci are within normal limits for age.  Abnormal contour of the posterior right globe is unchanged.  Mastoid air cells and visualized paranasal sinuses are clear.  IMPRESSION: Unremarkable CT appearance of the brain for age.   Electronically Signed   By: Logan Bores   On: 05/23/2014 10:49     EKG Interpretation   Date/Time:  Monday May 23 2014 10:44:39 EDT Ventricular Rate:  60 PR Interval:  148 QRS Duration: 109 QT Interval:  415 QTC Calculation: 415 R Axis:   55 Text Interpretation:  Sinus rhythm Atrial premature complex RSR' in V1 or  V2, right VCD or RVH ST elevation consider anterior injury or acute  infarct No significant change since last tracing Confirmed by Honor Frison  MD,  [place] [phone]) on 05/23/2014 10:57:40 AM      MDM   Final diagnoses:  Headache   Patient presents to the ER for evaluation of headache. He has been experiencing atypical headaches since awakening this morning. He has a sharp, stabbing, lancinating type pain that occurs  intermittently on the right side of his head only. It only last for 5 or 10 seconds and then resolves. His neurologic examination is normal. No vision change. No neurologic findings on examination. CT head was unremarkable. This is most consistent with a neuralgia of the scalp. Will treat follow-up with neurology.  Patient did complain of weakness and shortness of breath. He is not hypoxic or tachypnea. Vital signs are all normal. Cardiac evaluation is negative. No concern for congestive heart failure. Patient will be referred back to his primary care doctor.   Orpah Greek, MD 05/23/14 1332

## 2014-05-23 NOTE — ED Notes (Signed)
Patient transported to CT 

## 2014-06-21 ENCOUNTER — Emergency Department (HOSPITAL_COMMUNITY)
Admission: EM | Admit: 2014-06-21 | Discharge: 2014-06-21 | Disposition: A | Discharge status: Home / Self Care | Payer: Medicare HMO | Attending: Emergency Medicine | Admitting: Emergency Medicine

## 2014-06-21 ENCOUNTER — Emergency Department (HOSPITAL_COMMUNITY): Payer: Medicare HMO

## 2014-06-21 ENCOUNTER — Encounter (HOSPITAL_COMMUNITY): Payer: Self-pay | Admitting: Emergency Medicine

## 2014-06-21 ENCOUNTER — Ambulatory Visit: Payer: Medicare HMO | Admitting: Neurology

## 2014-06-21 DIAGNOSIS — Z79899 Other long term (current) drug therapy: Secondary | ICD-10-CM | POA: Diagnosis not present

## 2014-06-21 DIAGNOSIS — M545 Low back pain: Secondary | ICD-10-CM | POA: Insufficient documentation

## 2014-06-21 DIAGNOSIS — Z8719 Personal history of other diseases of the digestive system: Secondary | ICD-10-CM | POA: Insufficient documentation

## 2014-06-21 DIAGNOSIS — I10 Essential (primary) hypertension: Secondary | ICD-10-CM | POA: Insufficient documentation

## 2014-06-21 DIAGNOSIS — F329 Major depressive disorder, single episode, unspecified: Secondary | ICD-10-CM | POA: Insufficient documentation

## 2014-06-21 DIAGNOSIS — Z792 Long term (current) use of antibiotics: Secondary | ICD-10-CM | POA: Diagnosis not present

## 2014-06-21 DIAGNOSIS — Z720 Tobacco use: Secondary | ICD-10-CM | POA: Insufficient documentation

## 2014-06-21 DIAGNOSIS — Z8639 Personal history of other endocrine, nutritional and metabolic disease: Secondary | ICD-10-CM | POA: Insufficient documentation

## 2014-06-21 DIAGNOSIS — H538 Other visual disturbances: Secondary | ICD-10-CM | POA: Diagnosis present

## 2014-06-21 LAB — CBC WITH DIFFERENTIAL/PLATELET
Basophils Absolute: 0 10*3/uL (ref 0.0–0.1)
Basophils Relative: 0 % (ref 0–1)
EOS PCT: 1 % (ref 0–5)
Eosinophils Absolute: 0.1 10*3/uL (ref 0.0–0.7)
HEMATOCRIT: 41.3 % (ref 39.0–52.0)
HEMOGLOBIN: 13.9 g/dL (ref 13.0–17.0)
LYMPHS ABS: 3 10*3/uL (ref 0.7–4.0)
LYMPHS PCT: 32 % (ref 12–46)
MCH: 28.8 pg (ref 26.0–34.0)
MCHC: 33.7 g/dL (ref 30.0–36.0)
MCV: 85.7 fL (ref 78.0–100.0)
MONO ABS: 0.5 10*3/uL (ref 0.1–1.0)
MONOS PCT: 6 % (ref 3–12)
Neutro Abs: 5.9 10*3/uL (ref 1.7–7.7)
Neutrophils Relative %: 61 % (ref 43–77)
PLATELETS: 240 10*3/uL (ref 150–400)
RBC: 4.82 MIL/uL (ref 4.22–5.81)
RDW: 15.4 % (ref 11.5–15.5)
WBC: 9.5 10*3/uL (ref 4.0–10.5)

## 2014-06-21 LAB — BASIC METABOLIC PANEL
Anion gap: 8 (ref 5–15)
BUN: <5 mg/dL — ABNORMAL LOW (ref 6–23)
CO2: 27 mmol/L (ref 19–32)
Calcium: 9.1 mg/dL (ref 8.4–10.5)
Chloride: 107 mmol/L (ref 96–112)
Creatinine, Ser: 1.02 mg/dL (ref 0.50–1.35)
GFR calc Af Amer: >90 mL/min (ref >90)
GFR calc non Af Amer: 80 mL/min — ABNORMAL LOW (ref >90)
GLUCOSE: 78 mg/dL (ref 70–99)
Potassium: 3.9 mmol/L (ref 3.5–5.1)
SODIUM: 142 mmol/L (ref 135–145)

## 2014-06-21 MED ORDER — TRAMADOL HCL 50 MG PO TABS: 50.0000 mg | ORAL_TABLET | Freq: Four times a day (QID) | ORAL | Status: DC | PRN

## 2014-06-21 MED ORDER — HYDROCODONE-ACETAMINOPHEN 5-325 MG PO TABS: 2.0000 | ORAL_TABLET | ORAL | Status: DC | PRN

## 2014-06-21 MED ORDER — NAPROXEN 500 MG PO TABS: 500.0000 mg | ORAL_TABLET | Freq: Two times a day (BID) | ORAL | Status: DC

## 2014-06-21 MED ORDER — METHOCARBAMOL 500 MG PO TABS: 500.0000 mg | ORAL_TABLET | Freq: Two times a day (BID) | ORAL | Status: DC | PRN

## 2014-06-21 NOTE — Discharge Instructions (Signed)
Blurred Vision You have been seen today complaining of blurred vision. This means you have a loss of ability to see small details.  CAUSES  Blurred vision can be a symptom of underlying eye problems, such as:  Aging of the eye (presbyopia).  Glaucoma.  Cataracts.  Eye infection.  Eye-related migraine.  Diabetes mellitus.  Fatigue.  Migraine headaches.  High blood pressure.  Breakdown of the back of the eye (macular degeneration).  Problems caused by some medications. The most common cause of blurred vision is the need for eyeglasses or a new prescription. Today in the emergency department, no cause for your blurred vision can be found. SYMPTOMS  Blurred vision is the loss of visual sharpness and detail (acuity). DIAGNOSIS  Should blurred vision continue, you should see your caregiver. If your caregiver is your primary care physician, he or she may choose to refer you to another specialist.  TREATMENT  Do not ignore your blurred vision. Make sure to have it checked out to see if further treatment or referral is necessary. SEEK MEDICAL CARE IF:  You are unable to get into a specialist so we can help you with a referral. SEEK IMMEDIATE MEDICAL CARE IF: You have severe eye pain, severe headache, or sudden loss of vision. MAKE SURE YOU:   Understand these instructions.  Will watch your condition.  Will get help right away if you are not doing well or get worse. Document Released: 02/28/2003 Document Revised: 05/20/2011 Document Reviewed: 09/30/2007 [facility] Patient Information 2015 [place], [place]. This information is not intended to replace advice given to you by your health care provider. Make sure you discuss any questions you have with your health care provider.  Visual Disturbances You have had a disturbance in your vision. This may be caused by various conditions, such as:  Migraines. Migraine headaches are often preceded by a disturbance in vision. Blind spots or  light flashes are followed by a headache. This type of visual disturbance is temporary. It does not damage the eye.  Glaucoma. This is caused by increased pressure in the eye. Symptoms include haziness, blurred vision, or seeing rainbow colored circles when looking at bright lights. Partial or complete visual loss can occur. You may or may not experience eye pain. Visual loss may be gradual or sudden and is irreversible. Glaucoma is the leading cause of blindness.  Retina problems. Vision will be reduced if the retina becomes detached or if there is a circulation problem as with diabetes, high blood pressure, or a mini-stroke. Symptoms include seeing "floaters," flashes of light, or shadows, as if a curtain has fallen over your eye.  Optic nerve problems. The main nerve in your eye can be damaged by redness, soreness, and swelling (inflammation), poor circulation, drugs, and toxins. It is very important to have a complete exam done by a specialist to determine the exact cause of your eye problem. The specialist may recommend medicines or surgery, depending on the cause of the problem. This can help prevent further loss of vision or reduce the risk of having a stroke. Contact the caregiver to whom you have been referred and arrange for follow-up care right away. SEEK IMMEDIATE MEDICAL CARE IF:   Your vision gets worse.  You develop severe headaches.  You have any weakness or numbness in the face, arms, or legs.  You have any trouble speaking or walking. Document Released: 04/04/2004 Document Revised: 05/20/2011 Document Reviewed: 07/26/2009 [facility] Patient Information 2015 [place], [place]. This information is not intended to  replace advice given to you by your health care provider. Make sure you discuss any questions you have with your health care provider. It is important for you to follow-up with your neurologist for further evaluation and management of your symptoms. Return to ED for new or  worsening symptoms. Please take your medications as directed for your back pain. Do not take your narcotic pain medicine or Robaxin while driving or operating machinery.

## 2014-06-21 NOTE — ED Provider Notes (Signed)
CSN: [id number]     Arrival date & time 06/21/14  1017 History   First MD Initiated Contact with Patient 06/21/14 1115     Chief Complaint  Patient presents with  . Visual Field Change     (Consider location/radiation/quality/duration/timing/severity/associated sxs/prior Treatment) HPI Paul Brady is a 59 y.o. male who comes in for evaluation of blurred vision. Patient states Sunday evening he began to experience "floaters" in his left eye. 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 He does not have an eye doctor. He reports Monday morning there was associated blurred vision with the floaters and today the floaters have gotten bigger. He denies any eye pain, new headaches, numbness or weakness, nausea or vomiting, chest pain or shortness of breath, slurred speech, difficult swallowing. Patient also reports moving furniture this past weekend and now complains of back and abdominal pain, thinks he may have a hernia. He has not tried anything to improve the symptoms. Denies any difficulties urinating, fevers, other abdominal pain.  Reports he has neurology appointment today at 1:30 PM  Past Medical History  Diagnosis Date  . Depression   . Hypertension   . Hyperlipidemia   . Chest pain   . Shortness of breath   . GERD (gastroesophageal reflux disease)   . KDTOIZTI(458.0)    Past Surgical History  Procedure Laterality Date  . Wrist fusion Right 04/2012  . Anterior cervical decomp/discectomy fusion N/A 03/24/2013    Procedure: ACDF C5 - C7 2 LEVELS;  Surgeon: Melina Schools, MD;  Location: [facility];  Service: Orthopedics;  Laterality: N/A;   History reviewed. No pertinent family history. History  Substance Use Topics  . Smoking status: Current Every Day Smoker -- 0.50 packs/day for 25 years    Types: Cigarettes  . Smokeless tobacco: Never Used  . Alcohol Use: No    Review of Systems A 10 point review of systems was completed and was negative except for pertinent  positives and negatives as mentioned in the history of present illness     Allergies  Trazodone and nefazodone  Home Medications   Prior to Admission medications   Medication Sig Start Date End Date Taking? Authorizing Provider  ALPRAZolam (XANAX) 0.25 MG tablet Take 0.25 mg by mouth 2 (two) times daily as needed for anxiety (anxiety).    Yes Historical Provider, MD  amLODipine (NORVASC) 5 MG tablet Take 5 mg by mouth daily.   Yes Historical Provider, MD  buPROPion (WELLBUTRIN) 75 MG tablet Take 75 mg by mouth at bedtime.  12/16/13  Yes Historical Provider, MD  carvedilol (COREG) 3.125 MG tablet Take 3.125 mg by mouth 2 (two) times daily with a meal.  01/19/14  Yes Historical Provider, MD  chlorthalidone (HYGROTON) 25 MG tablet Take 25 mg by mouth daily.  01/19/14  Yes Historical Provider, MD  lisinopril (PRINIVIL,ZESTRIL) 5 MG tablet Take 5 mg by mouth daily.  01/19/14  Yes Historical Provider, MD  Multiple Vitamin (MULTIVITAMIN WITH MINERALS) TABS tablet Take 1 tablet by mouth daily.   Yes Historical Provider, MD  nitroGLYCERIN (NITROSTAT) 0.4 MG SL tablet Place 0.4 mg under the tongue every 5 (five) minutes as needed for chest pain (chest pain).    Yes Historical Provider, MD  QUEtiapine (SEROQUEL) 400 MG tablet Take 400 mg by mouth at bedtime.    Yes Historical Provider, MD  amoxicillin (AMOXIL) 500 MG capsule Take 1 capsule (500 mg total) by mouth 3 (three) times daily. Patient not taking: Reported on 05/23/2014  04/21/14   Courtney Forcucci, PA-C  carbamazepine (TEGRETOL) 200 MG tablet Take 1 tablet (200 mg total) by mouth 2 (two) times daily. Patient not taking: Reported on 06/21/2014 05/23/14   Orpah Greek, MD  HYDROcodone-acetaminophen (NORCO/VICODIN) 5-325 MG per tablet Take 2 tablets by mouth every 4 (four) hours as needed. 06/21/14   Comer Locket, PA-C  methocarbamol (ROBAXIN) 500 MG tablet Take 1 tablet (500 mg total) by mouth 2 (two) times daily as needed for muscle  spasms. 06/21/14   Comer Locket, PA-C  naproxen (NAPROSYN) 500 MG tablet Take 1 tablet (500 mg total) by mouth 2 (two) times daily. 06/21/14   Comer Locket, PA-C   BP 147/78 mmHg  Pulse 74  Temp(Src) 97.8 F (36.6 C) (Oral)  Resp 16  SpO2 99% Physical Exam  Constitutional: He is oriented to person, place, and time. He appears well-developed and well-nourished.  HENT:  Head: Normocephalic and atraumatic.  Mouth/Throat: Oropharynx is clear and moist.  Eyes: Conjunctivae are normal. Pupils are equal, round, and reactive to light. Right eye exhibits no discharge. Left eye exhibits no discharge. No scleral icterus.  Extraocular movements intact without nystagmus. Inferior Visual field deficits in left eye. No periorbital tenderness  Neck: Neck supple.  Cardiovascular: Normal rate, regular rhythm and normal heart sounds.   Pulmonary/Chest: Effort normal and breath sounds normal. No respiratory distress. He has no wheezes. He has no rales.  Abdominal: Soft. He exhibits no distension and no mass. There is no tenderness. There is no rebound and no guarding.  Genitourinary:  No appreciation of inguinal or scrotal hernia  Musculoskeletal: He exhibits no tenderness.  Diffuse tenderness throughout paraspinal lumbar region with no overt midline bony tenderness. Maintains full active range of motion of all 4 extremities area gait is baseline without ataxia.  Neurological: He is alert and oriented to person, place, and time.  Cranial Nerves II-XII grossly intact. Motor and sensation 5/5 in all 4 extremities. Completes finger to nose and heel to shin coordination movements without difficulty. No pronator drift.  Skin: Skin is warm and dry. No rash noted.  Psychiatric: He has a normal mood and affect.  Nursing note and vitals reviewed.   ED Course  Procedures (including critical care time) Ultrasound: Limited Ocular  Performed and interpreted by Jaquita Folds   Indication: Blurred  vision/floaters Using high frequency linear probe, ultrasound of the globe was performed in real time in two planes with patient looking left and right.  Interpretation: No retinal detachment visualized.  Lens was in proper location.  No hemorrhage appreciated.  Images were attempted to be electronically archived, but machine is full.     Labs Review Labs Reviewed  BASIC METABOLIC PANEL - Abnormal; Notable for the following:    BUN <5 (*)    GFR calc non Af Amer 80 (*)    All other components within normal limits  CBC WITH DIFFERENTIAL/PLATELET    Imaging Review Ct Head Wo Contrast  06/21/2014   CLINICAL DATA:  Blurred vision in left eye.  EXAM: CT HEAD WITHOUT CONTRAST  TECHNIQUE: Contiguous axial images were obtained from the base of the skull through the vertex without intravenous contrast.  COMPARISON:  May 23, 2014.  FINDINGS: Bony calvarium appears intact. No mass effect or midline shift is noted. Ventricular size is within normal limits. There is no evidence of mass lesion, hemorrhage or acute infarction.  IMPRESSION: Normal head CT.   Electronically Signed   By: Marijo Conception, M.D.  On: 06/21/2014 14:37     EKG Interpretation None     Meds given in ED:  Medications - No data to display  New Prescriptions   HYDROCODONE-ACETAMINOPHEN (NORCO/VICODIN) 5-325 MG PER TABLET    Take 2 tablets by mouth every 4 (four) hours as needed.   METHOCARBAMOL (ROBAXIN) 500 MG TABLET    Take 1 tablet (500 mg total) by mouth 2 (two) times daily as needed for muscle spasms.   NAPROXEN (NAPROSYN) 500 MG TABLET    Take 1 tablet (500 mg total) by mouth 2 (two) times daily.   Filed Vitals:   06/21/14 1335 06/21/14 1345 06/21/14 1400 06/21/14 1521  BP: 141/89 136/86 140/81 147/78  Pulse: 74 80 79 74  Temp:      TempSrc:      Resp: 14   16  SpO2: 100% 100% 99% 99%    MDM  Vitals stable - WNL -afebrile Pt resting comfortably in ED. PE--neuro exam not concerning. Patient has poor visual  acuity which is baseline for him. Labwork noncontributory Imaging-CT head negative for any acute intracranial abnormality. No evidence of acute ocular pathology on ultrasound.  DDX--we'll have patient follow-up with his neurologist and PCP for further evaluation and management of symptoms. Also discussed follow-up with ophthalmologist for formal eye exam. No evidence of glaucoma, vitreous rupture, lens dislocation, retinal detachment. Doubt CVA or other vascular compromise. Will DC with anti-inflammatories, muscle relaxers and short course pain medicines for likely lumbosacral strain. No evidence of other acute or emergent pathology at this time.  I discussed all relevant lab findings and imaging results with pt and they verbalized understanding. Discussed f/u with PCP within 48 hrs and return precautions, pt very amenable to plan. Patient stable, in good condition and ambulates out of the ED without difficulty or antalgia.  Prior to patient discharge, I discussed and reviewed this case with Dr. Wilson Singer   Final diagnoses:  Blurred vision, left eye        Comer Locket, PA-C 06/21/14 [place], MD 06/22/14 [phone]

## 2014-06-21 NOTE — ED Notes (Signed)
PT having floaters starting Sunday with spotters and floaters and worsening to blurred vision. Does not have eye doctor. Also complaining of back pain and pain in left groin and leg pain. Hx of hernia in that area. Was doing having lifting over the weekend.

## 2014-06-21 NOTE — ED Notes (Signed)
Patient returned from Radiology. 

## 2014-06-22 ENCOUNTER — Telehealth: Payer: Self-pay | Admitting: Neurology

## 2014-06-22 ENCOUNTER — Encounter: Payer: Self-pay | Admitting: Neurology

## 2014-06-22 NOTE — Telephone Encounter (Signed)
NP ER referral, no showed w/ Dr. Posey Pronto on 06/21/14. No show letter mailed to pt / Paul Brady

## 2014-06-28 ENCOUNTER — Ambulatory Visit (INDEPENDENT_AMBULATORY_CARE_PROVIDER_SITE_OTHER): Payer: Medicare HMO | Admitting: Neurology

## 2014-06-28 ENCOUNTER — Encounter: Payer: Self-pay | Admitting: *Deleted

## 2014-06-28 ENCOUNTER — Encounter: Payer: Self-pay | Admitting: Neurology

## 2014-06-28 VITALS — BP 130/84 | HR 67 | Ht 74.5 in | Wt 193.4 lb

## 2014-06-28 DIAGNOSIS — M5481 Occipital neuralgia: Secondary | ICD-10-CM

## 2014-06-28 DIAGNOSIS — H539 Unspecified visual disturbance: Secondary | ICD-10-CM

## 2014-06-28 MED ORDER — TIZANIDINE HCL 2 MG PO TABS: 2.0000 mg | ORAL_TABLET | Freq: Three times a day (TID) | ORAL | Status: DC | PRN

## 2014-06-28 MED ORDER — GABAPENTIN 300 MG PO CAPS: 300.0000 mg | ORAL_CAPSULE | Freq: Every day | ORAL | Status: DC

## 2014-06-28 NOTE — Progress Notes (Signed)
Note sent

## 2014-06-28 NOTE — Progress Notes (Signed)
[facility] Clinic Note - Initial Visit   Date: 06/28/2014   Paul Brady MRN: [id number] DOB: Jul 11, 1955   Dear Dr. Jeanie Cooks:  Thank you for your kind referral of Paul Brady for consultation of headaches and vision changes. Although his history is well known to you, please allow [place] to reiterate it for the purpose of our medical record. The patient was accompanied to the clinic by self.    History of Present Illness: Paul Brady is a 59 y.o. 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  Patient reports having headaches that started in March 2016.  It is located at the base of his right neck that shoots into his head, pain lasts < 6 hours.  Frequency is variable and occurs several times per week.  He has noticed that stress triggers it and makes it worse.  Sleep and rest helps his pain.  He tried ibuprofen, excedrin migraine, and heat/ice which did not help.  He complains of mild tenderness over the base of the headache.    Starting in April, he started seeing floaters and blurred vision of his left eye.  Denies any problems with color desaturation. He saw the eye doctor yesterday and is scheduled to see a retina specialist today.  Patient has had six emergency room visits since October 2015 for various problems including chest pain, suicidal ideation, headaches, and most likely for vision changes.  Prior neurological evaluation has included MRI brain (09/2013) and CT head (05/2014, 06/2014) which was normal.     Out-side paper records, electronic medical record, and images have been reviewed where available and summarized as:  CT head 05/23/2014 and 06/21/2014:  Normal  MRI brain 10/03/2013:   No evidence of infarct or other acute intracranial abnormality.  Lab Results    Component Value Date   TSH 0.458 08/04/2012   No results found for: [id number] Lab Results  Component Value Date   HGBA1C 5.3 08/04/2012     Past Medical History  Diagnosis Date  . Depression   . Hypertension   . Hyperlipidemia   . Chest pain   . Shortness of breath   . GERD (gastroesophageal reflux disease)   . ZHGDJMEQ(683.4)     Past Surgical History  Procedure Laterality Date  . Wrist fusion Right 04/2012  . Anterior cervical decomp/discectomy fusion N/A 03/24/2013    Procedure: ACDF C5 - C7 2 LEVELS;  Surgeon: Melina Schools, MD;  Location: [facility];  Service: Orthopedics;  Laterality: N/A;     Medications:  Current Outpatient Prescriptions on File Prior to Visit  Medication Sig Dispense Refill  . ALPRAZolam (XANAX) 0.25 MG tablet Take 0.25 mg by mouth 2 (two) times daily as needed for anxiety (anxiety).     [name] amLODipine (NORVASC) 5 MG tablet Take 5 mg by mouth daily.    [name] buPROPion (WELLBUTRIN) 75 MG tablet Take 75 mg by mouth at bedtime.   0  . carbamazepine (TEGRETOL) 200 MG tablet Take 1 tablet (200 mg total) by mouth 2 (two) times daily. 30 tablet 0  . carvedilol (COREG) 3.125 MG tablet Take 3.125 mg by mouth 2 (two) times daily with a meal.   0  . lisinopril (PRINIVIL,ZESTRIL) 5 MG tablet Take 5 mg by mouth daily.   0  . methocarbamol (ROBAXIN) 500 MG tablet Take 1 tablet (500 mg total) by mouth 2 (two)  times daily as needed for muscle spasms. 20 tablet 0  . Multiple Vitamin (MULTIVITAMIN WITH MINERALS) TABS tablet Take 1 tablet by mouth daily.    . naproxen (NAPROSYN) 500 MG tablet Take 1 tablet (500 mg total) by mouth 2 (two) times daily. 30 tablet 0  . nitroGLYCERIN (NITROSTAT) 0.4 MG SL tablet Place 0.4 mg under the tongue every 5 (five) minutes as needed for chest pain (chest pain).     . QUEtiapine (SEROQUEL) 400 MG tablet Take 400 mg by mouth at bedtime.      No current facility-administered medications on file prior to visit.    Allergies:  Allergies   Allergen Reactions  . Trazodone And Nefazodone     priapism    Family History: Family History  Problem Relation Age of Onset  . Hypertension Mother     Social History: History   Social History  . Marital Status: Legally Separated    Spouse Name: N/A  . Number of Children: N/A  . Years of Education: N/A   Occupational History  . Not on file.   Social History Main Topics  . Smoking status: Current Every Day Smoker -- 0.50 packs/day for 25 years    Types: Cigarettes  . Smokeless tobacco: Never Used  . Alcohol Use: No  . Drug Use: No  . Sexual Activity: Not on file   Other Topics Concern  . Not on file   Social History Narrative   Lives alone in a one story home.  Has 2 sons.  Does not work,  On disability.    Review of Systems:  CONSTITUTIONAL: No fevers, chills, night sweats, or weight loss.   EYES: No visual changes or eye pain ENT: No hearing changes.  No history of nose bleeds.   RESPIRATORY: No cough, wheezing and shortness of breath.   CARDIOVASCULAR: Negative for chest pain, and palpitations.   GI: Negative for abdominal discomfort, blood in stools or black stools.  No recent change in bowel habits.   GU:  No history of incontinence.   MUSCLOSKELETAL: No history of joint pain or swelling.  No myalgias.   SKIN: Negative for lesions, rash, and itching.   HEMATOLOGY/ONCOLOGY: Negative for prolonged bleeding, bruising easily, and swollen nodes.  No history of cancer.   ENDOCRINE: Negative for cold or heat intolerance, polydipsia or goiter.   PSYCH:  ++depression or anxiety symptoms.   NEURO: As Above.   Vital Signs:  BP 130/84 mmHg  Pulse 67  Ht 6' 2.5" (1.892 m)  Wt 193 lb 6 oz (87.714 kg)  BMI 24.50 kg/m2  SpO2 99%   General Medical Exam:   General:  Well appearing, comfortable.   Eyes/ENT: see cranial nerve examination.  Point tenderness over the right GON.  Neck:  No carotid bruits. Respiratory:  Clear to auscultation, good air entry  bilaterally.   Cardiac:  Regular rate and rhythm, no murmur.   Extremities:  No deformities, edema, or skin discoloration.  Skin:  No rashes or lesions.  Neurological Exam: MENTAL STATUS including orientation to time, place, person, recent and remote memory, attention span and concentration, language, and fund of knowledge is normal.  Speech is not dysarthric.  CRANIAL NERVES: II:  No visual field defects.  Dense cataract right >> left, making fundioscopic exam difficult III-IV-VI: Right pupil is 57mm and left pupil is 83mm and minimally reactive (reports having dilating drops placed yesterday for eye exam). Normal conjugate, extra-ocular eye movements in all directions of gaze.  No  nystagmus.  No ptosis.   V:  Normal facial sensation.   VII:  Normal facial symmetry and movements.  No pathologic facial reflexes.  VIII:  Normal hearing and vestibular function.   IX-X:  Normal palatal movement.   XI:  Normal shoulder shrug and head rotation.   XII:  Normal tongue strength and range of motion, no deviation or fasciculation.  MOTOR:  No atrophy, fasciculations or abnormal movements.  No pronator drift.  Tone is normal.   Lefthip pain limits hip flexion ROM, but strength is intact.  Right Upper Extremity:    Left Upper Extremity:    Deltoid  5/5   Deltoid  5/5   Biceps  5/5   Biceps  5/5   Triceps  5/5   Triceps  5/5   Wrist extensors  5/5   Wrist extensors  5/5   Wrist flexors  5/5   Wrist flexors  5/5   Finger extensors  5/5   Finger extensors  5/5   Finger flexors  5/5   Finger flexors  5/5   Dorsal interossei  5/5   Dorsal interossei  5/5   Abductor pollicis  5/5   Abductor pollicis  5/5   Tone (Ashworth scale)  0  Tone (Ashworth scale)  0   Right Lower Extremity:    Left Lower Extremity:    Hip flexors  5/5   Hip flexors  5/5   Hip extensors  5/5   Hip extensors  5/5   Knee flexors  5/5   Knee flexors  5/5   Knee extensors  5/5   Knee extensors  5/5   Dorsiflexors  5/5    Dorsiflexors  5/5   Plantarflexors  5/5   Plantarflexors  5/5   Toe extensors  5/5   Toe extensors  5/5   Toe flexors  5/5   Toe flexors  5/5   Tone (Ashworth scale)  0  Tone (Ashworth scale)  0   MSRs:  Right                                                                 Left brachioradialis 2+  brachioradialis 2+  biceps 2+  biceps 2+  triceps 2+  triceps 2+  patellar 1+  patellar 1+  ankle jerk 1+  ankle jerk 1+  Hoffman no  Hoffman no  plantar response down  plantar response down   SENSORY:  Normal and symmetric perception of light touch, pinprick, vibration, and proprioception.  Romberg's sign absent.   COORDINATION/GAIT: Normal finger-to- nose-finger and heel-to-shin.  Intact rapid alternating movements bilaterally.  Able to rise from a chair without using arms.  Gait narrow based and stable. Tandem and stressed gait intact.    IMPRESSION: Right greater occipital neuralgia  - Recommended occipital nerve block, but patient not interested due to needle phobia  - Alternatively, will start him on neurontin 300mg  qhs and add zanaflex 2mg  as needed for severe pain  - Instructed to call the office to schedule nerve block if pain becomes worse Asymmetrically minimally reactive pupils is most likely due medication induced  Left vision changes, seeing a retinal specialist today  - Prior brain imaging has been reviewed and does not show any intracranial abnormalities suggestive of demyelinating  disease  Return to clinic in 3 months   The duration of this appointment visit was 40 minutes of face-to-face time with the patient.  Greater than 50% of this time was spent in counseling, explanation of diagnosis, planning of further management, and coordination of care.   Thank you for allowing me to participate in patient's care.  If I can answer any additional questions, I would be pleased to do so.    Sincerely,    Donika K. Posey Pronto, DO

## 2014-06-28 NOTE — Patient Instructions (Addendum)
1. Start gabapentin 300mg  at bedtime 2. OK take zanaflex 2mg  as needed for headaches 3. If no improvement, call my office to schedule nerve block 4. Follow-up with eye doctor regarding eye symptoms 5. Return to clinic in 3 months

## 2014-06-30 ENCOUNTER — Emergency Department (HOSPITAL_COMMUNITY): Payer: Medicare HMO

## 2014-06-30 ENCOUNTER — Emergency Department (HOSPITAL_COMMUNITY)
Admission: EM | Admit: 2014-06-30 | Discharge: 2014-06-30 | Disposition: A | Discharge status: Home / Self Care | Payer: Medicare HMO | Attending: Emergency Medicine | Admitting: Emergency Medicine

## 2014-06-30 DIAGNOSIS — R1033 Periumbilical pain: Secondary | ICD-10-CM | POA: Diagnosis present

## 2014-06-30 DIAGNOSIS — Z79899 Other long term (current) drug therapy: Secondary | ICD-10-CM | POA: Diagnosis not present

## 2014-06-30 DIAGNOSIS — F329 Major depressive disorder, single episode, unspecified: Secondary | ICD-10-CM | POA: Diagnosis not present

## 2014-06-30 DIAGNOSIS — Z720 Tobacco use: Secondary | ICD-10-CM | POA: Insufficient documentation

## 2014-06-30 DIAGNOSIS — Z8639 Personal history of other endocrine, nutritional and metabolic disease: Secondary | ICD-10-CM | POA: Diagnosis not present

## 2014-06-30 DIAGNOSIS — K567 Ileus, unspecified: Secondary | ICD-10-CM | POA: Insufficient documentation

## 2014-06-30 DIAGNOSIS — I10 Essential (primary) hypertension: Secondary | ICD-10-CM | POA: Insufficient documentation

## 2014-06-30 LAB — CBC WITH DIFFERENTIAL/PLATELET
Basophils Absolute: 0 10*3/uL (ref 0.0–0.1)
Basophils Relative: 0 % (ref 0–1)
Eosinophils Absolute: 0.1 10*3/uL (ref 0.0–0.7)
Eosinophils Relative: 1 % (ref 0–5)
HEMATOCRIT: 42 % (ref 39.0–52.0)
HEMOGLOBIN: 14.4 g/dL (ref 13.0–17.0)
LYMPHS ABS: 2.7 10*3/uL (ref 0.7–4.0)
LYMPHS PCT: 21 % (ref 12–46)
MCH: 29.6 pg (ref 26.0–34.0)
MCHC: 34.3 g/dL (ref 30.0–36.0)
MCV: 86.2 fL (ref 78.0–100.0)
MONO ABS: 1.2 10*3/uL — AB (ref 0.1–1.0)
MONOS PCT: 9 % (ref 3–12)
NEUTROS ABS: 8.9 10*3/uL — AB (ref 1.7–7.7)
Neutrophils Relative %: 69 % (ref 43–77)
Platelets: 235 10*3/uL (ref 150–400)
RBC: 4.87 MIL/uL (ref 4.22–5.81)
RDW: 14.8 % (ref 11.5–15.5)
WBC: 12.9 10*3/uL — ABNORMAL HIGH (ref 4.0–10.5)

## 2014-06-30 LAB — BASIC METABOLIC PANEL
Anion gap: 8 (ref 5–15)
BUN: 12 mg/dL (ref 6–23)
CHLORIDE: 105 mmol/L (ref 96–112)
CO2: 25 mmol/L (ref 19–32)
Calcium: 9.3 mg/dL (ref 8.4–10.5)
Creatinine, Ser: 1 mg/dL (ref 0.50–1.35)
GFR calc Af Amer: >90 mL/min (ref >90)
GFR calc non Af Amer: 82 mL/min — ABNORMAL LOW (ref >90)
Glucose, Bld: 80 mg/dL (ref 70–99)
POTASSIUM: 4.1 mmol/L (ref 3.5–5.1)
SODIUM: 138 mmol/L (ref 135–145)

## 2014-06-30 LAB — URINALYSIS, ROUTINE W REFLEX MICROSCOPIC
Bilirubin Urine: NEGATIVE
Glucose, UA: NEGATIVE mg/dL
Hgb urine dipstick: NEGATIVE
Ketones, ur: NEGATIVE mg/dL
LEUKOCYTES UA: NEGATIVE
Nitrite: NEGATIVE
PROTEIN: NEGATIVE mg/dL
Specific Gravity, Urine: 1.009 (ref 1.005–1.030)
UROBILINOGEN UA: 0.2 mg/dL (ref 0.0–1.0)
pH: 6.5 (ref 5.0–8.0)

## 2014-06-30 MED ORDER — MORPHINE SULFATE 4 MG/ML IJ SOLN
4.0000 mg | Freq: Once | INTRAMUSCULAR | Status: AC
Start: 2014-06-30 — End: 2014-06-30
  Administered 2014-06-30: 4 mg via INTRAVENOUS
  Filled 2014-06-30: qty 1

## 2014-06-30 MED ORDER — OXYCODONE-ACETAMINOPHEN 5-325 MG PO TABS: 2.0000 | ORAL_TABLET | ORAL | Status: DC | PRN

## 2014-06-30 NOTE — ED Notes (Signed)
Bed: WA02 Expected date:  Expected time:  Means of arrival:  Comments: abd pain

## 2014-06-30 NOTE — ED Notes (Signed)
Per [facility], patient c/o worsening abd pain @ umbilicus, states this is ongoing for 1 month. Patient states he had BM this am, was diarrhea. Denies constipation. Patient guarding, abd soft.

## 2014-06-30 NOTE — Discharge Instructions (Signed)
Ileus Follow up with GI. The intestine (bowel, or gut) is a long, muscular tube connecting your stomach to your rectum. If the intestine stops working, food cannot pass through. This is called an ileus. This can happen for a variety of reasons. Ileus is a major medical problem that usually requires hospitalization. If your intestine stops working because of a blockage, this is called a bowel obstruction and is a different condition. CAUSES   Surgery in your abdomen. This can last from a few hours to a few days.  An infection or inflammation in the belly (abdomen). This includes inflammation of the lining of the abdomen (peritonitis).  Infection or inflammation in other parts of the body, such as pneumonia or pancreatitis.  Passage of gallstones or kidney stones.  Damage to the nerves or blood vessels which go to the bowel.  Imbalance in the salts in the blood (electrolytes).  Injury to the brain and/or spinal cord.  Medications. Many medications can cause ileus or make it worse. The most common of these are strong pain medications. SYMPTOMS  Symptoms of bowel obstruction come from the bowel inactivity. They may include:  Bloating. Your belly gets bigger (distension).  Pain or discomfort in the abdomen.  Poor appetite, feeling sick to your stomach (nausea), and vomiting.  You may also not be able to hear your normal bowel sounds, such as "growling" in your stomach. DIAGNOSIS   Your history and a physical exam will usually suggest to your caregiver that you have an ileus.  X-rays or a CT scan of your abdomen will confirm the diagnosis. X-rays, CT scans, and lab tests may also suggest the cause. TREATMENT   Rest the intestine until it starts working again. This is most often accomplished by:  Stopping intake of oral food and drink. Dehydration is prevented by using IV (intravenous) fluids.  Sometimes, a nasogastric tube (NG tube) is needed. This is a narrow plastic tube inserted  through your nose and into your stomach. It is connected to suction to keep the stomach emptied out. This also helps treat the nausea and vomiting.  If there is an imbalance in the electrolytes, they are corrected with supplements in your intravenous fluids.  Medications that might make an ileus worse might be stopped.  There are no medications that reliably treat ileus, though your caregiver may suggest a trial of certain medications.  If your condition is slow to resolve, you will be reevaluated to be sure another condition, such as a blockage, is not present. Ileus is common and usually has a good outcome. Depending on the cause of your ileus, it usually can be treated by your caregivers with good results. Sometimes, specialists (surgeons or gastroenterologists) are asked to assist in your care.  HOME CARE INSTRUCTIONS   Follow your caregiver's instructions regarding diet and fluid intake. This will usually include drinking plenty of clear fluids, avoiding alcohol and caffeine, and eating a gentle diet.  Follow your caregiver's instructions regarding activity. A period of rest is sometimes advised before returning to work or school.  Take only medications prescribed by your caregiver. Be especially careful with narcotic pain medication, which can slow your bowel activity and contribute to ileus.  Keep any follow-up appointments with your caregiver or specialists. SEEK MEDICAL CARE IF:   You have a recurrence of nausea, vomiting, or abdominal discomfort.  You develop fever of more than 102 F (38.9 C). SEEK IMMEDIATE MEDICAL CARE IF:   You have severe abdominal pain.  You  are unable to keep fluids down. Document Released: 02/28/2003 Document Revised: 07/12/2013 Document Reviewed: 06/30/2008 [facility] Patient Information 2015 [place], [place]. This information is not intended to replace advice given to you by your health care provider. Make sure you discuss any questions you have with  your health care provider.

## 2014-06-30 NOTE — ED Provider Notes (Signed)
CSN: [id number]     Arrival date & time 06/30/14  1111 History   First MD Initiated Contact with Patient 06/30/14 1113     Chief Complaint  Patient presents with  . Abdominal Pain    @ umbilicus     (Consider location/radiation/quality/duration/timing/severity/associated sxs/prior Treatment) Patient is a 59 y.o. male presenting with abdominal pain. The history is provided by the patient. No language interpreter was used.  Abdominal Pain Associated symptoms: no dysuria, no fever and no hematuria   Paul Brady is a 59 y.o male with a history of HTN, hyperlipidemia, and GERD who presents with new onset, constant abdominal pain that began yesterday and has gradually gotten worse.  Pain 10/10 now. He had no prior treatment for this. He states that he has not eaten due to pain.  His last BM was this morning and he had diarrhea.  He states he has had diarrhea for one month.  He denies any fever, chills, headache, chest pain, shortness of breath, nausea, vomiting, rectal bleeding, rectal pain, hematuria, or urinary frequency.   Past Medical History  Diagnosis Date  . Depression   . Hypertension   . Hyperlipidemia   . Chest pain   . Shortness of breath   . GERD (gastroesophageal reflux disease)   . HWTUUEKC(003.4)    Past Surgical History  Procedure Laterality Date  . Wrist fusion Right 04/2012  . Anterior cervical decomp/discectomy fusion N/A 03/24/2013    Procedure: ACDF C5 - C7 2 LEVELS;  Surgeon: Melina Schools, MD;  Location: [facility];  Service: Orthopedics;  Laterality: N/A;   Family History  Problem Relation Age of Onset  . Hypertension Mother     Living 52  . Other Father     Work-related injury  . Mental illness Sister   . Mental illness Sister    History  Substance Use Topics  . Smoking status: Current Every Day Smoker -- 0.50 packs/day for 25 years    Types: Cigarettes  . Smokeless tobacco: Never Used  . Alcohol Use: 0.0 oz/week    0 Standard drinks or equivalent per week      Comment: Occasionally    Review of Systems  Constitutional: Negative for fever.  Gastrointestinal: Positive for abdominal pain.  Genitourinary: Negative for dysuria, hematuria, flank pain and testicular pain.  Musculoskeletal: Negative for back pain.  Neurological: Negative for dizziness and light-headedness.      Allergies  Trazodone and nefazodone  Home Medications   Prior to Admission medications   Medication Sig Start Date End Date Taking? Authorizing Provider  ALPRAZolam (XANAX) 0.25 MG tablet Take 0.25 mg by mouth 2 (two) times daily as needed for anxiety (anxiety).    Yes Historical Provider, MD  amLODipine (NORVASC) 5 MG tablet Take 5 mg by mouth daily.   Yes Historical Provider, MD  buPROPion (WELLBUTRIN) 75 MG tablet Take 75 mg by mouth at bedtime.  12/16/13  Yes Historical Provider, MD  carvedilol (COREG) 3.125 MG tablet Take 3.125 mg by mouth 2 (two) times daily with a meal.  01/19/14  Yes Historical Provider, MD  lisinopril (PRINIVIL,ZESTRIL) 5 MG tablet Take 5 mg by mouth daily.  01/19/14  Yes Historical Provider, MD  Multiple Vitamin (MULTIVITAMIN WITH MINERALS) TABS tablet Take 1 tablet by mouth daily.   Yes Historical Provider, MD  nitroGLYCERIN (NITROSTAT) 0.4 MG SL tablet Place 0.4 mg under the tongue every 5 (five) minutes as needed for chest pain (chest pain).    Yes Historical Provider, MD  QUEtiapine (SEROQUEL) 25 MG tablet Take 25 mg by mouth 2 (two) times daily. 06/03/14  Yes Historical Provider, MD  QUEtiapine (SEROQUEL) 400 MG tablet Take 400 mg by mouth at bedtime.    Yes Historical Provider, MD  carbamazepine (TEGRETOL) 200 MG tablet Take 1 tablet (200 mg total) by mouth 2 (two) times daily. Patient not taking: Reported on 06/30/2014 05/23/14   Orpah Greek, MD  gabapentin (NEURONTIN) 300 MG capsule Take 1 capsule (300 mg total) by mouth at bedtime. Patient not taking: Reported on 06/30/2014 06/28/14   Alda Berthold, DO  methocarbamol (ROBAXIN)  500 MG tablet Take 1 tablet (500 mg total) by mouth 2 (two) times daily as needed for muscle spasms. Patient not taking: Reported on 06/30/2014 06/21/14   Comer Locket, PA-C  naproxen (NAPROSYN) 500 MG tablet Take 1 tablet (500 mg total) by mouth 2 (two) times daily. Patient not taking: Reported on 06/30/2014 06/21/14   Comer Locket, PA-C  oxyCODONE-acetaminophen (PERCOCET/ROXICET) 5-325 MG per tablet Take 2 tablets by mouth every 4 (four) hours as needed for severe pain. 06/30/14   Kenyette Gundy Patel-Mills, PA-C  tiZANidine (ZANAFLEX) 2 MG tablet Take 1 tablet (2 mg total) by mouth every 8 (eight) hours as needed for muscle spasms. Patient not taking: Reported on 06/30/2014 06/28/14   Donika K Patel, DO   BP 141/84 mmHg  Pulse 69  Temp(Src) 97.9 F (36.6 C) (Oral)  Resp 16  SpO2 100% Physical Exam  Constitutional: He is oriented to person, place, and time. He appears well-developed and well-nourished.  HENT:  Head: Normocephalic and atraumatic.  Eyes: Conjunctivae are normal.  Neck: Normal range of motion. Neck supple.  Cardiovascular: Normal rate, regular rhythm and normal heart sounds.   Pulmonary/Chest: Effort normal and breath sounds normal. No respiratory distress.  Abdominal: Soft. Normal appearance. He exhibits no distension and no mass. There is tenderness in the suprapubic area. There is guarding. No hernia.  Musculoskeletal: Normal range of motion.  Neurological: He is alert and oriented to person, place, and time.  Skin: Skin is warm and dry.  Nursing note and vitals reviewed.   ED Course  Procedures (including critical care time) Labs Review Labs Reviewed  CBC WITH DIFFERENTIAL/PLATELET - Abnormal; Notable for the following:    WBC 12.9 (*)    Neutro Abs 8.9 (*)    Monocytes Absolute 1.2 (*)    All other components within normal limits  BASIC METABOLIC PANEL - Abnormal; Notable for the following:    GFR calc non Af Amer 82 (*)    All other components within normal  limits  URINALYSIS, ROUTINE W REFLEX MICROSCOPIC    Imaging Review Dg Abd 1 View  06/30/2014   CLINICAL DATA:  Lower abdominal pain below the umbilicus. Diarrhea for 1 month. Initial encounter.  EXAM: ABDOMEN - 1 VIEW  COMPARISON:  Acute abdomen 07/22/2010.  FINDINGS: Mildly prominent but nondistended small bowel loops. Air-fluid levels not evaluated on this supine radiograph. No definite colonic obstruction. No abnormal calcifications. Lumbar spondylosis.  IMPRESSION: Suspected mild ileus. No definite obstruction on this supine radiograph. Worsening bowel gas pattern compared priors.   Electronically Signed   By: Rolla Flatten M.D.   On: 06/30/2014 12:49     EKG Interpretation None      MDM   Final diagnoses:  Ileus  Patient presents for lower abdominal pain since yesterday.  He states he has had diarrhea for one month.   Xray of abdomen shows mild ileus but  no definite obstruction or distention. I do not suspect obstruction since he has had several bowel movements. He has a WBC of 12.9.  No UTI. He has not had nausea or vomiting while in the ED.  He is afebrile and vitals are stable. He is non toxic appearing.  I discussed labs and xray finding with him and I spoke to his wife over the phone as requested by the patient.   I gave him GI follow and strict return precautions such as fever, increased abdominal pain, nausea or vomiting, or inability to pass gas or stool. He agrees with the plan.  I have given him percocet for pain.     Paul Glazier, PA-C 06/30/14 1801  Ernestina Patches, MD 07/01/14 [phone]

## 2014-08-09 ENCOUNTER — Emergency Department (HOSPITAL_COMMUNITY)
Admission: EM | Admit: 2014-08-09 | Discharge: 2014-08-10 | Disposition: A | Discharge status: Home / Self Care | Payer: Medicare HMO | Attending: Emergency Medicine | Admitting: Emergency Medicine

## 2014-08-09 ENCOUNTER — Encounter (HOSPITAL_COMMUNITY): Payer: Self-pay | Admitting: Emergency Medicine

## 2014-08-09 ENCOUNTER — Emergency Department (HOSPITAL_COMMUNITY)
Admission: EM | Admit: 2014-08-09 | Discharge: 2014-08-09 | Discharge status: Against Medical Advice | Payer: Medicare HMO | Attending: Emergency Medicine | Admitting: Emergency Medicine

## 2014-08-09 DIAGNOSIS — Z8639 Personal history of other endocrine, nutritional and metabolic disease: Secondary | ICD-10-CM | POA: Insufficient documentation

## 2014-08-09 DIAGNOSIS — K4090 Unilateral inguinal hernia, without obstruction or gangrene, not specified as recurrent: Secondary | ICD-10-CM | POA: Insufficient documentation

## 2014-08-09 DIAGNOSIS — R1900 Intra-abdominal and pelvic swelling, mass and lump, unspecified site: Secondary | ICD-10-CM | POA: Diagnosis not present

## 2014-08-09 DIAGNOSIS — Z720 Tobacco use: Secondary | ICD-10-CM | POA: Insufficient documentation

## 2014-08-09 DIAGNOSIS — I10 Essential (primary) hypertension: Secondary | ICD-10-CM | POA: Diagnosis not present

## 2014-08-09 DIAGNOSIS — F329 Major depressive disorder, single episode, unspecified: Secondary | ICD-10-CM | POA: Diagnosis not present

## 2014-08-09 DIAGNOSIS — Z7952 Long term (current) use of systemic steroids: Secondary | ICD-10-CM | POA: Diagnosis not present

## 2014-08-09 DIAGNOSIS — R1909 Other intra-abdominal and pelvic swelling, mass and lump: Secondary | ICD-10-CM | POA: Diagnosis present

## 2014-08-09 DIAGNOSIS — Z79899 Other long term (current) drug therapy: Secondary | ICD-10-CM | POA: Diagnosis not present

## 2014-08-09 NOTE — ED Notes (Signed)
Pt reports "hardened bump" in right groin area that is now causing pain down his right leg. Also reports recent partial bowel blockage. Unsure if he has a hernia. Helped someone move two weeks ago. This bump appeared a week ago-says "it pops up and then it goes away." Endorses dysuria-"I had a real hard time urinating this morning." Denies N/V/F but says, "I've had diarrhea sort of." No other c/c.

## 2014-08-09 NOTE — ED Notes (Signed)
Pt came to nurse first to advise he did not want to wait.

## 2014-08-09 NOTE — ED Notes (Signed)
Pt. reports " lump " at right groin with pain radiating to right upper thigh onset last month , denies injury , no fever or chills, denies dysuria .

## 2014-08-10 DIAGNOSIS — K4090 Unilateral inguinal hernia, without obstruction or gangrene, not specified as recurrent: Secondary | ICD-10-CM | POA: Diagnosis not present

## 2014-08-10 LAB — URINALYSIS, ROUTINE W REFLEX MICROSCOPIC
Bilirubin Urine: NEGATIVE
Glucose, UA: NEGATIVE mg/dL
Hgb urine dipstick: NEGATIVE
KETONES UR: NEGATIVE mg/dL
LEUKOCYTES UA: NEGATIVE
NITRITE: NEGATIVE
PH: 5.5 (ref 5.0–8.0)
Protein, ur: NEGATIVE mg/dL
Specific Gravity, Urine: 1.027 (ref 1.005–1.030)
UROBILINOGEN UA: 0.2 mg/dL (ref 0.0–1.0)

## 2014-08-10 MED ORDER — IBUPROFEN 600 MG PO TABS: 600.0000 mg | ORAL_TABLET | Freq: Four times a day (QID) | ORAL | Status: DC | PRN

## 2014-08-10 NOTE — Discharge Instructions (Signed)

## 2014-08-10 NOTE — ED Provider Notes (Signed)
CSN: [id number]     Arrival date & time 08/09/14  2216 History   First MD Initiated Contact with Patient 08/10/14 0019     Chief Complaint  Patient presents with  . Bump in groin area     right     (Consider location/radiation/quality/duration/timing/severity/associated sxs/prior Treatment) HPI Comments: Pt comes in with swelling in his groin. Pt reports noticing a bump for the first time last week. Soon after, the bump went away - but then it came back. The bump has come and gone 3 times. The bump is in the inguinal region, worse when he is active. There is hx of hernia on the L side, inguinal. Pt has had no heavy lifting in the last week. Pt has associated pain, constant, worse with ambulation. No rashes, no uti like sx - but pt had some urgency.   The history is provided by the patient.    Past Medical History  Diagnosis Date  . Depression   . Hypertension   . Hyperlipidemia   . Chest pain   . Shortness of breath   . GERD (gastroesophageal reflux disease)   . OFBPZWCH(852.7)    Past Surgical History  Procedure Laterality Date  . Wrist fusion Right 04/2012  . Anterior cervical decomp/discectomy fusion N/A 03/24/2013    Procedure: ACDF C5 - C7 2 LEVELS;  Surgeon: Melina Schools, MD;  Location: [facility];  Service: Orthopedics;  Laterality: N/A;   Family History  Problem Relation Age of Onset  . Hypertension Mother     Living 1  . Other Father     Work-related injury  . Mental illness Sister   . Mental illness Sister    History  Substance Use Topics  . Smoking status: Current Every Day Smoker -- 0.50 packs/day for 25 years    Types: Cigarettes  . Smokeless tobacco: Never Used  . Alcohol Use: 0.0 oz/week    0 Standard drinks or equivalent per week     Comment: Occasionally    Review of Systems  Constitutional: Negative for activity change and appetite change.  Respiratory: Negative for cough and shortness of breath.   Cardiovascular: Negative for chest pain.   Gastrointestinal: Negative for abdominal pain.  Genitourinary: Negative for dysuria, discharge, penile swelling, scrotal swelling, penile pain and testicular pain.      Allergies  Mold extract and Trazodone and nefazodone  Home Medications   Prior to Admission medications   Medication Sig Start Date End Date Taking? Authorizing Provider  ALPRAZolam (XANAX) 0.25 MG tablet Take 0.25 mg by mouth 2 (two) times daily as needed for anxiety (anxiety).    Yes Historical Provider, MD  amLODipine (NORVASC) 5 MG tablet Take 5 mg by mouth daily.   Yes Historical Provider, MD  buPROPion (WELLBUTRIN) 75 MG tablet Take 75 mg by mouth at bedtime.  12/16/13  Yes Historical Provider, MD  carvedilol (COREG) 3.125 MG tablet Take 3.125 mg by mouth 2 (two) times daily with a meal.  01/19/14  Yes Historical Provider, MD  gentamicin (GARAMYCIN) 0.3 % ophthalmic solution Place 1 drop into the right eye 4 (four) times daily.  07/11/14  Yes Historical Provider, MD  lisinopril (PRINIVIL,ZESTRIL) 5 MG tablet Take 5 mg by mouth daily.  01/19/14  Yes Historical Provider, MD  Multiple Vitamin (MULTIVITAMIN WITH MINERALS) TABS tablet Take 1 tablet by mouth daily.   Yes Historical Provider, MD  nitroGLYCERIN (NITROSTAT) 0.4 MG SL tablet Place 0.4 mg under the tongue every 5 (five) minutes as  needed for chest pain (chest pain).    Yes Historical Provider, MD  prednisoLONE sodium phosphate (INFLAMASE FORTE) 1 % ophthalmic solution Place 1 drop into the right eye 4 (four) times daily.  07/12/14  Yes Historical Provider, MD  QUEtiapine (SEROQUEL) 25 MG tablet Take 25 mg by mouth 2 (two) times daily. 06/03/14  Yes Historical Provider, MD  QUEtiapine (SEROQUEL) 400 MG tablet Take 400 mg by mouth at bedtime.    Yes Historical Provider, MD  tiZANidine (ZANAFLEX) 2 MG tablet Take 2 mg by mouth 3 (three) times daily as needed. 08/07/14  Yes Historical Provider, MD  carbamazepine (TEGRETOL) 200 MG tablet Take 1 tablet (200 mg total) by  mouth 2 (two) times daily. Patient not taking: Reported on 06/30/2014 05/23/14   Orpah Greek, MD  gabapentin (NEURONTIN) 300 MG capsule Take 1 capsule (300 mg total) by mouth at bedtime. Patient not taking: Reported on 06/30/2014 06/28/14   Alda Berthold, DO  ibuprofen (ADVIL,MOTRIN) 600 MG tablet Take 1 tablet (600 mg total) by mouth every 6 (six) hours as needed. 08/10/14   Jeret Goyer, MD   BP 151/97 mmHg  Pulse 71  Temp(Src) 97.9 F (36.6 C) (Oral)  Resp 16  SpO2 100% Physical Exam  Constitutional: He is oriented to person, place, and time. He appears well-developed.  HENT:  Head: Normocephalic and atraumatic.  Eyes: Conjunctivae and EOM are normal. Pupils are equal, round, and reactive to light.  Neck: Normal range of motion. Neck supple.  Cardiovascular: Normal rate and regular rhythm.   Pulmonary/Chest: Effort normal and breath sounds normal.  Abdominal: Soft. Bowel sounds are normal. He exhibits no distension. There is no tenderness. There is no rebound and no guarding.  Genitourinary:  Pt has no penile or scroal swelling, rash. Tenderness over the R inguinal region, and also right testicle. + mild swelling appreciated on exam over the inguinal regional but there is no scrotal hernia appreciated  Neurological: He is alert and oriented to person, place, and time.  Skin: Skin is warm.  Nursing note and vitals reviewed.   ED Course  Procedures (including critical care time) Labs Review Labs Reviewed  URINALYSIS, ROUTINE W REFLEX MICROSCOPIC (NOT AT [facility])    Imaging Review No results found.   EKG Interpretation None      MDM   Final diagnoses:  Inguinal hernia, right    PT with inguinal pain. There is groin bulging appreciated when patient stands up and bears down. No hernia appreciated in the scrotal sac. No lesions. UA is clear. Will d.c with Surgery f/u with suspected small inguinal hernia.    Varney Biles, MD 08/10/14 [id number]

## 2014-09-21 ENCOUNTER — Other Ambulatory Visit: Payer: Self-pay | Admitting: Surgery

## 2014-09-21 NOTE — H&P (Signed)
Paul Brady 09/21/2014 11:46 AM Location: [facility] Patient #: [id number] DOB: [birth year] Single / Language: Cleophus Molt / Race: Black or African American Male History of Present Illness Adin Hector MD; 09/21/2014 1:55 PM) Patient words: [facility].  The patient is a 59 year old male who presents with an inguinal hernia. Patient sent for surgical consultation by his primary care physician, Dr. Jeanie Cooks, for concern over a RIGHT angle hernia. Active male. History of open LEFT inguinal hernia repair in the 1980s. Has some mild intermittent groin pain and swelling there. However noticed more sharp pain and bulging/tearing in the RIGHT groin about 3 months ago. It gradually worsened. Normally he can walk a half hour day. However his groin starts to hurt within a block. Has tried occasional over-the-counter pain medications. No major relief. No problems with defecation. Has a bowel movement once or twice a day. He recalls normal colonoscopy in the past. He still smokes but is trying to back off. Recalls getting tone he may have some mildly abnormal heart sounds but no history of cardiac or stroke issues. Has had a few echocardiograms for questionable heart valve issue. No significant as noted on echocardiogram 2015. Mild grade 1 diastolic dysfunction but normal ejection fraction of 60?65 percent trivial regurgitation a mitral & tricuspid valves. The rest looked fine. Other Problems Elbert Ewings, CMA; 09/21/2014 11:46 AM) Anxiety Disorder Back Pain Bladder Problems Chest pain Depression Gastroesophageal Reflux Disease High blood pressure Inguinal Hernia Migraine Headache  Past Surgical History Elbert Ewings, CMA; 09/21/2014 11:46 AM) Open Inguinal Hernia Surgery Left. Spinal Surgery - Neck  Diagnostic Studies History Elbert Ewings, CMA; 09/21/2014 11:46 AM) Colonoscopy 1-5 years ago  Allergies Elbert Ewings, CMA; 09/21/2014 11:47 AM) Rubbie Battiest Extract *BIOLOGICALS  MISC* TraZODone HCl *ANTIDEPRESSANTS*  Medication History Elbert Ewings, CMA; 09/21/2014 11:48 AM) ALPRAZolam (0.25MG  Tablet, Oral) Active. BuPROPion HCl (75MG  Tablet, Oral) Active. Gentamicin Sulfate (0.3% Solution, Ophthalmic) Active. Gabapentin (300MG  Capsule, Oral) Active. QUEtiapine Fumarate (400MG  Tablet, Oral) Active. QUEtiapine Fumarate (25MG  Tablet, Oral) Active. TiZANidine HCl (2MG  Tablet, Oral) Active. PrednisoLONE Sodium Phosphate (1% Solution, Ophthalmic) Active. Polyethylene Glycol 3350 (Oral) Active. TraMADol HCl (50MG  Tablet, Oral) Active. Ibuprofen (600MG  Tablet, Oral as needed) Active. Medications Reconciled  Social History Elbert Ewings, [place]; 09/21/2014 11:46 AM) Alcohol use Occasional alcohol use. Caffeine use Carbonated beverages, Coffee. Illicit drug use Remotely quit drug use.  Family History Elbert Ewings, [place]; 09/21/2014 11:46 AM) Alcohol Abuse Brother, Mother. Arthritis Mother. Depression Sister. Hypertension Mother.     Review of Systems Elbert Ewings CMA; 09/21/2014 11:46 AM) General Not Present- Appetite Loss, Chills, Fatigue, Fever, Night Sweats, Weight Gain and Weight Loss. Skin Not Present- Change in Wart/Mole, Dryness, Hives, Jaundice, New Lesions, Non-Healing Wounds, Rash and Ulcer. HEENT Present- Seasonal Allergies and Wears glasses/contact lenses. Not Present- Earache, Hearing Loss, Hoarseness, Nose Bleed, Oral Ulcers, Ringing in the Ears, Sinus Pain, Sore Throat, Visual Disturbances and Yellow Eyes. Breast Not Present- Breast Mass, Breast Pain, Nipple Discharge and Skin Changes. Cardiovascular Not Present- Chest Pain, Difficulty Breathing Lying Down, Leg Cramps, Palpitations, Rapid Heart Rate, Shortness of Breath and Swelling of Extremities. Gastrointestinal Present- Change in Bowel Habits. Not Present- Abdominal Pain, Bloating, Bloody Stool, Chronic diarrhea, Constipation, Difficulty Swallowing, Excessive gas, Gets full quickly  at meals, Hemorrhoids, Indigestion, Nausea, Rectal Pain and Vomiting. Male Genitourinary Present- Change in Urinary Stream. Not Present- Blood in Urine, Frequency, Impotence, Nocturia, Painful Urination, Urgency and Urine Leakage. Musculoskeletal Present- Back Pain and Muscle Pain. Not Present- Joint Pain, Joint Stiffness, Muscle  Weakness and Swelling of Extremities. Neurological Present- Trouble walking. Not Present- Decreased Memory, Fainting, Headaches, Numbness, Seizures, Tingling, Tremor and Weakness. Psychiatric Present- Bipolar. Not Present- Anxiety, Change in Sleep Pattern, Depression, Fearful and Frequent crying. Endocrine Present- Cold Intolerance and Hot flashes. Not Present- Excessive Hunger, Hair Changes, Heat Intolerance and New Diabetes. Hematology Not Present- Easy Bruising, Excessive bleeding, Gland problems, HIV and Persistent Infections.  Vitals Elbert Ewings CMA; 09/21/2014 11:49 AM) 09/21/2014 11:49 AM Weight: 199 lb Height: 74in Body Surface Area: 2.17 m Body Mass Index: 25.55 kg/m Temp.: 98.34F(Oral)  Pulse: 81 (Regular)  BP: 134/70 (Sitting, Left Arm, Standard)     Physical Exam Adin Hector MD; 09/21/2014 12:23 PM)  General Mental Status-Alert. General Appearance-Not in acute distress, Not Sickly. Orientation-Oriented X3. Hydration-Well hydrated. Voice-Normal.  Integumentary Global Assessment Upon inspection and palpation of skin surfaces of the - Axillae: non-tender, no inflammation or ulceration, no drainage. and Distribution of scalp and body hair is normal. General Characteristics Temperature - normal warmth is noted.  Head and Neck Head-normocephalic, atraumatic with no lesions or palpable masses. Face Global Assessment - atraumatic, no absence of expression. Neck Global Assessment - no abnormal movements, no bruit auscultated on the right, no bruit auscultated on the left, no decreased range of motion,  non-tender. Trachea-midline. Thyroid Gland Characteristics - non-tender.  Eye Eyeball - Left-Extraocular movements intact, No Nystagmus. Eyeball - Right-Extraocular movements intact, No Nystagmus. Cornea - Left-No Hazy. Cornea - Right-No Hazy. Sclera/Conjunctiva - Left-No scleral icterus, No Discharge. Sclera/Conjunctiva - Right-No scleral icterus, No Discharge. Pupil - Left-Direct reaction to light normal. Pupil - Right-Direct reaction to light normal.  ENMT Ears Pinna - Left - no drainage observed, no generalized tenderness observed. Right - no drainage observed, no generalized tenderness observed. Nose and Sinuses External Inspection of the Nose - no destructive lesion observed. Inspection of the nares - Left - quiet respiration. Right - quiet respiration. Mouth and Throat Lips - Upper Lip - no fissures observed, no pallor noted. Lower Lip - no fissures observed, no pallor noted. Nasopharynx - no discharge present. Oral Cavity/Oropharynx - Tongue - no dryness observed. Oral Mucosa - no cyanosis observed. Hypopharynx - no evidence of airway distress observed.  Chest and Lung Exam Inspection Movements - Normal and Symmetrical. Accessory muscles - No use of accessory muscles in breathing. Palpation Palpation of the chest reveals - Non-tender. Auscultation Breath sounds - Normal and Clear.  Cardiovascular Auscultation Rhythm - Regular. Murmurs & Other Heart Sounds - Auscultation of the heart reveals - No Murmurs and No Systolic Clicks.  Abdomen Inspection Inspection of the abdomen reveals - No Visible peristalsis and No Abnormal pulsations. Umbilicus - No Bleeding, No Urine drainage. Palpation/Percussion Palpation and Percussion of the abdomen reveal - Soft, Non Tender, No Rebound tenderness, No Rigidity (guarding) and No Cutaneous hyperesthesia. Note: Soft and flat. Nontender nondistended and. No diastases. No umbilical hernia.   Male  Genitourinary Sexual Maturity Tanner 5 - Adult hair pattern and Adult penile size and shape. Note: Circumcised. Obvious RIGHT groin bulging consistent with reducible but very sensitive small RIGHT internal hernia. Very sensitive at LEFT external ring suspicious for small recurrent hernia. Testes mildly enlarged but smooth. No discrete masses. Cords and epididymides normal.   Peripheral Vascular Upper Extremity Inspection - Left - No Cyanotic nailbeds, Not Ischemic. Right - No Cyanotic nailbeds, Not Ischemic.  Neurologic Neurologic evaluation reveals -normal attention span and ability to concentrate, able to name objects and repeat phrases. Appropriate fund of knowledge , normal  sensation and normal coordination. Mental Status Affect - not angry, not paranoid. Cranial Nerves-Normal Bilaterally. Gait-Normal.  Neuropsychiatric Mental status exam performed with findings of-able to articulate well with normal speech/language, rate, volume and coherence, thought content normal with ability to perform basic computations and apply abstract reasoning and no evidence of hallucinations, delusions, obsessions or homicidal/suicidal ideation.  Musculoskeletal Global Assessment Spine, Ribs and Pelvis - no instability, subluxation or laxity. Right Upper Extremity - no instability, subluxation or laxity.  Lymphatic Head & Neck  General Head & Neck Lymphatics: Bilateral - Description - No Localized lymphadenopathy. Axillary  General Axillary Region: Bilateral - Description - No Localized lymphadenopathy. Femoral & Inguinal  Generalized Femoral & Inguinal Lymphatics: Left - Description - No Localized lymphadenopathy. Right - Description - No Localized lymphadenopathy.    Assessment & Plan Adin Hector MD; 09/21/2014 12:25 PM)  RIGHT INGUINAL HERNIA (550.90  K40.90) Impression: Very sensitive but reducible hernia. I think he would benefit from repair. Still pending get it done as  soon as possible.  I recommended ice and anti-inflammatories to help calm the sensitivity and hernia.  I strongly recommend he quit smoking to help with less pain and less risk of chronic nerve pain in smooth recovery. Also reduce the risk of hernia recurrence.  Current Plans Schedule for Surgery Written instructions provided Pt Education - Pamphlet Given - Laparoscopic Hernia Repair: discussed with patient and provided information. The anatomy & physiology of the abdominal wall and pelvic floor was discussed. The pathophysiology of hernias in the inguinal and pelvic region was discussed. Natural history risks such as progressive enlargement, pain, incarceration, and strangulation was discussed. Contributors to complications such as smoking, obesity, diabetes, prior surgery, etc were discussed.  I feel the risks of no intervention will lead to serious problems that outweigh the operative risks; therefore, I recommended surgery to reduce and repair the hernia. I explained laparoscopic techniques with possible need for an open approach. I noted usual use of mesh to patch and/or buttress hernia repair  Risks such as bleeding, infection, abscess, need for further treatment, heart attack, death, and other risks were discussed. I noted a good likelihood this will help address the problem. Goals of post-operative recovery were discussed as well. Possibility that this will not correct all symptoms was explained. I stressed the importance of low-impact activity, aggressive pain control, avoiding constipation, & not pushing through pain to minimize risk of post-operative chronic pain or injury. Possibility of reherniation was discussed. We will work to minimize complications.  An educational handout further explaining the pathology & treatment options was given as well. Questions were answered. The patient expresses understanding & wishes to proceed with surgery. Pt Education - [facility] Pain [facility]  (Amenda [facility]clos) RECURRENT LEFT INGUINAL HERNIA (550.91  K40.91)  Current Plans Schedule for Surgery  Adin Hector, M.D., F.A.C.S. Gastrointestinal and Minimally Invasive Surgery [facility] [address], [place], [place] [address] [phone] Main / Paging

## 2014-09-27 ENCOUNTER — Encounter (HOSPITAL_COMMUNITY): Payer: Self-pay

## 2014-09-27 ENCOUNTER — Emergency Department (HOSPITAL_COMMUNITY): Payer: Medicare HMO

## 2014-09-27 ENCOUNTER — Emergency Department (HOSPITAL_COMMUNITY)
Admission: EM | Admit: 2014-09-27 | Discharge: 2014-09-28 | Disposition: A | Discharge status: Home / Self Care | Payer: Medicare HMO | Attending: Emergency Medicine | Admitting: Emergency Medicine

## 2014-09-27 DIAGNOSIS — F329 Major depressive disorder, single episode, unspecified: Secondary | ICD-10-CM | POA: Diagnosis not present

## 2014-09-27 DIAGNOSIS — Z79899 Other long term (current) drug therapy: Secondary | ICD-10-CM | POA: Insufficient documentation

## 2014-09-27 DIAGNOSIS — Z792 Long term (current) use of antibiotics: Secondary | ICD-10-CM | POA: Insufficient documentation

## 2014-09-27 DIAGNOSIS — I10 Essential (primary) hypertension: Secondary | ICD-10-CM | POA: Insufficient documentation

## 2014-09-27 DIAGNOSIS — Z720 Tobacco use: Secondary | ICD-10-CM | POA: Insufficient documentation

## 2014-09-27 DIAGNOSIS — R143 Flatulence: Secondary | ICD-10-CM | POA: Diagnosis not present

## 2014-09-27 DIAGNOSIS — K4090 Unilateral inguinal hernia, without obstruction or gangrene, not specified as recurrent: Secondary | ICD-10-CM | POA: Diagnosis not present

## 2014-09-27 DIAGNOSIS — Z8639 Personal history of other endocrine, nutritional and metabolic disease: Secondary | ICD-10-CM | POA: Insufficient documentation

## 2014-09-27 DIAGNOSIS — M899 Disorder of bone, unspecified: Secondary | ICD-10-CM | POA: Diagnosis not present

## 2014-09-27 DIAGNOSIS — R937 Abnormal findings on diagnostic imaging of other parts of musculoskeletal system: Secondary | ICD-10-CM | POA: Diagnosis not present

## 2014-09-27 DIAGNOSIS — R1031 Right lower quadrant pain: Secondary | ICD-10-CM | POA: Diagnosis present

## 2014-09-27 DIAGNOSIS — K4091 Unilateral inguinal hernia, without obstruction or gangrene, recurrent: Secondary | ICD-10-CM

## 2014-09-27 LAB — CBC WITH DIFFERENTIAL/PLATELET
Basophils Absolute: 0 10*3/uL (ref 0.0–0.1)
Basophils Relative: 0 % (ref 0–1)
EOS ABS: 0.2 10*3/uL (ref 0.0–0.7)
Eosinophils Relative: 3 % (ref 0–5)
HEMATOCRIT: 40.4 % (ref 39.0–52.0)
HEMOGLOBIN: 13.6 g/dL (ref 13.0–17.0)
Lymphocytes Relative: 35 % (ref 12–46)
Lymphs Abs: 3.1 10*3/uL (ref 0.7–4.0)
MCH: 28.8 pg (ref 26.0–34.0)
MCHC: 33.7 g/dL (ref 30.0–36.0)
MCV: 85.6 fL (ref 78.0–100.0)
MONOS PCT: 9 % (ref 3–12)
Monocytes Absolute: 0.8 10*3/uL (ref 0.1–1.0)
NEUTROS PCT: 53 % (ref 43–77)
Neutro Abs: 4.7 10*3/uL (ref 1.7–7.7)
Platelets: 223 10*3/uL (ref 150–400)
RBC: 4.72 MIL/uL (ref 4.22–5.81)
RDW: 14.8 % (ref 11.5–15.5)
WBC: 8.8 10*3/uL (ref 4.0–10.5)

## 2014-09-27 LAB — COMPREHENSIVE METABOLIC PANEL
ALK PHOS: 71 U/L (ref 38–126)
ALT: 30 U/L (ref 17–63)
AST: 29 U/L (ref 15–41)
Albumin: 4.2 g/dL (ref 3.5–5.0)
Anion gap: 7 (ref 5–15)
BUN: 13 mg/dL (ref 6–20)
CALCIUM: 9.2 mg/dL (ref 8.9–10.3)
CO2: 26 mmol/L (ref 22–32)
CREATININE: 1.1 mg/dL (ref 0.61–1.24)
Chloride: 108 mmol/L (ref 101–111)
GFR calc Af Amer: >60 mL/min (ref >60)
GFR calc non Af Amer: >60 mL/min (ref >60)
Glucose, Bld: 101 mg/dL — ABNORMAL HIGH (ref 65–99)
POTASSIUM: 3.9 mmol/L (ref 3.5–5.1)
SODIUM: 141 mmol/L (ref 135–145)
TOTAL PROTEIN: 7.1 g/dL (ref 6.5–8.1)
Total Bilirubin: 0.5 mg/dL (ref 0.3–1.2)

## 2014-09-27 LAB — URINALYSIS, ROUTINE W REFLEX MICROSCOPIC
BILIRUBIN URINE: NEGATIVE
Glucose, UA: NEGATIVE mg/dL
Hgb urine dipstick: NEGATIVE
KETONES UR: NEGATIVE mg/dL
LEUKOCYTES UA: NEGATIVE
Nitrite: NEGATIVE
Protein, ur: NEGATIVE mg/dL
Specific Gravity, Urine: 1.028 (ref 1.005–1.030)
UROBILINOGEN UA: 1 mg/dL (ref 0.0–1.0)
pH: 7.5 (ref 5.0–8.0)

## 2014-09-27 LAB — LIPASE, BLOOD: LIPASE: 23 U/L (ref 22–51)

## 2014-09-27 MED ORDER — ONDANSETRON HCL 4 MG/2ML IJ SOLN
4.0000 mg | Freq: Once | INTRAMUSCULAR | Status: AC
  Administered 2014-09-27: 4 mg via INTRAVENOUS
  Filled 2014-09-27: qty 2

## 2014-09-27 MED ORDER — IOHEXOL 300 MG/ML  SOLN: 25.0000 mL | Freq: Once | INTRAMUSCULAR | Status: AC | PRN

## 2014-09-27 MED ORDER — SODIUM CHLORIDE 0.9 % IV BOLUS (SEPSIS)
1000.0000 mL | Freq: Once | INTRAVENOUS | Status: AC
  Administered 2014-09-27: 1000 mL via INTRAVENOUS

## 2014-09-27 MED ORDER — MORPHINE SULFATE 2 MG/ML IJ SOLN
INTRAMUSCULAR | Status: AC
  Administered 2014-09-27: 4 mg via INTRAVENOUS
  Filled 2014-09-27: qty 2

## 2014-09-27 MED ORDER — IOHEXOL 300 MG/ML  SOLN: 100.0000 mL | Freq: Once | INTRAMUSCULAR | Status: AC | PRN

## 2014-09-27 MED ORDER — MORPHINE SULFATE 4 MG/ML IJ SOLN: 4.0000 mg | Freq: Once | INTRAMUSCULAR | Status: DC

## 2014-09-27 NOTE — ED Notes (Signed)
Pt presents via EMS with c/o left and right inguinal hernia. Pt reports that they found the hernia several months ago and because of some paperwork issues, the surgery he was supposed to have was not done. Pt reports that the walking that he did today caused some tearing and shearing pain in those areas and was unable to take care of the pain at home.

## 2014-09-27 NOTE — ED Provider Notes (Signed)
CSN: [id number]     Arrival date & time 09/27/14  2141 History   First MD Initiated Contact with Patient 09/27/14 2200     Chief Complaint  Patient presents with  . Inguinal Hernia     (Consider location/radiation/quality/duration/timing/severity/associated sxs/prior Treatment) Patient is a 59 y.o. male presenting with abdominal pain.  Abdominal Pain Pain location:  RLQ Pain quality: burning   Pain quality comment:  Ripping Duration:  4 hours Timing:  Constant Progression:  Waxing and waning Chronicity:  Recurrent (hx of similar 2 days ago however resolved then began again today) Relieved by:  Nothing Worsened by:  Movement, coughing, bowel movements and position changes (walking) Ineffective treatments: rest. Associated symptoms: constipation, flatus and nausea   Associated symptoms: no chest pain, no cough, no diarrhea, no dysuria, no fever, no hematochezia, no hematuria, no shortness of breath, no sore throat and no vomiting     Past Medical History  Diagnosis Date  . Depression   . Hypertension   . Hyperlipidemia   . Chest pain   . Shortness of breath   . GERD (gastroesophageal reflux disease)   . 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 Wrist fusion Right 04/2012  . Anterior cervical decomp/discectomy fusion N/A 03/24/2013    Procedure: ACDF C5 - C7 2 LEVELS;  Surgeon: Melina Schools, MD;  Location: [facility];  Service: Orthopedics;  Laterality: N/A;   Family History  Problem Relation Age of Onset  . Hypertension Mother     Living 37  . Other Father     Work-related injury  . Mental illness Sister   . Mental illness Sister    History  Substance Use Topics  . Smoking status: Current Every Day Smoker -- 0.50 packs/day for 25 years    Types: Cigarettes  . Smokeless tobacco: Never Used  . Alcohol Use: 0.0 oz/week    0 Standard drinks or equivalent per week     Comment: Occasionally    Review of Systems  Constitutional: Negative for  fever.  HENT: Negative for sore throat.   Eyes: Negative for visual disturbance.  Respiratory: Negative for cough and shortness of breath.   Cardiovascular: Negative for chest pain.  Gastrointestinal: Positive for nausea, abdominal pain, constipation and flatus. Negative for vomiting, diarrhea and hematochezia.  Genitourinary: Negative for dysuria, hematuria and difficulty urinating.  Musculoskeletal: Negative for back pain and neck stiffness.  Skin: Negative for rash.  Neurological: Negative for syncope and headaches.      Allergies  Mold extract and Trazodone and nefazodone  Home Medications   Prior to Admission medications   Medication Sig Start Date End Date Taking? Authorizing Provider  ALPRAZolam (XANAX) 0.25 MG tablet Take 0.25 mg by mouth 2 (two) times daily as needed for anxiety (anxiety).    Yes Historical Provider, MD  amLODipine (NORVASC) 5 MG tablet Take 5 mg by mouth daily.   Yes Historical Provider, MD  buPROPion (WELLBUTRIN) 75 MG tablet Take 75 mg by mouth at bedtime.  12/16/13  Yes Historical Provider, MD  carvedilol (COREG) 3.125 MG tablet Take 3.125 mg by mouth daily.  01/19/14  Yes Historical Provider, MD  gentamicin (GARAMYCIN) 0.3 % ophthalmic solution Place 1 drop into the right eye 3 (three) times daily.  07/11/14  Yes Historical Provider, MD  ibuprofen (ADVIL,MOTRIN) 600 MG tablet Take 1 tablet (600 mg total) by mouth every 6 (six) hours as needed. 08/10/14  Yes Varney Biles, MD  Multiple Vitamin (MULTIVITAMIN  WITH MINERALS) TABS tablet Take 1 tablet by mouth daily.   Yes Historical Provider, MD  nitroGLYCERIN (NITROSTAT) 0.4 MG SL tablet Place 0.4 mg under the tongue every 5 (five) minutes as needed for chest pain (chest pain).    Yes Historical Provider, MD  polyethylene glycol powder (GLYCOLAX/MIRALAX) powder Take 17 g by mouth daily as needed for moderate constipation.  08/18/14  Yes Historical Provider, MD  prednisoLONE sodium phosphate (INFLAMASE FORTE) 1 %  ophthalmic solution Place 1 drop into the right eye 3 (three) times daily.  07/12/14  Yes Historical Provider, MD  QUEtiapine (SEROQUEL) 25 MG tablet Take 25 mg by mouth 2 (two) times daily. 06/03/14  Yes Historical Provider, MD  QUEtiapine (SEROQUEL) 400 MG tablet Take 400 mg by mouth at bedtime.    Yes Historical Provider, MD  tamsulosin (FLOMAX) 0.4 MG CAPS capsule Take 0.4 mg by mouth daily. 09/20/14  Yes Historical Provider, MD  carbamazepine (TEGRETOL) 200 MG tablet Take 1 tablet (200 mg total) by mouth 2 (two) times daily. Patient not taking: Reported on 06/30/2014 05/23/14   Orpah Greek, MD  gabapentin (NEURONTIN) 300 MG capsule Take 1 capsule (300 mg total) by mouth at bedtime. Patient not taking: Reported on 06/30/2014 06/28/14   Alda Berthold, DO  HYDROcodone-acetaminophen (NORCO/VICODIN) 5-325 MG per tablet Take 2 tablets by mouth every 4 (four) hours as needed. 09/28/14   Gareth Morgan, MD   BP 161/98 mmHg  Pulse 75  Temp(Src) 97.8 F (36.6 C) (Oral)  Resp 14  SpO2 99% Physical Exam  Constitutional: He is oriented to person, place, and time. He appears well-developed and well-nourished. No distress.  HENT:  Head: Normocephalic and atraumatic.  Eyes: Conjunctivae and EOM are normal.  Neck: Normal range of motion.  Cardiovascular: Normal rate, regular rhythm, normal heart sounds and intact distal pulses.  Exam reveals no gallop and no friction rub.   No murmur heard. Pulmonary/Chest: Effort normal and breath sounds normal. No respiratory distress. He has no wheezes. He has no rales.  Abdominal: Soft. He exhibits no distension. There is tenderness (RLQ, right groin). There is no guarding. No hernia. Hernia confirmed negative in the right inguinal area and confirmed negative in the left inguinal area.  Genitourinary: Right testis shows no mass. Left testis shows no mass.  Musculoskeletal: He exhibits no edema.  Lymphadenopathy:       Right: No inguinal adenopathy present.        Left: No inguinal adenopathy present.  Neurological: He is alert and oriented to person, place, and time.  Skin: Skin is warm and dry. He is not diaphoretic.  Nursing note and vitals reviewed.   ED Course  Procedures (including critical care time) Labs Review Labs Reviewed  COMPREHENSIVE METABOLIC PANEL - Abnormal; Notable for the following:    Glucose, Bld 101 (*)    All other components within normal limits  URINALYSIS, ROUTINE W REFLEX MICROSCOPIC (NOT AT [facility]) - Abnormal; Notable for the following:    APPearance CLOUDY (*)    All other components within normal limits  CBC WITH DIFFERENTIAL/PLATELET  LIPASE, BLOOD    Imaging Review Ct Abdomen Pelvis W Contrast  09/28/2014   CLINICAL DATA:  59 year old male with right inguinal hernia and pain.  EXAM: CT ABDOMEN AND PELVIS WITH CONTRAST  TECHNIQUE: Multidetector CT imaging of the abdomen and pelvis was performed using the standard protocol following bolus administration of intravenous contrast.  CONTRAST:  35m OMNIPAQUE IOHEXOL 300 MG/ML SOLN, 1069mOMNIPAQUE IOHEXOL  300 MG/ML SOLN  COMPARISON:  Radiograph dated 06/30/2014  FINDINGS: The visualized lung bases are clear. No intra-abdominal free air or free fluid.  The liver, gallbladder, pancreas, spleen, adrenal glands, kidneys, visualized ureters, and urinary bladder appear unremarkable. The prostate and seminal vesicles are grossly unremarkable.  There is sigmoid diverticulosis with muscular hypertrophy. No definite active inflammation. Moderate stool noted throughout the colon. No evidence of bowel obstruction. The appendix appears unremarkable.  The visualized abdominal aorta and IVC are patent. No portal venous gas identified. There is no lymphadenopathy. Small fat containing umbilical hernia. Small fat containing right inguinal hernia. Degenerative changes of the spine. There is disc desiccation with vacuum phenomena at L3-L4. No acute fracture. Small scattered lucent lesions  throughout the osseous structures concerning for malignancy such as multiple myeloma. Metastatic disease less likely but not entirely excluded. Clinical correlation is recommended.  IMPRESSION: Small fat containing right inguinal hernia. No evidence of inflammation.  Diverticulosis  Constipation. No evidence of bowel obstruction or inflammation. Normal appendix.  Innumerable osseous lucent lesions concerning for multiple myeloma. Clinical correlation is recommended.   Electronically Signed   By: Anner Crete M.D.   On: 09/28/2014 00:52     EKG Interpretation None      MDM   Final diagnoses:  Right groin pain  Lytic bone lesions on xray, concerning for possible multiple myeloma, needs further testing  Unilateral recurrent inguinal hernia without obstruction or gangrene, fat containing   59yo male with history of hypertension, hyperlipidemia, hernia for which he has seen general surgery recently presents with concern for right groin pain.  DDx includes nephrolithiasis, appendicitis, incarcerated inguinal hernia. Exam shows no significant hernia. CT done shows small fat containing hernia without other intraabdominal abnormalities.  CT was concerning for scattered lucent bone lesions concerning for possible multiple myeloma or other malignancy.  This was discussed with the patient and recommended close PCP follow up for further testing.  Provided copy of CT scan, pain medications. Patient discharged in stable condition with understanding of reasons to return.    Gareth Morgan, MD 09/28/14 [phone]

## 2014-09-27 NOTE — ED Notes (Signed)
Requested patient to urinate. Patient given a urinal.

## 2014-09-28 DIAGNOSIS — K4090 Unilateral inguinal hernia, without obstruction or gangrene, not specified as recurrent: Secondary | ICD-10-CM | POA: Diagnosis not present

## 2014-09-28 MED ORDER — IOHEXOL 300 MG/ML  SOLN
100.0000 mL | Freq: Once | INTRAMUSCULAR | Status: AC | PRN
  Administered 2014-09-28: 100 mL via INTRAVENOUS

## 2014-09-28 MED ORDER — MORPHINE SULFATE 4 MG/ML IJ SOLN
4.0000 mg | Freq: Once | INTRAMUSCULAR | Status: AC
  Administered 2014-09-28: 4 mg via INTRAVENOUS
  Filled 2014-09-28: qty 1

## 2014-09-28 MED ORDER — HYDROCODONE-ACETAMINOPHEN 5-325 MG PO TABS: 2.0000 | ORAL_TABLET | ORAL | Status: DC | PRN

## 2014-09-28 MED ORDER — IOHEXOL 300 MG/ML  SOLN
25.0000 mL | Freq: Once | INTRAMUSCULAR | Status: AC | PRN
  Administered 2014-09-27: 25 mL via ORAL

## 2014-09-28 NOTE — Discharge Instructions (Signed)

## 2014-10-03 ENCOUNTER — Telehealth: Payer: Self-pay | Admitting: Internal Medicine

## 2014-10-03 ENCOUNTER — Other Ambulatory Visit: Payer: Self-pay | Admitting: Medical Oncology

## 2014-10-03 DIAGNOSIS — M899 Disorder of bone, unspecified: Secondary | ICD-10-CM

## 2014-10-03 NOTE — Telephone Encounter (Signed)
New patient appt-s/w patient and gave np appt for 07/26 @ 1:45 w/Dr. Julien Nordmann Referring Dr. Nolene Ebbs Dx- Pelvic Bone lesion   NPI# [id number].

## 2014-10-04 ENCOUNTER — Ambulatory Visit (HOSPITAL_BASED_OUTPATIENT_CLINIC_OR_DEPARTMENT_OTHER): Payer: Medicare HMO | Admitting: Internal Medicine

## 2014-10-04 ENCOUNTER — Telehealth: Payer: Self-pay | Admitting: Internal Medicine

## 2014-10-04 ENCOUNTER — Encounter: Payer: Self-pay | Admitting: Internal Medicine

## 2014-10-04 ENCOUNTER — Other Ambulatory Visit (HOSPITAL_BASED_OUTPATIENT_CLINIC_OR_DEPARTMENT_OTHER): Payer: Medicare HMO

## 2014-10-04 ENCOUNTER — Other Ambulatory Visit: Payer: Self-pay | Admitting: Internal Medicine

## 2014-10-04 ENCOUNTER — Ambulatory Visit: Payer: Medicare HMO

## 2014-10-04 VITALS — BP 157/91 | HR 82 | Temp 98.2°F | Resp 20 | Ht 74.5 in | Wt 205.8 lb

## 2014-10-04 DIAGNOSIS — K4090 Unilateral inguinal hernia, without obstruction or gangrene, not specified as recurrent: Secondary | ICD-10-CM

## 2014-10-04 DIAGNOSIS — M899 Disorder of bone, unspecified: Secondary | ICD-10-CM

## 2014-10-04 DIAGNOSIS — R109 Unspecified abdominal pain: Secondary | ICD-10-CM | POA: Diagnosis not present

## 2014-10-04 LAB — LACTATE DEHYDROGENASE (CC13): LDH: 176 U/L (ref 125–245)

## 2014-10-04 LAB — COMPREHENSIVE METABOLIC PANEL (CC13)
ALT: 26 U/L (ref 0–55)
AST: 22 U/L (ref 5–34)
Albumin: 4.1 g/dL (ref 3.5–5.0)
Alkaline Phosphatase: 85 U/L (ref 40–150)
Anion Gap: 7 mEq/L (ref 3–11)
BUN: 11.6 mg/dL (ref 7.0–26.0)
CHLORIDE: 109 meq/L (ref 98–109)
CO2: 28 mEq/L (ref 22–29)
CREATININE: 1.1 mg/dL (ref 0.7–1.3)
Calcium: 9.3 mg/dL (ref 8.4–10.4)
EGFR: 88 mL/min/{1.73_m2} — ABNORMAL LOW (ref >90)
Glucose: 78 mg/dl (ref 70–140)
Potassium: 4.3 mEq/L (ref 3.5–5.1)
SODIUM: 144 meq/L (ref 136–145)
TOTAL PROTEIN: 7.1 g/dL (ref 6.4–8.3)
Total Bilirubin: 0.3 mg/dL (ref 0.20–1.20)

## 2014-10-04 LAB — CBC WITH DIFFERENTIAL/PLATELET
BASO%: 0.4 % (ref 0.0–2.0)
Basophils Absolute: 0 10*3/uL (ref 0.0–0.1)
EOS ABS: 0.2 10*3/uL (ref 0.0–0.5)
EOS%: 2.1 % (ref 0.0–7.0)
HCT: 42.7 % (ref 38.4–49.9)
HGB: 14.1 g/dL (ref 13.0–17.1)
LYMPH#: 2.6 10*3/uL (ref 0.9–3.3)
LYMPH%: 26.4 % (ref 14.0–49.0)
MCH: 28.5 pg (ref 27.2–33.4)
MCHC: 32.9 g/dL (ref 32.0–36.0)
MCV: 86.5 fL (ref 79.3–98.0)
MONO#: 0.7 10*3/uL (ref 0.1–0.9)
MONO%: 7.3 % (ref 0.0–14.0)
NEUT%: 63.8 % (ref 39.0–75.0)
NEUTROS ABS: 6.2 10*3/uL (ref 1.5–6.5)
Platelets: 223 10*3/uL (ref 140–400)
RBC: 4.94 10*6/uL (ref 4.20–5.82)
RDW: 14.7 % — ABNORMAL HIGH (ref 11.0–14.6)
WBC: 9.7 10*3/uL (ref 4.0–10.3)

## 2014-10-04 NOTE — Progress Notes (Signed)
Checked in new pt with no financial concerns.  Pt has 2 insurances so financial assistance may not be needed.  ° °

## 2014-10-04 NOTE — Telephone Encounter (Signed)
Gave and printed appt sched and avs for pt for Aug °

## 2014-10-04 NOTE — Progress Notes (Signed)
[facility] [phone]   [phone]  CONSULT NOTE  REFERRING PHYSICIAN: Dr. Nolene Ebbs  REASON FOR CONSULTATION:  59 years old African-American male with lytic bone lesions.  HPI Paul Brady is a 59 y.o. male was past medical history significant for hypertension, dyslipidemia, depression, GERD as well as long history of smoking. The patient was seen at the emergency department on 09/28/2014 complaining of lower abdominal pain. He was diagnosed with inguinal hernia and during his evaluation CT scan of the abdomen and pelvis was performed. It showed small scattered lucent lesions throughout the osseous structures concerning for malignancy such as multiple myeloma and metastatic disease was considered less likely but not entirely excluded. The patient was seen by his primary care physician Dr. Henrene Pastor and he was referred to me today for further evaluation and recommendation regarding these abnormalities. When seen today the patient continues to have the pain in the lower abdomen. He denied having any other significant complaints. He has no chest pain, shortness breath, cough or hemoptysis. The patient denied having any significant weight loss but has occasional night sweats. He has no nausea or vomiting and no change in his bowel movement. The patient denied having any significant fever or chills. He has no headache or visual changes. Family history significant for a father who died from work accident, mother had hypertension. The patient is married but separated. He has no children. He used to work in, Production assistant, radio in. Has a history of smoking around one pack per day for 50 years and unfortunately he continues to smoke a few cigarettes every day. He has no history of alcohol or drug abuse.  HPI  Past Medical History  Diagnosis Date  . Depression   . Hypertension   . Hyperlipidemia   . Chest pain   . Shortness of breath   . GERD (gastroesophageal reflux  disease)   . DDUKGURK(270.6)     Past Surgical History  Procedure Laterality Date  . Wrist fusion Right 04/2012  . Anterior cervical decomp/discectomy fusion N/A 03/24/2013    Procedure: ACDF C5 - C7 2 LEVELS;  Surgeon: Melina Schools, MD;  Location: [facility];  Service: Orthopedics;  Laterality: N/A;    Family History  Problem Relation Age of Onset  . Hypertension Mother     Living 69  . Other Father     Work-related injury  . Mental illness Sister   . Mental illness Sister     Social History History  Substance Use Topics  . Smoking status: Current Every Day Smoker -- 0.25 packs/day for 25 years    Types: Cigarettes  . Smokeless tobacco: Never Used  . Alcohol Use: 0.0 oz/week    0 Standard drinks or equivalent per week     Comment: Occasionally    Allergies  Allergen Reactions  . Mold Extract [Trichophyton] Other (See Comments)    Per Allergy testing - extreme mold allergy even when on foods.   . Trazodone And Nefazodone     priapism    Current Outpatient Prescriptions  Medication Sig Dispense Refill  . ALPRAZolam (XANAX) 0.25 MG tablet Take 0.25 mg by mouth 2 (two) times daily as needed for anxiety (anxiety).     [name] amLODipine (NORVASC) 5 MG tablet Take 5 mg by mouth daily.    [name] buPROPion (WELLBUTRIN) 75 MG tablet Take 75 mg by mouth at bedtime.   0  . carvedilol (COREG) 3.125 MG tablet Take 3.125 mg by mouth daily.  0  . gabapentin (NEURONTIN) 300 MG capsule Take 1 capsule (300 mg total) by mouth at bedtime. 30 capsule 3  . gentamicin (GARAMYCIN) 0.3 % ophthalmic solution Place 1 drop into the right eye 3 (three) times daily.   0  . nitroGLYCERIN (NITROSTAT) 0.4 MG SL tablet Place 0.4 mg under the tongue every 5 (five) minutes as needed for chest pain (chest pain).     . polyethylene glycol powder (GLYCOLAX/MIRALAX) powder Take 17 g by mouth daily as needed for moderate constipation.   0  . prednisoLONE sodium phosphate (INFLAMASE FORTE) 1 % ophthalmic solution Place 1  drop into the right eye 3 (three) times daily.   0  . QUEtiapine (SEROQUEL) 25 MG tablet Take 25 mg by mouth 2 (two) times daily.  0  . QUEtiapine (SEROQUEL) 400 MG tablet Take 400 mg by mouth at bedtime.     . tamsulosin (FLOMAX) 0.4 MG CAPS capsule Take 0.4 mg by mouth daily.  0  . HYDROcodone-acetaminophen (NORCO/VICODIN) 5-325 MG per tablet take 1 tablet by mouth daily if needed  0   No current facility-administered medications for this visit.    Review of Systems  Constitutional: negative Eyes: negative Ears, nose, mouth, throat, and face: negative Respiratory: negative Cardiovascular: negative Gastrointestinal: positive for abdominal pain Genitourinary:negative Integument/breast: negative Hematologic/lymphatic: negative Musculoskeletal:negative Neurological: negative Behavioral/Psych: negative Endocrine: negative Allergic/Immunologic: negative  Physical Exam  TFT:DDUKG, healthy, no distress, well nourished, well developed and anxious SKIN: skin color, texture, turgor are normal, no rashes or significant lesions HEAD: Normocephalic, No masses, lesions, tenderness or abnormalities EYES: normal, PERRLA, Conjunctiva are pink and non-injected EARS: External ears normal, Canals clear OROPHARYNX:no exudate, no erythema and lips, buccal mucosa, and tongue normal  NECK: supple, no adenopathy, no JVD LYMPH:  no palpable lymphadenopathy, no hepatosplenomegaly LUNGS: clear to auscultation , and palpation HEART: regular rate & rhythm, no murmurs and no gallops ABDOMEN:abdomen soft, non-tender, normal bowel sounds and no masses or organomegaly BACK: Back symmetric, no curvature., No CVA tenderness EXTREMITIES:no joint deformities, effusion, or inflammation, no edema, no skin discoloration, no clubbing  NEURO: alert & oriented x 3 with fluent speech, no focal motor/sensory deficits  PERFORMANCE STATUS: ECOG 1  LABORATORY DATA: Lab Results  Component Value Date   WBC 9.7  10/04/2014   HGB 14.1 10/04/2014   HCT 42.7 10/04/2014   MCV 86.5 10/04/2014   PLT 223 10/04/2014      Chemistry      Component Value Date/Time   NA 144 10/04/2014 1352   NA 141 09/27/2014 2230   K 4.3 10/04/2014 1352   K 3.9 09/27/2014 2230   CL 108 09/27/2014 2230   CO2 28 10/04/2014 1352   CO2 26 09/27/2014 2230   BUN 11.6 10/04/2014 1352   BUN 13 09/27/2014 2230   CREATININE 1.1 10/04/2014 1352   CREATININE 1.10 09/27/2014 2230      Component Value Date/Time   CALCIUM 9.3 10/04/2014 1352   CALCIUM 9.2 09/27/2014 2230   ALKPHOS 85 10/04/2014 1352   ALKPHOS 71 09/27/2014 2230   AST 22 10/04/2014 1352   AST 29 09/27/2014 2230   ALT 26 10/04/2014 1352   ALT 30 09/27/2014 2230   BILITOT 0.30 10/04/2014 1352   BILITOT 0.5 09/27/2014 2230       RADIOGRAPHIC STUDIES: Ct Abdomen Pelvis W Contrast  09/28/2014   CLINICAL DATA:  59 year old male with right inguinal hernia and pain.  EXAM: CT ABDOMEN AND PELVIS WITH CONTRAST  TECHNIQUE: Multidetector  CT imaging of the abdomen and pelvis was performed using the standard protocol following bolus administration of intravenous contrast.  CONTRAST:  68m OMNIPAQUE IOHEXOL 300 MG/ML SOLN, 1038mOMNIPAQUE IOHEXOL 300 MG/ML SOLN  COMPARISON:  Radiograph dated 06/30/2014  FINDINGS: The visualized lung bases are clear. No intra-abdominal free air or free fluid.  The liver, gallbladder, pancreas, spleen, adrenal glands, kidneys, visualized ureters, and urinary bladder appear unremarkable. The prostate and seminal vesicles are grossly unremarkable.  There is sigmoid diverticulosis with muscular hypertrophy. No definite active inflammation. Moderate stool noted throughout the colon. No evidence of bowel obstruction. The appendix appears unremarkable.  The visualized abdominal aorta and IVC are patent. No portal venous gas identified. There is no lymphadenopathy. Small fat containing umbilical hernia. Small fat containing right inguinal hernia.  Degenerative changes of the spine. There is disc desiccation with vacuum phenomena at L3-L4. No acute fracture. Small scattered lucent lesions throughout the osseous structures concerning for malignancy such as multiple myeloma. Metastatic disease less likely but not entirely excluded. Clinical correlation is recommended.  IMPRESSION: Small fat containing right inguinal hernia. No evidence of inflammation.  Diverticulosis  Constipation. No evidence of bowel obstruction or inflammation. Normal appendix.  Innumerable osseous lucent lesions concerning for multiple myeloma. Clinical correlation is recommended.   Electronically Signed   By: ArAnner Crete.D.   On: 09/28/2014 00:52    ASSESSMENT: This is a very pleasant [age]ears old African-American male who was found recently to have innumerable lytic bone lesions on the recent CT scan of the abdomen and pelvis. This is suspicious for multiple myeloma but other etiology cannot be excluded at this point.   PLAN: I had a lengthy discussion with the patient today about the current abnormality seen in his bone and further investigation to confirm a diagnosis. I ordered several studies today including repeat CBC, comprehensive metabolic panel, LDH, quantitative immunoglobulin as well as serum light chain. I also discussed with the patient proceeding with a bone marrow biopsy and aspirate to confirm or rule out multiple myeloma. I will see the patient back for follow-up visit in 3 weeks for reevaluation and more detailed discussion of his condition based on the final biopsy and lab results. For the abdominal pain from the inguinal hernia, the patient will continue on his current treatment with hydrocodone and he was advised to get refill from his primary care physician for now. The patient was advised to call immediately if he has any concerning symptoms in the interval. The patient voices understanding of current disease status and treatment options and is in  agreement with the current care plan.  All questions were answered. The patient knows to call the clinic with any problems, questions or concerns. We can certainly see the patient much sooner if necessary.  Thank you so much for allowing me to participate in the care of Paul Brady will continue to follow up the patient with you and assist in his care.  I spent 35 minutes counseling the patient face to face. The total time spent in the appointment was 55 minutes.  Disclaimer: This note was dictated with voice recognition software. Similar sounding words can inadvertently be transcribed and may not be corrected upon review.   Taiyo Kozma K. October 04, 2014, 3:02 PM

## 2014-10-05 ENCOUNTER — Telehealth (HOSPITAL_COMMUNITY): Payer: Self-pay

## 2014-10-05 LAB — IGG, IGA, IGM
IGA: 210 mg/dL (ref 68–379)
IGM, SERUM: 39 mg/dL — AB (ref 41–251)
IgG (Immunoglobin G), Serum: 1050 mg/dL (ref 650–1600)

## 2014-10-05 LAB — KAPPA/LAMBDA LIGHT CHAINS
KAPPA LAMBDA RATIO: 1.38 (ref 0.26–1.65)
Kappa free light chain: 1.23 mg/dL (ref 0.33–1.94)
Lambda Free Lght Chn: 0.89 mg/dL (ref 0.57–2.63)

## 2014-10-07 ENCOUNTER — Encounter (HOSPITAL_COMMUNITY): Payer: Self-pay | Admitting: *Deleted

## 2014-10-10 ENCOUNTER — Ambulatory Visit (INDEPENDENT_AMBULATORY_CARE_PROVIDER_SITE_OTHER): Payer: Medicare HMO | Admitting: Neurology

## 2014-10-10 ENCOUNTER — Encounter: Payer: Self-pay | Admitting: Neurology

## 2014-10-10 VITALS — BP 130/88 | HR 81 | Ht 74.5 in | Wt 206.4 lb

## 2014-10-10 DIAGNOSIS — M5481 Occipital neuralgia: Secondary | ICD-10-CM

## 2014-10-10 MED ORDER — TIZANIDINE HCL 2 MG PO CAPS: 2.0000 mg | ORAL_CAPSULE | Freq: Every evening | ORAL | Status: DC | PRN

## 2014-10-10 MED ORDER — GABAPENTIN 300 MG PO CAPS: 300.0000 mg | ORAL_CAPSULE | Freq: Every day | ORAL | Status: DC

## 2014-10-10 NOTE — Patient Instructions (Addendum)
Since you are doing well, you may stop the medication and see if you really need it.  If your pain returns, go back to taking gabapentin 300mg  at bedtime and tizanidine 2mg  at bedtime

## 2014-10-10 NOTE — Progress Notes (Signed)
Follow-up Visit   Date: 10/10/2014    Paul Brady MRN: [id number] DOB: 1955-08-03   Interim History: Paul Brady is a 59 y.o. 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  The patient was accompanied to the clinic by self.  History of present illness: Patient reports having headaches that started in March 2016. It is located at the base of his right neck that shoots into his head, pain lasts < 6 hours. Frequency is variable and occurs several times per week. He has noticed that stress triggers it and makes it worse. Sleep and rest helps his pain. He tried ibuprofen, excedrin migraine, and heat/ice which did not help. He complains of mild tenderness over the base of the headache.   Starting in April, he started seeing floaters and blurred vision of his left eye. Denies any problems with color desaturation. He saw the eye doctor yesterday and is scheduled to see a retina specialist today.  Patient has had six emergency room visits since October 2015 for various problems including chest pain, suicidal ideation, headaches, and most likely for vision changes. Prior neurological evaluation has included MRI brain (09/2013) and CT head (05/2014, 06/2014) which was normal.   UPDATE 10/10/2014:  He reports headaches have improved, especially taking gabapentin 352m at bedtime.  He had right cataracts surgery and now has vision in this eye.  He has a few other medical issues including upcoming surgery for hernia repair and possible bone marrow biopsy to evaluate his newly found lytic bone lesions.  He has no new neurological complaints.   Medications:  Current Outpatient Prescriptions on File Prior to Visit  Medication Sig Dispense Refill  . ALPRAZolam (XANAX) 0.25 MG tablet Take 0.25 mg  by mouth 2 (two) times daily as needed for anxiety (anxiety).     .Marland KitchenamLODipine (NORVASC) 5 MG tablet Take 5 mg by mouth daily.    .Marland KitchenbuPROPion (WELLBUTRIN) 75 MG tablet Take 75 mg by mouth at bedtime.   0  . carvedilol (COREG) 3.125 MG tablet Take 3.125 mg by mouth daily.   0  . gentamicin (GARAMYCIN) 0.3 % ophthalmic solution Place 1 drop into the right eye 3 (three) times daily.   0  . nitroGLYCERIN (NITROSTAT) 0.4 MG SL tablet Place 0.4 mg under the tongue every 5 (five) minutes as needed for chest pain (chest pain).     . polyethylene glycol powder (GLYCOLAX/MIRALAX) powder Take 17 g by mouth daily as needed for moderate constipation.   0  . prednisoLONE sodium phosphate (INFLAMASE FORTE) 1 % ophthalmic solution Place 1 drop into the right eye 3 (three) times daily.   0  . QUEtiapine (SEROQUEL) 25 MG tablet Take 25 mg by mouth 2 (two) times daily.  0  . QUEtiapine (SEROQUEL) 400 MG tablet Take 400 mg by mouth at bedtime.     . tamsulosin (FLOMAX) 0.4 MG CAPS capsule Take 0.4 mg by mouth daily.  0   No current facility-administered medications on file prior to visit.    Allergies:  Allergies  Allergen Reactions  . Mold Extract [Trichophyton] Other (See Comments)    Per Allergy testing - extreme mold allergy even when on foods.   . Trazodone And Nefazodone     priapism    Review of Systems:  CONSTITUTIONAL: No fevers, chills, night sweats, or weight loss.  EYES:  No visual changes or eye pain ENT: No hearing changes.  No history of nose bleeds.   RESPIRATORY: No cough, wheezing and shortness of breath.   CARDIOVASCULAR: Negative for chest pain, and palpitations.   GI: +for abdominal discomfort, blood in stools or black stools.  No recent change in bowel habits.   GU:  No history of incontinence.   MUSCLOSKELETAL: No history of joint pain or swelling.  No myalgias.   SKIN: Negative for lesions, rash, and itching.   ENDOCRINE: Negative for cold or heat intolerance, polydipsia or  goiter.   PSYCH:  No depression or anxiety symptoms.   NEURO: As Above.   Vital Signs:  BP 130/88 mmHg  Pulse 81  Ht 6' 2.5" (1.892 m)  Wt 206 lb 7 oz (93.639 kg)  BMI 26.16 kg/m2  SpO2 99%  Neurological Exam: MENTAL STATUS including orientation to time, place, person, recent and remote memory, attention span and concentration, language, and fund of knowledge is normal.  Speech is not dysarthric.  CRANIAL NERVES: No visual field defects. Right eye is surgical, pupil is round and reactive.  Left eye with cataract, pupil round and reactive.  Normal conjugate, extra-ocular eye movements in all directions of gaze.  Face is symmetric.   MOTOR:  Motor strength is 5/5 in all extremities.  Tone is normal.    MSRs:  Reflexes are 2+/4 throughout, except 1+ Achilles bilaterally.  SENSORY:  Intact to vibration.  COORDINATION/GAIT:   Gait narrow based and stable.   Data: CT head 05/23/2014 and 06/21/2014: Normal MRI brain 10/03/2013: No evidence of infarct or other acute intracranial abnormality  IMPRESSION/PLAN: Right greater occipital neuralgia, improved  - since he is doing well, recommend trial off medication to see if he, indeed, still needs it  - if there is worsening pain, he may go back to taking gabapentin 377m qhs and tizanidine 275mqhs - refills provided  Vision changes, improved since having right cataract surgery.     Return to clinic as needed    The duration of this appointment visit was 15 minutes of face-to-face time with the patient.  Greater than 50% of this time was spent in counseling, explanation of diagnosis, planning of further management, and coordination of care.   Thank you for allowing me to participate in patient's care.  If I can answer any additional questions, I would be pleased to do so.    Sincerely,    Donika K. PaPosey ProntoDO

## 2014-10-11 ENCOUNTER — Ambulatory Visit: Payer: Self-pay | Admitting: Neurology

## 2014-10-14 ENCOUNTER — Encounter (HOSPITAL_COMMUNITY)
Admission: RE | Disposition: A | Discharge status: Home / Self Care | Payer: Self-pay | Source: Ambulatory Visit | Attending: Surgery

## 2014-10-14 ENCOUNTER — Other Ambulatory Visit: Payer: Self-pay | Admitting: Radiology

## 2014-10-14 ENCOUNTER — Observation Stay (HOSPITAL_COMMUNITY)
Admission: RE | Admit: 2014-10-14 | Discharge: 2014-10-15 | Disposition: A | Discharge status: Home / Self Care | Payer: Medicare HMO | Source: Ambulatory Visit | Attending: Surgery | Admitting: Surgery

## 2014-10-14 ENCOUNTER — Ambulatory Visit (HOSPITAL_COMMUNITY): Payer: Medicare HMO | Admitting: Certified Registered Nurse Anesthetist

## 2014-10-14 ENCOUNTER — Encounter (HOSPITAL_COMMUNITY): Payer: Self-pay | Admitting: *Deleted

## 2014-10-14 DIAGNOSIS — Z79899 Other long term (current) drug therapy: Secondary | ICD-10-CM | POA: Insufficient documentation

## 2014-10-14 DIAGNOSIS — K219 Gastro-esophageal reflux disease without esophagitis: Secondary | ICD-10-CM | POA: Diagnosis not present

## 2014-10-14 DIAGNOSIS — Z720 Tobacco use: Secondary | ICD-10-CM | POA: Diagnosis present

## 2014-10-14 DIAGNOSIS — F17200 Nicotine dependence, unspecified, uncomplicated: Secondary | ICD-10-CM | POA: Diagnosis not present

## 2014-10-14 DIAGNOSIS — K4120 Bilateral femoral hernia, without obstruction or gangrene, not specified as recurrent: Secondary | ICD-10-CM | POA: Diagnosis not present

## 2014-10-14 DIAGNOSIS — F329 Major depressive disorder, single episode, unspecified: Secondary | ICD-10-CM | POA: Diagnosis not present

## 2014-10-14 DIAGNOSIS — F332 Major depressive disorder, recurrent severe without psychotic features: Secondary | ICD-10-CM | POA: Diagnosis present

## 2014-10-14 DIAGNOSIS — Z791 Long term (current) use of non-steroidal anti-inflammatories (NSAID): Secondary | ICD-10-CM | POA: Diagnosis not present

## 2014-10-14 DIAGNOSIS — D176 Benign lipomatous neoplasm of spermatic cord: Secondary | ICD-10-CM | POA: Diagnosis not present

## 2014-10-14 DIAGNOSIS — R29898 Other symptoms and signs involving the musculoskeletal system: Secondary | ICD-10-CM

## 2014-10-14 DIAGNOSIS — G43909 Migraine, unspecified, not intractable, without status migrainosus: Secondary | ICD-10-CM | POA: Diagnosis not present

## 2014-10-14 DIAGNOSIS — K4020 Bilateral inguinal hernia, without obstruction or gangrene, not specified as recurrent: Secondary | ICD-10-CM

## 2014-10-14 DIAGNOSIS — R531 Weakness: Secondary | ICD-10-CM | POA: Diagnosis not present

## 2014-10-14 DIAGNOSIS — K4090 Unilateral inguinal hernia, without obstruction or gangrene, not specified as recurrent: Principal | ICD-10-CM | POA: Insufficient documentation

## 2014-10-14 DIAGNOSIS — Z7952 Long term (current) use of systemic steroids: Secondary | ICD-10-CM | POA: Diagnosis not present

## 2014-10-14 DIAGNOSIS — I10 Essential (primary) hypertension: Secondary | ICD-10-CM | POA: Insufficient documentation

## 2014-10-14 HISTORY — PX: INGUINAL HERNIA REPAIR: SHX194

## 2014-10-14 SURGERY — REPAIR, HERNIA, INGUINAL, BILATERAL, LAPAROSCOPIC
Anesthesia: General | Site: Groin | Laterality: Bilateral

## 2014-10-14 MED ORDER — NAPROXEN 500 MG PO TABS
500.0000 mg | ORAL_TABLET | Freq: Two times a day (BID) | ORAL | Status: DC | PRN
  Filled 2014-10-14: qty 1

## 2014-10-14 MED ORDER — AMLODIPINE BESYLATE 5 MG PO TABS
5.0000 mg | ORAL_TABLET | Freq: Every day | ORAL | Status: DC
  Administered 2014-10-14: 5 mg via ORAL
  Filled 2014-10-14 (×3): qty 1

## 2014-10-14 MED ORDER — QUETIAPINE FUMARATE 400 MG PO TABS
400.0000 mg | ORAL_TABLET | Freq: Every day | ORAL | Status: DC
  Administered 2014-10-14: 400 mg via ORAL
  Filled 2014-10-14 (×2): qty 1

## 2014-10-14 MED ORDER — SODIUM CHLORIDE 0.9 % IJ SOLN
INTRAMUSCULAR | Status: AC
  Filled 2014-10-14: qty 10

## 2014-10-14 MED ORDER — BUPROPION HCL 75 MG PO TABS
75.0000 mg | ORAL_TABLET | Freq: Every day | ORAL | Status: DC
  Administered 2014-10-14: 75 mg via ORAL
  Filled 2014-10-14 (×2): qty 1

## 2014-10-14 MED ORDER — SODIUM CHLORIDE 0.9 % IJ SOLN
3.0000 mL | Freq: Two times a day (BID) | INTRAMUSCULAR | Status: DC
  Administered 2014-10-14: 3 mL via INTRAVENOUS

## 2014-10-14 MED ORDER — ROCURONIUM BROMIDE 100 MG/10ML IV SOLN
INTRAVENOUS | Status: DC | PRN
  Administered 2014-10-14: 50 mg via INTRAVENOUS
  Administered 2014-10-14: 10 mg via INTRAVENOUS

## 2014-10-14 MED ORDER — EPHEDRINE SULFATE 50 MG/ML IJ SOLN
INTRAMUSCULAR | Status: AC
  Filled 2014-10-14: qty 1

## 2014-10-14 MED ORDER — SODIUM CHLORIDE 0.9 % IJ SOLN: 3.0000 mL | INTRAMUSCULAR | Status: DC | PRN

## 2014-10-14 MED ORDER — STERILE WATER FOR IRRIGATION IR SOLN
Status: DC | PRN
  Administered 2014-10-14: 2000 mL

## 2014-10-14 MED ORDER — KETOROLAC TROMETHAMINE 30 MG/ML IJ SOLN
INTRAMUSCULAR | Status: DC | PRN
  Administered 2014-10-14: 30 mg via INTRAVENOUS

## 2014-10-14 MED ORDER — BUPIVACAINE-EPINEPHRINE 0.25% -1:200000 IJ SOLN
INTRAMUSCULAR | Status: DC | PRN
  Administered 2014-10-14: 80 mL

## 2014-10-14 MED ORDER — LACTATED RINGERS IV SOLN: INTRAVENOUS | Status: DC

## 2014-10-14 MED ORDER — ACETAMINOPHEN 500 MG PO TABS
1000.0000 mg | ORAL_TABLET | Freq: Three times a day (TID) | ORAL | Status: DC
  Administered 2014-10-14 – 2014-10-15 (×2): 1000 mg via ORAL
  Filled 2014-10-14 (×3): qty 2

## 2014-10-14 MED ORDER — LIDOCAINE HCL (CARDIAC) 20 MG/ML IV SOLN
INTRAVENOUS | Status: AC
  Filled 2014-10-14: qty 5

## 2014-10-14 MED ORDER — FENTANYL CITRATE (PF) 100 MCG/2ML IJ SOLN: 25.0000 ug | INTRAMUSCULAR | Status: DC | PRN

## 2014-10-14 MED ORDER — OXYCODONE HCL 5 MG PO TABS: 5.0000 mg | ORAL_TABLET | ORAL | Status: DC | PRN

## 2014-10-14 MED ORDER — CHLORHEXIDINE GLUCONATE 4 % EX LIQD: 1.0000 "application " | Freq: Once | CUTANEOUS | Status: DC

## 2014-10-14 MED ORDER — CEFAZOLIN SODIUM-DEXTROSE 2-3 GM-% IV SOLR
INTRAVENOUS | Status: AC
  Filled 2014-10-14: qty 50

## 2014-10-14 MED ORDER — GLYCOPYRROLATE 0.2 MG/ML IJ SOLN
INTRAMUSCULAR | Status: AC
  Filled 2014-10-14: qty 3

## 2014-10-14 MED ORDER — NEOSTIGMINE METHYLSULFATE 10 MG/10ML IV SOLN
INTRAVENOUS | Status: DC | PRN
  Administered 2014-10-14: 5 mg via INTRAVENOUS

## 2014-10-14 MED ORDER — PROMETHAZINE HCL 25 MG/ML IJ SOLN: 6.2500 mg | INTRAMUSCULAR | Status: DC | PRN

## 2014-10-14 MED ORDER — NITROGLYCERIN 0.4 MG SL SUBL: 0.4000 mg | SUBLINGUAL_TABLET | SUBLINGUAL | Status: DC | PRN

## 2014-10-14 MED ORDER — PROPOFOL 10 MG/ML IV BOLUS
INTRAVENOUS | Status: DC | PRN
  Administered 2014-10-14: 200 mg via INTRAVENOUS

## 2014-10-14 MED ORDER — FENTANYL CITRATE (PF) 250 MCG/5ML IJ SOLN
INTRAMUSCULAR | Status: AC
  Filled 2014-10-14: qty 25

## 2014-10-14 MED ORDER — EPHEDRINE SULFATE 50 MG/ML IJ SOLN
INTRAMUSCULAR | Status: DC | PRN
  Administered 2014-10-14 (×2): 10 mg via INTRAVENOUS

## 2014-10-14 MED ORDER — KETOROLAC TROMETHAMINE 30 MG/ML IJ SOLN
INTRAMUSCULAR | Status: AC
  Filled 2014-10-14: qty 1

## 2014-10-14 MED ORDER — CARVEDILOL 3.125 MG PO TABS
3.1250 mg | ORAL_TABLET | Freq: Every day | ORAL | Status: DC
  Administered 2014-10-14: 3.125 mg via ORAL
  Filled 2014-10-14 (×2): qty 1

## 2014-10-14 MED ORDER — QUETIAPINE FUMARATE 25 MG PO TABS
25.0000 mg | ORAL_TABLET | Freq: Two times a day (BID) | ORAL | Status: DC
  Administered 2014-10-15: 25 mg via ORAL
  Filled 2014-10-14 (×3): qty 1

## 2014-10-14 MED ORDER — ACETAMINOPHEN 650 MG RE SUPP: 650.0000 mg | Freq: Four times a day (QID) | RECTAL | Status: DC | PRN

## 2014-10-14 MED ORDER — TAMSULOSIN HCL 0.4 MG PO CAPS
0.4000 mg | ORAL_CAPSULE | Freq: Every evening | ORAL | Status: DC
  Filled 2014-10-14 (×2): qty 1

## 2014-10-14 MED ORDER — LACTATED RINGERS IV SOLN
INTRAVENOUS | Status: DC | PRN
  Administered 2014-10-14: 13:00:00 via INTRAVENOUS

## 2014-10-14 MED ORDER — POLYETHYLENE GLYCOL 3350 17 GM/SCOOP PO POWD
17.0000 g | Freq: Every day | ORAL | Status: DC | PRN
  Filled 2014-10-14: qty 255

## 2014-10-14 MED ORDER — ALUM & MAG HYDROXIDE-SIMETH 200-200-20 MG/5ML PO SUSP: 30.0000 mL | Freq: Four times a day (QID) | ORAL | Status: DC | PRN

## 2014-10-14 MED ORDER — ONDANSETRON HCL 4 MG/2ML IJ SOLN
INTRAMUSCULAR | Status: AC
  Filled 2014-10-14: qty 2

## 2014-10-14 MED ORDER — OXYCODONE HCL 5 MG PO TABS
5.0000 mg | ORAL_TABLET | ORAL | Status: DC | PRN
  Administered 2014-10-14: 5 mg via ORAL
  Filled 2014-10-14: qty 1

## 2014-10-14 MED ORDER — DEXAMETHASONE SODIUM PHOSPHATE 10 MG/ML IJ SOLN
INTRAMUSCULAR | Status: DC | PRN
  Administered 2014-10-14: 10 mg via INTRAVENOUS

## 2014-10-14 MED ORDER — SODIUM CHLORIDE 0.9 % IV SOLN: 250.0000 mL | INTRAVENOUS | Status: DC | PRN

## 2014-10-14 MED ORDER — MEPERIDINE HCL 50 MG/ML IJ SOLN
INTRAMUSCULAR | Status: AC
  Filled 2014-10-14: qty 1

## 2014-10-14 MED ORDER — MIDAZOLAM HCL 5 MG/5ML IJ SOLN
INTRAMUSCULAR | Status: DC | PRN
  Administered 2014-10-14: 2 mg via INTRAVENOUS

## 2014-10-14 MED ORDER — SODIUM CHLORIDE 0.9 % IJ SOLN
3.0000 mL | Freq: Two times a day (BID) | INTRAMUSCULAR | Status: DC
  Administered 2014-10-15: 3 mL via INTRAVENOUS

## 2014-10-14 MED ORDER — CEFAZOLIN SODIUM-DEXTROSE 2-3 GM-% IV SOLR
2.0000 g | INTRAVENOUS | Status: AC
  Administered 2014-10-14: 2 g via INTRAVENOUS

## 2014-10-14 MED ORDER — LIP MEDEX EX OINT
1.0000 "application " | TOPICAL_OINTMENT | Freq: Two times a day (BID) | CUTANEOUS | Status: DC
  Administered 2014-10-14 – 2014-10-15 (×2): 1 via TOPICAL
  Filled 2014-10-14: qty 7

## 2014-10-14 MED ORDER — ROCURONIUM BROMIDE 100 MG/10ML IV SOLN
INTRAVENOUS | Status: AC
  Filled 2014-10-14: qty 1

## 2014-10-14 MED ORDER — MEPERIDINE HCL 50 MG/ML IJ SOLN
6.2500 mg | INTRAMUSCULAR | Status: DC | PRN
  Administered 2014-10-14: 6.25 mg via INTRAVENOUS

## 2014-10-14 MED ORDER — 0.9 % SODIUM CHLORIDE (POUR BTL) OPTIME
TOPICAL | Status: DC | PRN
  Administered 2014-10-14: 1000 mL

## 2014-10-14 MED ORDER — MAGIC MOUTHWASH
15.0000 mL | Freq: Four times a day (QID) | ORAL | Status: DC | PRN
  Filled 2014-10-14: qty 15

## 2014-10-14 MED ORDER — FENTANYL CITRATE (PF) 100 MCG/2ML IJ SOLN
INTRAMUSCULAR | Status: DC | PRN
  Administered 2014-10-14 (×5): 50 ug via INTRAVENOUS

## 2014-10-14 MED ORDER — NAPROXEN 500 MG PO TABS: 500.0000 mg | ORAL_TABLET | Freq: Two times a day (BID) | ORAL | Status: DC

## 2014-10-14 MED ORDER — BUPIVACAINE-EPINEPHRINE 0.25% -1:200000 IJ SOLN
INTRAMUSCULAR | Status: AC
  Filled 2014-10-14: qty 1

## 2014-10-14 MED ORDER — OXYCODONE HCL 5 MG PO TABS
5.0000 mg | ORAL_TABLET | ORAL | Status: DC | PRN
  Administered 2014-10-14 – 2014-10-15 (×3): 5 mg via ORAL
  Administered 2014-10-15: 10 mg via ORAL
  Filled 2014-10-14 (×3): qty 1
  Filled 2014-10-14: qty 2

## 2014-10-14 MED ORDER — SUCCINYLCHOLINE CHLORIDE 20 MG/ML IJ SOLN
INTRAMUSCULAR | Status: DC | PRN
  Administered 2014-10-14: 100 mg via INTRAVENOUS

## 2014-10-14 MED ORDER — BUPIVACAINE-EPINEPHRINE (PF) 0.25% -1:200000 IJ SOLN
INTRAMUSCULAR | Status: AC
  Filled 2014-10-14: qty 30

## 2014-10-14 MED ORDER — DEXAMETHASONE SODIUM PHOSPHATE 10 MG/ML IJ SOLN
INTRAMUSCULAR | Status: AC
  Filled 2014-10-14: qty 1

## 2014-10-14 MED ORDER — FENTANYL CITRATE (PF) 100 MCG/2ML IJ SOLN
INTRAMUSCULAR | Status: AC
  Administered 2014-10-14: 50 ug via INTRAVENOUS
  Filled 2014-10-14: qty 2

## 2014-10-14 MED ORDER — FENTANYL CITRATE (PF) 100 MCG/2ML IJ SOLN
25.0000 ug | INTRAMUSCULAR | Status: DC | PRN
  Administered 2014-10-14: 50 ug via INTRAVENOUS
  Administered 2014-10-14 (×2): 25 ug via INTRAVENOUS

## 2014-10-14 MED ORDER — PROPOFOL 10 MG/ML IV BOLUS
INTRAVENOUS | Status: AC
  Filled 2014-10-14: qty 20

## 2014-10-14 MED ORDER — SODIUM CHLORIDE 0.9 % IJ SOLN
3.0000 mL | INTRAMUSCULAR | Status: DC | PRN
  Administered 2014-10-14: 3 mL via INTRAVENOUS
  Filled 2014-10-14: qty 3

## 2014-10-14 MED ORDER — GLYCOPYRROLATE 0.2 MG/ML IJ SOLN
INTRAMUSCULAR | Status: DC | PRN
  Administered 2014-10-14: 0.6 mg via INTRAVENOUS
  Administered 2014-10-14: 0.2 mg via INTRAVENOUS

## 2014-10-14 MED ORDER — GLYCOPYRROLATE 0.2 MG/ML IJ SOLN
INTRAMUSCULAR | Status: AC
  Filled 2014-10-14: qty 1

## 2014-10-14 MED ORDER — DIPHENHYDRAMINE HCL 50 MG/ML IJ SOLN: 12.5000 mg | Freq: Four times a day (QID) | INTRAMUSCULAR | Status: DC | PRN

## 2014-10-14 MED ORDER — PHENOL 1.4 % MT LIQD
2.0000 | OROMUCOSAL | Status: DC | PRN
  Filled 2014-10-14: qty 177

## 2014-10-14 MED ORDER — GABAPENTIN 300 MG PO CAPS
300.0000 mg | ORAL_CAPSULE | Freq: Every day | ORAL | Status: DC
  Administered 2014-10-14: 300 mg via ORAL
  Filled 2014-10-14 (×2): qty 1

## 2014-10-14 MED ORDER — ACETAMINOPHEN 325 MG PO TABS
325.0000 mg | ORAL_TABLET | Freq: Four times a day (QID) | ORAL | Status: DC | PRN
  Administered 2014-10-14: 325 mg via ORAL
  Filled 2014-10-14: qty 2

## 2014-10-14 MED ORDER — LIDOCAINE HCL (CARDIAC) 20 MG/ML IV SOLN
INTRAVENOUS | Status: DC | PRN
  Administered 2014-10-14: 100 mg via INTRAVENOUS

## 2014-10-14 MED ORDER — MENTHOL 3 MG MT LOZG: 1.0000 | LOZENGE | OROMUCOSAL | Status: DC | PRN

## 2014-10-14 MED ORDER — ONDANSETRON HCL 4 MG/2ML IJ SOLN
INTRAMUSCULAR | Status: DC | PRN
  Administered 2014-10-14: 4 mg via INTRAVENOUS

## 2014-10-14 MED ORDER — TIZANIDINE HCL 2 MG PO CAPS
2.0000 mg | ORAL_CAPSULE | Freq: Every evening | ORAL | Status: DC | PRN
  Filled 2014-10-14: qty 1

## 2014-10-14 MED ORDER — MIDAZOLAM HCL 2 MG/2ML IJ SOLN
INTRAMUSCULAR | Status: AC
  Filled 2014-10-14: qty 4

## 2014-10-14 MED ORDER — ALPRAZOLAM 0.25 MG PO TABS
0.2500 mg | ORAL_TABLET | Freq: Two times a day (BID) | ORAL | Status: DC | PRN
  Administered 2014-10-14: 0.25 mg via ORAL
  Filled 2014-10-14: qty 1

## 2014-10-14 SURGICAL SUPPLY — 30 items
CABLE HIGH FREQUENCY MONO STRZ (ELECTRODE) ×2 IMPLANT
CHLORAPREP W/TINT 26ML (MISCELLANEOUS) ×2 IMPLANT
COVER SURGICAL LIGHT HANDLE (MISCELLANEOUS) ×2 IMPLANT
DECANTER SPIKE VIAL GLASS SM (MISCELLANEOUS) ×2 IMPLANT
DEVICE SECURE STRAP 25 ABSORB (INSTRUMENTS) IMPLANT
DRAPE LAPAROSCOPIC ABDOMINAL (DRAPES) ×2 IMPLANT
DRAPE UTILITY XL STRL (DRAPES) ×2 IMPLANT
DRAPE WARM FLUID 44X44 (DRAPE) ×2 IMPLANT
DRSG TEGADERM 2-3/8X2-3/4 SM (GAUZE/BANDAGES/DRESSINGS) ×2 IMPLANT
DRSG TEGADERM 4X4.75 (GAUZE/BANDAGES/DRESSINGS) ×2 IMPLANT
ELECT REM PT RETURN 9FT ADLT (ELECTROSURGICAL) ×2
ELECTRODE REM PT RTRN 9FT ADLT (ELECTROSURGICAL) ×1 IMPLANT
GAUZE SPONGE 2X2 8PLY STRL LF (GAUZE/BANDAGES/DRESSINGS) ×1 IMPLANT
GLOVE ECLIPSE 8.0 STRL XLNG CF (GLOVE) ×2 IMPLANT
GLOVE INDICATOR 8.0 STRL GRN (GLOVE) ×2 IMPLANT
GOWN STRL REUS W/TWL XL LVL3 (GOWN DISPOSABLE) ×4 IMPLANT
KIT BASIN OR (CUSTOM PROCEDURE TRAY) ×2 IMPLANT
MESH ULTRAPRO 6X6 15CM15CM (Mesh General) ×4 IMPLANT
SCISSORS LAP 5X35 DISP (ENDOMECHANICALS) ×2 IMPLANT
SET IRRIG TUBING LAPAROSCOPIC (IRRIGATION / IRRIGATOR) IMPLANT
SLEEVE XCEL OPT CAN 5 100 (ENDOMECHANICALS) ×2 IMPLANT
SPONGE GAUZE 2X2 STER 10/PKG (GAUZE/BANDAGES/DRESSINGS) ×1
SUT MNCRL AB 4-0 PS2 18 (SUTURE) ×2 IMPLANT
SUT VIC AB 3-0 SH 27 (SUTURE) ×2
SUT VIC AB 3-0 SH 27XBRD (SUTURE) ×2 IMPLANT
TACKER 5MM HERNIA 3.5CML NAB (ENDOMECHANICALS) IMPLANT
TOWEL OR 17X26 10 PK STRL BLUE (TOWEL DISPOSABLE) ×2 IMPLANT
TRAY LAPAROSCOPIC (CUSTOM PROCEDURE TRAY) ×2 IMPLANT
TROCAR BLADELESS OPT 5 100 (ENDOMECHANICALS) ×2 IMPLANT
TROCAR XCEL BLUNT TIP 100MML (ENDOMECHANICALS) ×2 IMPLANT

## 2014-10-14 NOTE — Progress Notes (Addendum)
Attempted to assist patient to a standing position after laparoscopic bilateral inguinal hernia repair. Patient's legs buckle out from under him and he sits back down on the bed. Obtained gait belt and walker and second person assist. Patient is unable to weight bear.   Called Dr Aris Lot from anesthesia. There is nothing given during surgery (local) which would result in this.  Paged Dr Johney Maine: Informed him that patient's legs are buckling under neath him. Surgery was very routine as per Dr Johney Maine. Will continue to observe patient. Will get him transferred to recliner. If patient is unable to weight bear, will admit over night for observation.  1720  Up to recliner chair using gait belt and walker. Patient is able to weight bear well on right leg and pivot to chair. Lost balance after two steps and sat down hard in recliner chair.  1750  Ambulated to BR with gait belt and walker. Patient is able to weight bear well on his right leg. He states his left knee is numb. Able to void in bathroom. Ambulated back to recliner with assist of gait belt and walker. Patient states his left knee is numb and he is unable to left leg as he is able to lift his right leg. Called bed control to get bed for overnight observation.  1645  Report to Rosana Hoes RN. Patient admitted to 1619.

## 2014-10-14 NOTE — Anesthesia Preprocedure Evaluation (Signed)
Anesthesia Evaluation  Patient identified by MRN, date of birth, ID band Patient awake    Reviewed: Allergy & Precautions, NPO status , Patient's Chart, lab work & pertinent test results  Airway Mallampati: II  TM Distance: >3 FB Neck ROM: Full    Dental no notable dental hx.    Pulmonary Current Smoker,  breath sounds clear to auscultation  Pulmonary exam normal       Cardiovascular hypertension, Pt. on medications Normal cardiovascular examRhythm:Regular Rate:Normal     Neuro/Psych negative neurological ROS  negative psych ROS   GI/Hepatic negative GI ROS, Neg liver ROS,   Endo/Other  negative endocrine ROS  Renal/GU negative Renal ROS  negative genitourinary   Musculoskeletal negative musculoskeletal ROS (+)   Abdominal   Peds negative pediatric ROS (+)  Hematology negative hematology ROS (+)   Anesthesia Other Findings   Reproductive/Obstetrics negative OB ROS                             Anesthesia Physical Anesthesia Plan  ASA: II  Anesthesia Plan: General   Post-op Pain Management:    Induction: Intravenous  Airway Management Planned: Oral ETT  Additional Equipment:   Intra-op Plan:   Post-operative Plan: Extubation in OR  Informed Consent: I have reviewed the patients History and Physical, chart, labs and discussed the procedure including the risks, benefits and alternatives for the proposed anesthesia with the patient or authorized representative who has indicated his/her understanding and acceptance.   Dental advisory given  Plan Discussed with: CRNA  Anesthesia Plan Comments:         Anesthesia Quick Evaluation

## 2014-10-14 NOTE — Transfer of Care (Signed)
Immediate Anesthesia Transfer of Care Note  Patient: Paul Brady  Procedure(s) Performed: Procedure(s): LAPAROSCOPIC BILATERAL INGUINAL HERNIA REPAIRS WITH MESH (Bilateral)  Patient Location: PACU  Anesthesia Type:General  Level of Consciousness:  sedated, patient cooperative and responds to stimulation  Airway & Oxygen Therapy:Patient Spontanous Breathing and Patient connected to face mask oxgen  Post-op Assessment:  Report given to PACU RN and Post -op Vital signs reviewed and stable  Post vital signs:  Reviewed and stable  Last Vitals:  Filed Vitals:   10/14/14 1023  BP: 162/94  Pulse: 68  Temp: 36.7 C  Resp: 18    Complications: No apparent anesthesia complications

## 2014-10-14 NOTE — Discharge Instructions (Signed)
HERNIA REPAIR: POST OP INSTRUCTIONS ° °1. DIET: Follow a light bland diet the first 24 hours after arrival home, such as soup, liquids, crackers, etc.  Be sure to include lots of fluids daily.  Avoid fast food or heavy meals as your are more likely to get nauseated.  Eat a low fat the next few days after surgery. °2. Take your usually prescribed home medications unless otherwise directed. °3. PAIN CONTROL: °a. Pain is best controlled by a usual combination of three different methods TOGETHER: °i. Ice/Heat °ii. Over the counter pain medication °iii. Prescription pain medication °b. Most patients will experience some swelling and bruising around the hernia(s) such as the bellybutton, groins, or old incisions.  Ice packs or heating pads (30-60 minutes up to 6 times a day) will help. Use ice for the first few days to help decrease swelling and bruising, then switch to heat to help relax tight/sore spots and speed recovery.  Some people prefer to use ice alone, heat alone, alternating between ice & heat.  Experiment to what works for you.  Swelling and bruising can take several weeks to resolve.   °c. It is helpful to take an over-the-counter pain medication regularly for the first few weeks.  Choose one of the following that works best for you: °i. Naproxen (Aleve, etc)  Two 220mg tabs twice a day °ii. Ibuprofen (Advil, etc) Three 200mg tabs four times a day (every meal & bedtime) °iii. Acetaminophen (Tylenol, etc) 325-650mg four times a day (every meal & bedtime) °d. A  prescription for pain medication should be given to you upon discharge.  Take your pain medication as prescribed.  °i. If you are having problems/concerns with the prescription medicine (does not control pain, nausea, vomiting, rash, itching, etc), please call us [phone] to see if we need to switch you to a different pain medicine that will work better for you and/or control your side effect better. °ii. If you need a refill on your pain  medication, please contact your pharmacy.  They will contact our office to request authorization. Prescriptions will not be filled after 5 pm or on week-ends. °4. Avoid getting constipated.  Between the surgery and the pain medications, it is common to experience some constipation.  Increasing fluid intake and taking a fiber supplement (such as Metamucil, Citrucel, FiberCon, MiraLax, etc) 1-2 times a day regularly will usually help prevent this problem from occurring.  A mild laxative (prune juice, Milk of Magnesia, MiraLax, etc) should be taken according to package directions if there are no bowel movements after 48 hours.   °5. Wash / shower every day.  You may shower over the dressings as they are waterproof.   °6. Remove your waterproof bandages 5 days after surgery.  You may leave the incision open to air.  You may replace a dressing/Band-Aid to cover the incision for comfort if you wish.  Continue to shower over incision(s) after the dressing is off. ° ° ° °7. ACTIVITIES as tolerated:   °a. You may resume regular (light) daily activities beginning the next day--such as daily self-care, walking, climbing stairs--gradually increasing activities as tolerated.  If you can walk 30 minutes without difficulty, it is safe to try more intense activity such as jogging, treadmill, bicycling, low-impact aerobics, swimming, etc. °b. Save the most intensive and strenuous activity for last such as sit-ups, heavy lifting, contact sports, etc  Refrain from any heavy lifting or straining until you are off narcotics for pain control.   °  c. DO NOT PUSH THROUGH PAIN.  Let pain be your guide: If it hurts to do something, don't do it.  Pain is your body warning you to avoid that activity for another week until the pain goes down. °d. You may drive when you are no longer taking prescription pain medication, you can comfortably wear a seatbelt, and you can safely maneuver your car and apply brakes. °e. You may have sexual intercourse  when it is comfortable.  °8. FOLLOW UP in our office °a. Please call [facility] at [phone] to set up an appointment to see your surgeon in the office for a follow-up appointment approximately 2-3 weeks after your surgery. °b. Make sure that you call for this appointment the day you arrive home to insure a convenient appointment time. °9.  IF YOU HAVE DISABILITY OR FAMILY LEAVE FORMS, BRING THEM TO THE OFFICE FOR PROCESSING.  DO NOT GIVE THEM TO YOUR DOCTOR. ° °WHEN TO CALL US [phone]: °1. Poor pain control °2. Reactions / problems with new medications (rash/itching, nausea, etc)  °3. Fever over 101.5 F (38.5 C) °4. Inability to urinate °5. Nausea and/or vomiting °6. Worsening swelling or bruising °7. Continued bleeding from incision. °8. Increased pain, redness, or drainage from the incision ° ° The clinic staff is available to answer your questions during regular business hours (8:30am-5pm).  Please don’t hesitate to call and ask to speak to one of our nurses for clinical concerns.  ° If you have a medical emergency, go to the nearest emergency room or call 911. ° A surgeon from [facility] is always on call at the hospitals in [place] ° °[facility] °[address], [address], [place], [place]  [address] ? ° [address] [address], [place], [place]   [address] °MAIN: [phone] ? TOLL FREE: [phone] ? FAX: [phone] °[email or web address] ° °Managing Pain ° °Pain after surgery or related to activity is often due to strain/injury to muscle, tendon, nerves and/or incisions.  This pain is usually short-term and will improve in a few months.  ° °Many people find it helpful to do the following things TOGETHER to help speed the process of healing and to get back to regular activity more quickly: ° °1. Avoid heavy physical activity at first °a. No lifting greater than 20 pounds at first, then increase to lifting as tolerated over the next few weeks °b. Do not “push  through” the pain.  Listen to your body and avoid positions and maneuvers than reproduce the pain.  Wait a few days before trying something more intense °c. Walking is okay as tolerated, but go slowly and stop when getting sore.  If you can walk 30 minutes without stopping or pain, you can try more intense activity (running, jogging, aerobics, cycling, swimming, treadmill, sex, sports, weightlifting, etc ) °d. Remember: If it hurts to do it, then don’t do it! ° °2. Take Anti-inflammatory medication °a. Choose ONE of the following over-the-counter medications: °i.            Acetaminophen 500mg tabs (Tylenol) 1-2 pills with every meal and just before bedtime (avoid if you have liver problems) °ii.            Naproxen 220mg tabs (ex. Aleve) 1-2 pills twice a day (avoid if you have kidney, stomach, IBD, or bleeding problems) °iii. Ibuprofen 200mg tabs (ex. Advil, Motrin) 3-4 pills with every meal and just before bedtime (avoid if you have kidney, stomach, IBD, or bleeding   problems) °b. Take with food/snack around the clock for 1-2 weeks °i. This helps the muscle and nerve tissues become less irritable and calm down faster ° °3. Use a Heating pad or Ice/Cold Pack °a. 4-6 times a day °b. May use warm bath/hottub  or showers ° °4. Try Gentle Massage and/or Stretching  °a. at the area of pain many times a day °b. stop if you feel pain - do not overdo it ° °Try these steps together to help you body heal faster and avoid making things get worse.  Doing just one of these things may not be enough.   ° °If you are not getting better after two weeks or are noticing you are getting worse, contact our office for further advice; we may need to re-evaluate you & see what other things we can do to help. ° °GETTING TO GOOD BOWEL HEALTH. °Irregular bowel habits such as constipation and diarrhea can lead to many problems over time.  Having one soft bowel movement a day is the most important way to prevent further problems.  The  anorectal canal is designed to handle stretching and feces to safely manage our ability to get rid of solid waste (feces, poop, stool) out of our body.  BUT, hard constipated stools can act like ripping concrete bricks and diarrhea can be a burning fire to this very sensitive area of our body, causing inflamed hemorrhoids, anal fissures, increasing risk is perirectal abscesses, abdominal pain/bloating, an making irritable bowel worse.     ° °The goal: ONE SOFT BOWEL MOVEMENT A DAY!  To have soft, regular bowel movements:  °• Drink plenty of fluids, consider 4-6 tall glasses of water a day.   °• Take plenty of fiber.  Fiber is the undigested part of plant food that passes into the colon, acting s “natures broom” to encourage bowel motility and movement.  Fiber can absorb and hold large amounts of water. This results in a larger, bulkier stool, which is soft and easier to pass. Work gradually over several weeks up to 6 servings a day of fiber (25g a day even more if needed) in the form of: °o Vegetables -- Root (potatoes, carrots, turnips), leafy green (lettuce, salad greens, celery, spinach), or cooked high residue (cabbage, broccoli, etc) °o Fruit -- Fresh (unpeeled skin & pulp), Dried (prunes, apricots, cherries, etc ),  or stewed ( applesauce)  °o Whole grain breads, pasta, etc (whole wheat)  °o Bran cereals  °• Bulking Agents -- This type of water-retaining fiber generally is easily obtained each day by one of the following:  °o Psyllium bran -- The psyllium plant is remarkable because its ground seeds can retain so much water. This product is available as Metamucil, Konsyl, Effersyllium, Per Diem Fiber, or the less expensive generic preparation in drug and health food stores. Although labeled a laxative, it really is not a laxative.  °o Methylcellulose -- This is another fiber derived from wood which also retains water. It is available as Citrucel. °o Polyethylene Glycol - and “artificial” fiber commonly called  Miralax or Glycolax.  It is helpful for people with gassy or bloated feelings with regular fiber °o Flax Seed - a less gassy fiber than psyllium °• No reading or other relaxing activity while on the toilet. If bowel movements take longer than 5 minutes, you are too constipated °• AVOID CONSTIPATION.  High fiber and water intake usually takes care of this.  Sometimes a laxative is needed to stimulate more frequent   bowel movements, but   Laxatives are not a good long-term solution as it can wear the colon out.  They can help jump-start bowels if constipated, but should be relied on constantly without discussing with your doctor o Osmotics (Milk of Magnesia, Fleets phosphosoda, Magnesium citrate, MiraLax, GoLytely) are safer than  o Stimulants (Senokot, Castor Oil, Dulcolax, Ex Lax)    o Avoid taking laxatives for more than 7 days in a row.   IF SEVERELY CONSTIPATED, try a Bowel Retraining Program: o Do not use laxatives.  o Eat a diet high in roughage, such as bran cereals and leafy vegetables.  o Drink six (6) ounces of prune or apricot juice each morning.  o Eat two (2) large servings of stewed fruit each day.  o Take one (1) heaping tablespoon of a psyllium-based bulking agent twice a day. Use sugar-free sweetener when possible to avoid excessive calories.  o Eat a normal breakfast.  o Set aside 15 minutes after breakfast to sit on the toilet, but do not strain to have a bowel movement.  o If you do not have a bowel movement by the third day, use an enema and repeat the above steps.   Controlling diarrhea o Switch to liquids and simpler foods for a few days to avoid stressing your intestines further. o Avoid dairy products (especially milk & ice cream) for a short time.  The intestines often can lose the ability to digest lactose when stressed. o Avoid foods that cause gassiness or bloating.  Typical foods include beans and other legumes, cabbage, broccoli, and dairy foods.  Every person has  some sensitivity to other foods, so listen to our body and avoid those foods that trigger problems for you. o Adding fiber (Citrucel, Metamucil, psyllium, Miralax) gradually can help thicken stools by absorbing excess fluid and retrain the intestines to act more normally.  Slowly increase the dose over a few weeks.  Too much fiber too soon can backfire and cause cramping & bloating. o Probiotics (such as active yogurt, Align, etc) may help repopulate the intestines and colon with normal bacteria and calm down a sensitive digestive tract.  Most studies show it to be of mild help, though, and such products can be costly. o Medicines: - Bismuth subsalicylate (ex. Kayopectate, Pepto Bismol) every 30 minutes for up to 6 doses can help control diarrhea.  Avoid if pregnant. - Loperamide (Immodium) can slow down diarrhea.  Start with two tablets (4mg  total) first and then try one tablet every 6 hours.  Avoid if you are having fevers or severe pain.  If you are not better or start feeling worse, stop all medicines and call your doctor for advice o Call your doctor if you are getting worse or not better.  Sometimes further testing (cultures, endoscopy, X-ray studies, bloodwork, etc) may be needed to help diagnose and treat the cause of the diarrhea.  TROUBLESHOOTING IRREGULAR BOWELS 1) Avoid extremes of bowel movements (no bad constipation/diarrhea) 2) Miralax 17gm mixed in 8oz. water or juice-daily. May use BID as needed.  3) Gas-x,Phazyme, etc. as needed for gas & bloating.  4) Soft,bland diet. No spicy,greasy,fried foods.  5) Prilosec over-the-counter as needed  6) May hold gluten/wheat products from diet to see if symptoms improve.  7)  May try probiotics (Align, Activa, etc) to help calm the bowels down 7) If symptoms become worse call back immediately.  Inguinal Hernia, Adult Muscles help keep everything in the body in its proper place. But if  a weak spot in the muscles develops, something can poke  through. That is called a hernia. When this happens in the lower part of the belly (abdomen), it is called an inguinal hernia. (It takes its name from a part of the body in this region called the inguinal canal.) A weak spot in the wall of muscles lets some fat or part of the small intestine bulge through. An inguinal hernia can develop at any age. Men get them more often than women. CAUSES  In adults, an inguinal hernia develops over time.  It can be triggered by:  Suddenly straining the muscles of the lower abdomen.  Lifting heavy objects.  Straining to have a bowel movement. Difficult bowel movements (constipation) can lead to this.  Constant coughing. This may be caused by smoking or lung disease.  Being overweight.  Being pregnant.  Working at a job that requires long periods of standing or heavy lifting.  Having had an inguinal hernia before. One type can be an emergency situation. It is called a strangulated inguinal hernia. It develops if part of the small intestine slips through the weak spot and cannot get back into the abdomen. The blood supply can be cut off. If that happens, part of the intestine may die. This situation requires emergency surgery. SYMPTOMS  Often, a small inguinal hernia has no symptoms. It is found when a healthcare provider does a physical exam. Larger hernias usually have symptoms.   In adults, symptoms may include:  A lump in the groin. This is easier to see when the person is standing. It might disappear when lying down.  In men, a lump in the scrotum.  Pain or burning in the groin. This occurs especially when lifting, straining or coughing.  A dull ache or feeling of pressure in the groin.  Signs of a strangulated hernia can include:  A bulge in the groin that becomes very painful and tender to the touch.  A bulge that turns red or purple.  Fever, nausea and vomiting.  Inability to have a bowel movement or to pass gas. DIAGNOSIS  To  decide if you have an inguinal hernia, a healthcare provider will probably do a physical examination.  This will include asking questions about any symptoms you have noticed.  The healthcare provider might feel the groin area and ask you to cough. If an inguinal hernia is felt, the healthcare provider may try to slide it back into the abdomen.  Usually no other tests are needed. TREATMENT  Treatments can vary. The size of the hernia makes a difference. Options include:  Watchful waiting. This is often suggested if the hernia is small and you have had no symptoms.  No medical procedure will be done unless symptoms develop.  You will need to watch closely for symptoms. If any occur, contact your healthcare provider right away.  Surgery. This is used if the hernia is larger or you have symptoms.  Open surgery. This is usually an outpatient procedure (you will not stay overnight in a hospital). An cut (incision) is made through the skin in the groin. The hernia is put back inside the abdomen. The weak area in the muscles is then repaired by herniorrhaphy or hernioplasty. Herniorrhaphy: in this type of surgery, the weak muscles are sewn back together. Hernioplasty: a patch or mesh is used to close the weak area in the abdominal wall.  Laparoscopy. In this procedure, a surgeon makes small incisions. A thin tube with a tiny video  camera (called a laparoscope) is put into the abdomen. The surgeon repairs the hernia with mesh by looking with the video camera and using two long instruments. HOME CARE INSTRUCTIONS   After surgery to repair an inguinal hernia:  You will need to take pain medicine prescribed by your healthcare provider. Follow all directions carefully.  You will need to take care of the wound from the incision.  Your activity will be restricted for awhile. This will probably include no heavy lifting for several weeks. You also should not do anything too active for a few weeks. When  you can return to work will depend on the type of job that you have.  During "watchful waiting" periods, you should:  Maintain a healthy weight.  Eat a diet high in fiber (fruits, vegetables and whole grains).  Drink plenty of fluids to avoid constipation. This means drinking enough water and other liquids to keep your urine clear or pale yellow.  Do not lift heavy objects.  Do not stand for long periods of time.  Quit smoking. This should keep you from developing a frequent cough. SEEK MEDICAL CARE IF:   A bulge develops in your groin area.  You feel pain, a burning sensation or pressure in the groin. This might be worse if you are lifting or straining.  You develop a fever of more than 100.5 F (38.1 C). SEEK IMMEDIATE MEDICAL CARE IF:   Pain in the groin increases suddenly.  A bulge in the groin gets bigger suddenly and does not go down.  For men, there is sudden pain in the scrotum. Or, the size of the scrotum increases.  A bulge in the groin area becomes red or purple and is painful to touch.  You have nausea or vomiting that does not go away.  You feel your heart beating much faster than normal.  You cannot have a bowel movement or pass gas.  You develop a fever of more than 102.0 F (38.9 C). Document Released: 07/14/2008 Document Revised: 05/20/2011 Document Reviewed: 07/14/2008 [facility] Patient Information 2015 [place], [place]. This information is not intended to replace advice given to you by your health care provider. Make sure you discuss any questions you have with your health care provider.      General Anesthesia, Care After Refer to this sheet in the next few weeks. These instructions provide you with information on caring for yourself after your procedure. Your health care provider may also give you more specific instructions. Your treatment has been planned according to current medical practices, but problems sometimes occur. Call your health care  provider if you have any problems or questions after your procedure. WHAT TO EXPECT AFTER THE PROCEDURE After the procedure, it is typical to experience:  Sleepiness.  Nausea and vomiting. HOME CARE INSTRUCTIONS  For the first 24 hours after general anesthesia:  Have a responsible person with you.  Do not drive a car. If you are alone, do not take public transportation.  Do not drink alcohol.  Do not take medicine that has not been prescribed by your health care provider.  Do not sign important papers or make important decisions.  You may resume a normal diet and activities as directed by your health care provider.  Change bandages (dressings) as directed.  If you have questions or problems that seem related to general anesthesia, call the hospital and ask for the anesthetist or anesthesiologist on call. SEEK MEDICAL CARE IF:  You have nausea and vomiting that continue  the day after anesthesia.  You develop a rash. SEEK IMMEDIATE MEDICAL CARE IF:   You have difficulty breathing.  You have chest pain.  You have any allergic problems. Document Released: 06/03/2000 Document Revised: 03/02/2013 Document Reviewed: 09/10/2012 [facility] Patient Information 2015 [place], [place]. This information is not intended to replace advice given to you by your health care provider. Make sure you discuss any questions you have with your health care provider.

## 2014-10-14 NOTE — Anesthesia Procedure Notes (Signed)
Procedure Name: Intubation Date/Time: 10/14/2014 1:17 PM Performed by: Maxwell Caul Pre-anesthesia Checklist: Patient identified, Emergency Drugs available, Suction available and Patient being monitored Patient Re-evaluated:Patient Re-evaluated prior to inductionOxygen Delivery Method: Circle System Utilized Preoxygenation: Pre-oxygenation with 100% oxygen Intubation Type: IV induction Ventilation: Mask ventilation without difficulty Laryngoscope Size: Mac and 4 Grade View: Grade II Tube type: Oral Tube size: 7.5 mm Number of attempts: 1 Airway Equipment and Method: Stylet Placement Confirmation: ETT inserted through vocal cords under direct vision,  positive ETCO2 and breath sounds checked- equal and bilateral Secured at: 21 cm Tube secured with: Tape Dental Injury: Teeth and Oropharynx as per pre-operative assessment

## 2014-10-14 NOTE — Interval H&P Note (Signed)
History and Physical Interval Note:  10/14/2014 12:46 PM  Paul Brady  has presented today for surgery, with the diagnosis of Right and Left Inguinal Hernias  The various methods of treatment have been discussed with the patient and family. After consideration of risks, benefits and other options for treatment, the patient has consented to  Procedure(s): LAPAROSCOPIC BILATERAL INGUINAL HERNIA REPAIRS WITH MESH (Bilateral) as a surgical intervention .  The patient's history has been reviewed, patient examined, no change in status, stable for surgery.  I have reviewed the patient's chart and labs.  Questions were answered to the patient's satisfaction.     Adren Dollins C.

## 2014-10-14 NOTE — Anesthesia Postprocedure Evaluation (Signed)
  Anesthesia Post-op Note  Patient: Paul Brady  Procedure(s) Performed: Procedure(s) (LRB): LAPAROSCOPIC BILATERAL INGUINAL HERNIA REPAIRS WITH MESH (Bilateral)  Patient Location: PACU  Anesthesia Type: General  Level of Consciousness: awake and alert   Airway and Oxygen Therapy: Patient Spontanous Breathing  Post-op Pain: mild  Post-op Assessment: Post-op Vital signs reviewed, Patient's Cardiovascular Status Stable, Respiratory Function Stable, Patent Airway and No signs of Nausea or vomiting  Last Vitals:  Filed Vitals:   10/14/14 1552  BP: 153/91  Pulse: 59  Temp: 36.9 C  Resp:     Post-op Vital Signs: stable   Complications: No apparent anesthesia complications

## 2014-10-14 NOTE — Op Note (Signed)
10/14/2014  2:47 PM  PATIENT:  Paul Brady  59 y.o. male  Patient Care Team: Nolene Ebbs, MD as PCP - General (Internal Medicine) Dixie Dials, MD as Consulting Physician (Cardiology) Michael Boston, MD as Consulting Physician (General Surgery) Patrecia Pour, NP as Nurse Practitioner (Psychiatry) Alda Berthold, DO as Consulting Physician (Neurology)  PRE-OPERATIVE DIAGNOSIS:  Right and Left Inguinal Hernias  POST-OPERATIVE DIAGNOSIS:  Right and recurrent Left Inguinal Hernias Bilateral femoral hernias  PROCEDURE:  Procedure(s): LAPAROSCOPIC BILATERAL INGUINAL HERNIA REPAIRS WITH MESH Repair bilateral femoral hernias with mesh  SURGEON:  Surgeon(s): Michael Boston, MD  ASSISTANT: RN   ANESTHESIA:   Regional ilioinguinal and genitofemoral and spermatic cord nerve blocks with GETA  EBL:     Delay start of Pharmacological VTE agent (>24hrs) due to surgical blood loss or risk of bleeding:  no  DRAINS: NONE  SPECIMEN:   NONE  DISPOSITION OF SPECIMEN:  N/A  COUNTS:  YES  PLAN OF CARE: Discharge to home after PACU  PATIENT DISPOSITION:  PACU - hemodynamically stable.  INDICATION: Patient with obvious right angle hernia.  Tenderness concerning for recurrent lifting or hernia.  I recommended laparoscopic exploration and repair  The anatomy & physiology of the abdominal wall and pelvic floor was discussed.  The pathophysiology of hernias in the inguinal and pelvic region was discussed.  Natural history risks such as progressive enlargement, pain, incarceration & strangulation was discussed.   Contributors to complications such as smoking, obesity, diabetes, prior surgery, etc were discussed.    I feel the risks of no intervention will lead to serious problems that outweigh the operative risks; therefore, I recommended surgery to reduce and repair the hernia.  I explained laparoscopic techniques with possible need for an open approach.  I noted usual use of mesh to patch and/or  buttress hernia repair  Risks such as bleeding, infection, abscess, need for further treatment, heart attack, death, and other risks were discussed.  I noted a good likelihood this will help address the problem.   Goals of post-operative recovery were discussed as well.  Possibility that this will not correct all symptoms was explained.  I stressed the importance of low-impact activity, aggressive pain control, avoiding constipation, & not pushing through pain to minimize risk of post-operative chronic pain or injury. Possibility of reherniation was discussed.  We will work to minimize complications.     An educational handout further explaining the pathology & treatment options was given as well.  Questions were answered.  The patient expresses understanding & wishes to proceed with surgery.  OR FINDINGS: Right greater than left indirect inguinal hernias.  Mild laxity at femoral canal suspicious for early femoral hernias.  No obturator or direct space hernias.  DESCRIPTION:   The patient was identified & brought into the operating room. The patient was positioned supine with arms tucked. SCDs were active during the entire case. The patient underwent general anesthesia without any difficulty.  The abdomen was prepped and draped in a sterile fashion. The patient's bladder was emptied.  A Surgical Timeout confirmed our plan.  I made a transverse incision through the inferior umbilical fold.  I made a small transverse nick through the anterior rectus fascia contralateral to the inguinal hernia side and placed a 0-vicryl stitch through the fascia.  I placed a Hasson trocar into the preperitoneal plane.  Entry was clean.  We induced carbon dioxide insufflation. Camera inspection revealed no injury.  I used a 30mm angled scope to bluntly free  the peritoneum off the infraumbilical anterior abdominal wall.  I created enough of a preperitoneal pocket to place 52mm ports into the right & left mid-abdomen into this  preperitoneal cavity.  I focused attention on the right side since that was the dominant hernia side.   I used blunt & focused sharp dissection to free the peritoneum off the flank and down to the pubic rim.  I freed the anteriolateral bladder wall off the anteriolateral pelvic wall, sparing midline attachments.   I located a swath of peritoneum going into a hernia fascial defect at the internal ring consistent with an indirect inguinal hernia.  I gradually freed the peritoneal hernia sac off safely and reduced it into the preperitoneal space.  I freed the peritoneum off the spermatic vessels & vas deferens.  I reduced skeletonized and removed a few spermatic cord lipomas as well I freed peritoneum off the retroperitoneum along the psoas muscle.   Patient had a small right femoral hernia as well.  I checked & assured hemostasis.    I turned attention on the opposite side.  I did dissection in a similar, mirror-image fashion. The patient had a smaller indirect hernia.  Small femoral hernia.  No evidence of any hernias in direct space or obturator region.    In freeing off the hernia sacs I did have a breech in the peritoneum right paramedian region.  I closed that using absorbable Vicryl stitch using laparoscopic intracorporeal suturing.  He is not to do a high ligation of the hernia sac as well.    I chose 15x15 cm sheets of ultra-lightweight polypropylene mesh (Ultrapro), one for each side.  I cut a single sigmoid-shaped slit ~6cm from a corner of each mesh.  I placed the meshes into the preperitoneal space & laid them as overlapping diamonds such that at the inferior points, a 6x6 cm corner flap rested in the true anterolateral pelvis, covering the obturator & femoral foramina.   I allowed the bladder to fall back and help tuck the corners of the mesh in.  The medial corners overlapped each other across midline cephalad to the pubic rim.   This provided >2 inch coverage around the hernia.  Because the  defects well covered and not particularly large, I did not need tacks to hold the mesh in place  I held the hernia sacs cephalad & evacuated carbon dioxide.  I closed the fascia  With absorbable suture.  I closed the skin using 4-0 monocryl stitch.  Sterile dressings were applied. The patient was extubated & arrived in the PACU in stable condition..  I had discussed postoperative care with the patient in the holding area.  Instructions are written in the chart.  I will try and reach a support/guardian as well.  Adin Hector, M.D., F.A.C.S. Gastrointestinal and Minimally Invasive Surgery [facility] [address] [place], [place] [address] [phone] Main / Paging

## 2014-10-14 NOTE — H&P (View-Only) (Signed)
Zedric Deroy 09/21/2014 11:46 AM Location: [facility] Patient #: [id number] DOB: Jan 25, 1956 Single / Language: Cleophus Molt / Race: Black or African American Male History of Present Illness Adin Hector MD; 09/21/2014 1:55 PM) Patient words: [facility].  The patient is a 59 year old male who presents with an inguinal hernia. Patient sent for surgical consultation by his primary care physician, Dr. Jeanie Cooks, for concern over a RIGHT angle hernia. Active male. History of open LEFT inguinal hernia repair in the 1980s. Has some mild intermittent groin pain and swelling there. However noticed more sharp pain and bulging/tearing in the RIGHT groin about 3 months ago. It gradually worsened. Normally he can walk a half hour day. However his groin starts to hurt within a block. Has tried occasional over-the-counter pain medications. No major relief. No problems with defecation. Has a bowel movement once or twice a day. He recalls normal colonoscopy in the past. He still smokes but is trying to back off. Recalls getting tone he may have some mildly abnormal heart sounds but no history of cardiac or stroke issues. Has had a few echocardiograms for questionable heart valve issue. No significant as noted on echocardiogram 2015. Mild grade 1 diastolic dysfunction but normal ejection fraction of 60?65 percent trivial regurgitation a mitral & tricuspid valves. The rest looked fine. Other Problems Elbert Ewings, CMA; 09/21/2014 11:46 AM) Anxiety Disorder Back Pain Bladder Problems Chest pain Depression Gastroesophageal Reflux Disease High blood pressure Inguinal Hernia Migraine Headache  Past Surgical History Elbert Ewings, CMA; 09/21/2014 11:46 AM) Open Inguinal Hernia Surgery Left. Spinal Surgery - Neck  Diagnostic Studies History Elbert Ewings, CMA; 09/21/2014 11:46 AM) Colonoscopy 1-5 years ago  Allergies Elbert Ewings, CMA; 09/21/2014 11:47 AM) Rubbie Battiest Extract *BIOLOGICALS  MISC* TraZODone HCl *ANTIDEPRESSANTS*  Medication History Elbert Ewings, CMA; 09/21/2014 11:48 AM) ALPRAZolam (0.25MG  Tablet, Oral) Active. BuPROPion HCl (75MG  Tablet, Oral) Active. Gentamicin Sulfate (0.3% Solution, Ophthalmic) Active. Gabapentin (300MG  Capsule, Oral) Active. QUEtiapine Fumarate (400MG  Tablet, Oral) Active. QUEtiapine Fumarate (25MG  Tablet, Oral) Active. TiZANidine HCl (2MG  Tablet, Oral) Active. PrednisoLONE Sodium Phosphate (1% Solution, Ophthalmic) Active. Polyethylene Glycol 3350 (Oral) Active. TraMADol HCl (50MG  Tablet, Oral) Active. Ibuprofen (600MG  Tablet, Oral as needed) Active. Medications Reconciled  Social History Elbert Ewings, [place]; 09/21/2014 11:46 AM) Alcohol use Occasional alcohol use. Caffeine use Carbonated beverages, Coffee. Illicit drug use Remotely quit drug use.  Family History Elbert Ewings, [place]; 09/21/2014 11:46 AM) Alcohol Abuse Brother, Mother. Arthritis Mother. Depression Sister. Hypertension Mother.     Review of Systems Elbert Ewings CMA; 09/21/2014 11:46 AM) General Not Present- Appetite Loss, Chills, Fatigue, Fever, Night Sweats, Weight Gain and Weight Loss. Skin Not Present- Change in Wart/Mole, Dryness, Hives, Jaundice, New Lesions, Non-Healing Wounds, Rash and Ulcer. HEENT Present- Seasonal Allergies and Wears glasses/contact lenses. Not Present- Earache, Hearing Loss, Hoarseness, Nose Bleed, Oral Ulcers, Ringing in the Ears, Sinus Pain, Sore Throat, Visual Disturbances and Yellow Eyes. Breast Not Present- Breast Mass, Breast Pain, Nipple Discharge and Skin Changes. Cardiovascular Not Present- Chest Pain, Difficulty Breathing Lying Down, Leg Cramps, Palpitations, Rapid Heart Rate, Shortness of Breath and Swelling of Extremities. Gastrointestinal Present- Change in Bowel Habits. Not Present- Abdominal Pain, Bloating, Bloody Stool, Chronic diarrhea, Constipation, Difficulty Swallowing, Excessive gas, Gets full quickly  at meals, Hemorrhoids, Indigestion, Nausea, Rectal Pain and Vomiting. Male Genitourinary Present- Change in Urinary Stream. Not Present- Blood in Urine, Frequency, Impotence, Nocturia, Painful Urination, Urgency and Urine Leakage. Musculoskeletal Present- Back Pain and Muscle Pain. Not Present- Joint Pain, Joint Stiffness, Muscle  Weakness and Swelling of Extremities. Neurological Present- Trouble walking. Not Present- Decreased Memory, Fainting, Headaches, Numbness, Seizures, Tingling, Tremor and Weakness. Psychiatric Present- Bipolar. Not Present- Anxiety, Change in Sleep Pattern, Depression, Fearful and Frequent crying. Endocrine Present- Cold Intolerance and Hot flashes. Not Present- Excessive Hunger, Hair Changes, Heat Intolerance and New Diabetes. Hematology Not Present- Easy Bruising, Excessive bleeding, Gland problems, HIV and Persistent Infections.  Vitals Elbert Ewings CMA; 09/21/2014 11:49 AM) 09/21/2014 11:49 AM Weight: 199 lb Height: 74in Body Surface Area: 2.17 m Body Mass Index: 25.55 kg/m Temp.: 98.71F(Oral)  Pulse: 81 (Regular)  BP: 134/70 (Sitting, Left Arm, Standard)     Physical Exam Adin Hector MD; 09/21/2014 12:23 PM)  General Mental Status-Alert. General Appearance-Not in acute distress, Not Sickly. Orientation-Oriented X3. Hydration-Well hydrated. Voice-Normal.  Integumentary Global Assessment Upon inspection and palpation of skin surfaces of the - Axillae: non-tender, no inflammation or ulceration, no drainage. and Distribution of scalp and body hair is normal. General Characteristics Temperature - normal warmth is noted.  Head and Neck Head-normocephalic, atraumatic with no lesions or palpable masses. Face Global Assessment - atraumatic, no absence of expression. Neck Global Assessment - no abnormal movements, no bruit auscultated on the right, no bruit auscultated on the left, no decreased range of motion,  non-tender. Trachea-midline. Thyroid Gland Characteristics - non-tender.  Eye Eyeball - Left-Extraocular movements intact, No Nystagmus. Eyeball - Right-Extraocular movements intact, No Nystagmus. Cornea - Left-No Hazy. Cornea - Right-No Hazy. Sclera/Conjunctiva - Left-No scleral icterus, No Discharge. Sclera/Conjunctiva - Right-No scleral icterus, No Discharge. Pupil - Left-Direct reaction to light normal. Pupil - Right-Direct reaction to light normal.  ENMT Ears Pinna - Left - no drainage observed, no generalized tenderness observed. Right - no drainage observed, no generalized tenderness observed. Nose and Sinuses External Inspection of the Nose - no destructive lesion observed. Inspection of the nares - Left - quiet respiration. Right - quiet respiration. Mouth and Throat Lips - Upper Lip - no fissures observed, no pallor noted. Lower Lip - no fissures observed, no pallor noted. Nasopharynx - no discharge present. Oral Cavity/Oropharynx - Tongue - no dryness observed. Oral Mucosa - no cyanosis observed. Hypopharynx - no evidence of airway distress observed.  Chest and Lung Exam Inspection Movements - Normal and Symmetrical. Accessory muscles - No use of accessory muscles in breathing. Palpation Palpation of the chest reveals - Non-tender. Auscultation Breath sounds - Normal and Clear.  Cardiovascular Auscultation Rhythm - Regular. Murmurs & Other Heart Sounds - Auscultation of the heart reveals - No Murmurs and No Systolic Clicks.  Abdomen Inspection Inspection of the abdomen reveals - No Visible peristalsis and No Abnormal pulsations. Umbilicus - No Bleeding, No Urine drainage. Palpation/Percussion Palpation and Percussion of the abdomen reveal - Soft, Non Tender, No Rebound tenderness, No Rigidity (guarding) and No Cutaneous hyperesthesia. Note: Soft and flat. Nontender nondistended and. No diastases. No umbilical hernia.   Male  Genitourinary Sexual Maturity Tanner 5 - Adult hair pattern and Adult penile size and shape. Note: Circumcised. Obvious RIGHT groin bulging consistent with reducible but very sensitive small RIGHT internal hernia. Very sensitive at LEFT external ring suspicious for small recurrent hernia. Testes mildly enlarged but smooth. No discrete masses. Cords and epididymides normal.   Peripheral Vascular Upper Extremity Inspection - Left - No Cyanotic nailbeds, Not Ischemic. Right - No Cyanotic nailbeds, Not Ischemic.  Neurologic Neurologic evaluation reveals -normal attention span and ability to concentrate, able to name objects and repeat phrases. Appropriate fund of knowledge , normal  sensation and normal coordination. Mental Status Affect - not angry, not paranoid. Cranial Nerves-Normal Bilaterally. Gait-Normal.  Neuropsychiatric Mental status exam performed with findings of-able to articulate well with normal speech/language, rate, volume and coherence, thought content normal with ability to perform basic computations and apply abstract reasoning and no evidence of hallucinations, delusions, obsessions or homicidal/suicidal ideation.  Musculoskeletal Global Assessment Spine, Ribs and Pelvis - no instability, subluxation or laxity. Right Upper Extremity - no instability, subluxation or laxity.  Lymphatic Head & Neck  General Head & Neck Lymphatics: Bilateral - Description - No Localized lymphadenopathy. Axillary  General Axillary Region: Bilateral - Description - No Localized lymphadenopathy. Femoral & Inguinal  Generalized Femoral & Inguinal Lymphatics: Left - Description - No Localized lymphadenopathy. Right - Description - No Localized lymphadenopathy.    Assessment & Plan Adin Hector MD; 09/21/2014 12:25 PM)  RIGHT INGUINAL HERNIA (550.90  K40.90) Impression: Very sensitive but reducible hernia. I think he would benefit from repair. Still pending get it done as  soon as possible.  I recommended ice and anti-inflammatories to help calm the sensitivity and hernia.  I strongly recommend he quit smoking to help with less pain and less risk of chronic nerve pain in smooth recovery. Also reduce the risk of hernia recurrence.  Current Plans Schedule for Surgery Written instructions provided Pt Education - Pamphlet Given - Laparoscopic Hernia Repair: discussed with patient and provided information. The anatomy & physiology of the abdominal wall and pelvic floor was discussed. The pathophysiology of hernias in the inguinal and pelvic region was discussed. Natural history risks such as progressive enlargement, pain, incarceration, and strangulation was discussed. Contributors to complications such as smoking, obesity, diabetes, prior surgery, etc were discussed.  I feel the risks of no intervention will lead to serious problems that outweigh the operative risks; therefore, I recommended surgery to reduce and repair the hernia. I explained laparoscopic techniques with possible need for an open approach. I noted usual use of mesh to patch and/or buttress hernia repair  Risks such as bleeding, infection, abscess, need for further treatment, heart attack, death, and other risks were discussed. I noted a good likelihood this will help address the problem. Goals of post-operative recovery were discussed as well. Possibility that this will not correct all symptoms was explained. I stressed the importance of low-impact activity, aggressive pain control, avoiding constipation, & not pushing through pain to minimize risk of post-operative chronic pain or injury. Possibility of reherniation was discussed. We will work to minimize complications.  An educational handout further explaining the pathology & treatment options was given as well. Questions were answered. The patient expresses understanding & wishes to proceed with surgery. Pt Education - [facility] Pain Control  (Jenelle Drennon) RECURRENT LEFT INGUINAL HERNIA (550.91  K40.91)  Current Plans Schedule for Surgery  Adin Hector, M.D., F.A.C.S. Gastrointestinal and Minimally Invasive Surgery [facility] [address], [place], [place] [address] [phone] Main / Paging

## 2014-10-15 DIAGNOSIS — K4090 Unilateral inguinal hernia, without obstruction or gangrene, not specified as recurrent: Secondary | ICD-10-CM | POA: Diagnosis not present

## 2014-10-15 NOTE — Progress Notes (Signed)
1 Day Post-Op  Subjective: Left leg weakness better, voiding, tol diet, pain controlled  Objective: Vital signs in last 24 hours: Temp:  [97.4 F (36.3 C)-99 F (37.2 C)] 99 F (37.2 C) (08/06 0556) Pulse Rate:  [51-80] 69 (08/06 0556) Resp:  [9-18] 18 (08/06 0556) BP: (101-162)/(57-97) 109/57 mmHg (08/06 0556) SpO2:  [97 %-100 %] 99 % (08/06 0556) Weight:  [92.704 kg (204 lb 6 oz)] 92.704 kg (204 lb 6 oz) (08/05 1023) Last BM Date: 10/14/14  Intake/Output from previous day: 08/05 0701 - 08/06 0700 In: 1316 [P.O.:360; I.V.:956] Out: 100 [Urine:100] Intake/Output this shift:    abd soft incisions clean lle weaker than right but is moving and can now bear weight on it.    Anti-infectives: Anti-infectives    Start     Dose/Rate Route Frequency Ordered Stop   10/14/14 1023  ceFAZolin (ANCEF) IVPB 2 g/50 mL premix     2 g 100 mL/hr over 30 Minutes Intravenous 60 min pre-op 10/14/14 1023 10/14/14 1325      Assessment/Plan: POD 1 lap bih repair  I think lle weakness should continue to improve.  Will get pt recs and follow up with dr gross as planned.  Could be related to ? Blocks that were done. Doubt traction injury given supine position     [facility] 10/15/2014

## 2014-10-15 NOTE — Clinical Social Work Note (Signed)
CSW met with pt regarding transportation consult  Pt and RN stated that pt has a ride home today and will not need any assistance.  CSW signing off  .Dede Query, LCSW [facility] Clinical Social Worker - Weekend Coverage cell #: [phone]

## 2014-10-15 NOTE — Progress Notes (Signed)
10/15/14 0933  PT Visit Information  Last PT Received On 10/15/14  Assistance Needed +1  History of Present Illness Pt s/p lap Bil inguinal hernia repair 10/14/14 with L LE numbness/weakness and inability to WB on same post op.  Pt reports some improvement this am.  Precautions  Precautions Fall  Restrictions  Weight Bearing Restrictions No  Home Living  Family/patient expects to be discharged to: Private residence  Living Arrangements Alone  Type of [place] to enter  Entrance Stairs-Number of Steps 1  [place] One level  [place] None  Prior Function  Level of Independence Independent  Communication  Communication No difficulties  Pain Assessment  Pain Assessment 0-10  Pain Score 4  Pain Location surgical site  Pain Descriptors / Indicators Aching;Sore  Pain Intervention(s) Limited activity within patient's tolerance;Monitored during session;Premedicated before session  Cognition  Arousal/Alertness Awake/alert  Behavior During Therapy WFL for tasks assessed/performed  Overall Cognitive Status Within Functional Limits for tasks assessed  Upper Extremity Assessment  Upper Extremity Assessment Overall WFL for tasks assessed  Lower Extremity Assessment  Lower Extremity Assessment LLE deficits/detail  LLE Deficits / Details AAROM WFL with strength 3/5 at ankle, 2+/5 quads and `2/5 hip.  Pt reports numbness more prominent proximally and decreasing distally`  Cervical / Trunk Assessment  Cervical / Trunk Assessment Normal  Bed Mobility  Overal bed mobility Modified Independent  General bed mobility comments Pt self assisting L LE with UEs  Transfers  Overall transfer level Needs assistance  Equipment used Rolling walker (2 wheeled)  Transfers Sit to/from Stand  Sit to Stand Min guard;Supervision  General transfer comment cues for saftey and use of UEs to self assist; stood from low bed and transfered to low recliner  Ambulation/Gait   Ambulation/Gait assistance Min guard;Supervision  Ambulation Distance (Feet) 150 Feet  Assistive device Rolling walker (2 wheeled)  Gait Pattern/deviations Step-to pattern;Step-through pattern;Decreased step length - right;Decreased step length - left;Shuffle  General Gait Details cues for position from RW   Stairs Yes  Stairs assistance Min assist  Stair Management No rails;Backwards;With walker;Step to pattern  Number of Stairs 1 (3 times)  General stair comments cues for sequence and foot/RW placement  PT - End of Session  Equipment Utilized During Treatment Gait belt  Activity Tolerance Patient tolerated treatment well  Patient left in chair;with call bell/phone within reach;with nursing/sitter in room  Nurse Communication Mobility status  PT Assessment  PT Therapy Diagnosis  Difficulty walking  PT Recommendation/Assessment Patent does not need any further PT services  PT Problem List Decreased strength;Decreased activity tolerance;Decreased mobility;Decreased knowledge of use of DME;Pain;Decreased safety awareness  No Skilled PT All education completed;Patient is supervision for all activity/mobility;Patient is modified independent with all activity/mobility  PT Plan  PT Frequency (ACUTE ONLY) Min 5X/week  PT Treatment/Interventions (ACUTE ONLY) DME instruction;Gait training;Stair training;Functional mobility training;Therapeutic activities;Therapeutic exercise;Patient/family education  PT Recommendation  Follow Up Recommendations No PT follow up  PT equipment Rolling walker with 5" wheels  Individuals Consulted  Consulted and Agree with Results and Recommendations Patient  Acute Rehab PT Goals  Patient Stated Goal Get back to walking - love the outdoors  PT Goal Formulation All assessment and education complete, DC therapy  PT Time Calculation  PT Start Time (ACUTE ONLY) 0933  PT Stop Time (ACUTE ONLY) 0955  PT Time Calculation (min) (ACUTE ONLY) 22 min  PT G-Codes **NOT  FOR INPATIENT CLASS**  Functional Assessment Tool Used clinical judgement  Functional Limitation Mobility: Walking and moving around  Mobility: Walking and Moving Around Current Status [phone]) CI  Mobility: Walking and Moving Around Goal Status [phone]) CI  Mobility: Walking and Moving Around Discharge Status ([id number]) CI  PT General Charges  $$ ACUTE PT VISIT 1 Procedure  PT Evaluation  $Initial PT Evaluation Tier I 1 Procedure

## 2014-10-15 NOTE — Care Management Note (Signed)
Case Management Note  Patient Details  Name: [name] MRN: [id number] Date of Birth: [birth year]  Subjective/Objective:                  Left inguinal hernia repair  Action/Plan:  DME Expected Discharge Date:  10/15/14               Expected Discharge Plan:     In-House Referral:  Clinical Social Work  Discharge planning Services  CM Consult  Post Acute Care Choice:    Choice offered to:     DME Arranged:  [place]ford Rile rolling DME Agency:  [place]:    [place] Agency:     Status of Service:  Completed, signed off  Medicare Important Message Given:    Date Medicare IM Given:    Medicare IM give by:    Date Additional Medicare IM Given:    Additional Medicare Important Message give by:     If discussed at [place] of Stay Meetings, dates discussed:    Additional Comments:  CM spoke with patient at the bedside. Patient needs a RW. [name] at [facility] notified of RW request and will deliver to patient's room. Also needs assistance with transportation home today. CSW made aware.  [name], RN 10/15/2014, 11:27 AM

## 2014-10-15 NOTE — Progress Notes (Signed)
Assessment unchanged. Pt verbalized understanding of dc instructions through teach back including follow care, medications to resume and when to call the doctor. Script x 1 given to pt as provided by MD. Discharged via wc to meet friend and awaiting vehicle at front entrance to carry home. Accompanied by NT.

## 2014-10-15 NOTE — Evaluation (Signed)
Physical Therapy Evaluation Patient Details Name: Paul Brady MRN: [id number] DOB: 1955-04-23 Today's Date: 10/15/2014   History of Present Illness  Pt s/p lap Bil inguinal hernia repair 10/14/14 with L LE numbness/weakness and inability to WB on same post op.  Pt reports some improvement this am.  Clinical Impression  Pt admitted as above and presenting with functional mobility limitations 2* post op pain and slowly resolving numbness/weakness L LE.  Pt plans dc this date and would benefit from use of RW for safety with mobility at home while L LE strength continues to resolve.    Follow Up Recommendations No PT follow up    Equipment Recommendations  Rolling walker with 5" wheels    Recommendations for Other Services       Precautions / Restrictions Precautions Precautions: Fall Restrictions Weight Bearing Restrictions: No      Mobility  Bed Mobility Overal bed mobility: Modified Independent             General bed mobility comments: Pt self assisting L LE with UEs  Transfers Overall transfer level: Needs assistance Equipment used: Rolling walker (2 wheeled) Transfers: Sit to/from Stand Sit to Stand: Min guard;Supervision         General transfer comment: cues for saftey and use of UEs to self assist; stood from low bed and transfered to low recliner  Ambulation/Gait Ambulation/Gait assistance: Min guard;Supervision Ambulation Distance (Feet): 150 Feet Assistive device: Rolling walker (2 wheeled) Gait Pattern/deviations: Step-to pattern;Step-through pattern;Decreased step length - right;Decreased step length - left;Shuffle     General Gait Details: cues for position from RW   Stairs Stairs: Yes Stairs assistance: Min assist Stair Management: No rails;Backwards;With walker;Step to pattern Number of Stairs: 1 (3 times) General stair comments: cues for sequence and foot/RW placement  Wheelchair Mobility    Modified Rankin (Stroke Patients Only)        Balance                                             Pertinent Vitals/Pain Pain Assessment: 0-10 Pain Score: 4  Pain Location: surgical site Pain Descriptors / Indicators: Aching;Sore Pain Intervention(s): Limited activity within patient's tolerance;Monitored during session;Premedicated before session    Home Living Family/patient expects to be discharged to:: Private residence Living Arrangements: Alone   Type of Home: Apartment Home Access: Stairs to enter   Technical brewer of Steps: 1 Home Layout: One level Home Equipment: None      Prior Function Level of Independence: Independent               Hand Dominance        Extremity/Trunk Assessment   Upper Extremity Assessment: Overall WFL for tasks assessed           Lower Extremity Assessment: LLE deficits/detail   LLE Deficits / Details: AAROM WFL with strength 3/5 at ankle, 2+/5 quads and `2/5 hip.  Pt reports numbness more prominent proximally and decreasing distally`  Cervical / Trunk Assessment: Normal  Communication   Communication: No difficulties  Cognition Arousal/Alertness: Awake/alert Behavior During Therapy: WFL for tasks assessed/performed Overall Cognitive Status: Within Functional Limits for tasks assessed                      General Comments      Exercises        Assessment/Plan  PT Assessment Patent does not need any further PT services  PT Diagnosis Difficulty walking   PT Problem List Decreased strength;Decreased activity tolerance;Decreased mobility;Decreased knowledge of use of DME;Pain;Decreased safety awareness  PT Treatment Interventions DME instruction;Gait training;Stair training;Functional mobility training;Therapeutic activities;Therapeutic exercise;Patient/family education   PT Goals (Current goals can be found in the Care Plan section) Acute Rehab PT Goals Patient Stated Goal: Get back to walking - love the outdoors PT Goal  Formulation: All assessment and education complete, DC therapy    Frequency Min 5X/week   Barriers to discharge        Co-evaluation               End of Session Equipment Utilized During Treatment: Gait belt Activity Tolerance: Patient tolerated treatment well Patient left: in chair;with call bell/phone within reach;with nursing/sitter in room Nurse Communication: Mobility status    Functional Assessment Tool Used: clinical judgement Functional Limitation: Mobility: Walking and moving around    Time: 8309-4076 PT Time Calculation (min) (ACUTE ONLY): 22 min   Charges:         PT G Codes:   PT G-Codes **NOT FOR INPATIENT CLASS** Functional Assessment Tool Used: clinical judgement Functional Limitation: Mobility: Walking and moving around    [facility] 10/15/2014, 11:35 AM

## 2014-10-17 ENCOUNTER — Other Ambulatory Visit: Payer: Self-pay | Admitting: Radiology

## 2014-10-17 ENCOUNTER — Encounter (HOSPITAL_COMMUNITY): Payer: Self-pay | Admitting: Surgery

## 2014-10-18 ENCOUNTER — Ambulatory Visit (HOSPITAL_COMMUNITY)
Admission: RE | Admit: 2014-10-18 | Discharge: 2014-10-18 | Disposition: A | Discharge status: Home / Self Care | Payer: Medicare HMO | Source: Ambulatory Visit | Attending: Internal Medicine | Admitting: Internal Medicine

## 2014-10-18 ENCOUNTER — Encounter (HOSPITAL_COMMUNITY): Payer: Self-pay

## 2014-10-18 DIAGNOSIS — E43 Unspecified severe protein-calorie malnutrition: Secondary | ICD-10-CM | POA: Insufficient documentation

## 2014-10-18 DIAGNOSIS — M899 Disorder of bone, unspecified: Secondary | ICD-10-CM | POA: Diagnosis present

## 2014-10-18 DIAGNOSIS — I10 Essential (primary) hypertension: Secondary | ICD-10-CM | POA: Insufficient documentation

## 2014-10-18 DIAGNOSIS — F329 Major depressive disorder, single episode, unspecified: Secondary | ICD-10-CM | POA: Diagnosis not present

## 2014-10-18 DIAGNOSIS — K4020 Bilateral inguinal hernia, without obstruction or gangrene, not specified as recurrent: Secondary | ICD-10-CM | POA: Insufficient documentation

## 2014-10-18 DIAGNOSIS — F332 Major depressive disorder, recurrent severe without psychotic features: Secondary | ICD-10-CM | POA: Diagnosis not present

## 2014-10-18 DIAGNOSIS — Z8249 Family history of ischemic heart disease and other diseases of the circulatory system: Secondary | ICD-10-CM | POA: Insufficient documentation

## 2014-10-18 DIAGNOSIS — E785 Hyperlipidemia, unspecified: Secondary | ICD-10-CM | POA: Insufficient documentation

## 2014-10-18 DIAGNOSIS — Z818 Family history of other mental and behavioral disorders: Secondary | ICD-10-CM | POA: Insufficient documentation

## 2014-10-18 DIAGNOSIS — R45850 Homicidal ideations: Secondary | ICD-10-CM | POA: Diagnosis not present

## 2014-10-18 LAB — CBC
HCT: 39.4 % (ref 39.0–52.0)
HEMOGLOBIN: 13.3 g/dL (ref 13.0–17.0)
MCH: 28.5 pg (ref 26.0–34.0)
MCHC: 33.8 g/dL (ref 30.0–36.0)
MCV: 84.5 fL (ref 78.0–100.0)
Platelets: 211 10*3/uL (ref 150–400)
RBC: 4.66 MIL/uL (ref 4.22–5.81)
RDW: 15 % (ref 11.5–15.5)
WBC: 9.4 10*3/uL (ref 4.0–10.5)

## 2014-10-18 LAB — PROTIME-INR
INR: 1.01 (ref 0.00–1.49)
Prothrombin Time: 13.5 seconds (ref 11.6–15.2)

## 2014-10-18 LAB — BONE MARROW EXAM

## 2014-10-18 LAB — APTT: aPTT: 26 seconds (ref 24–37)

## 2014-10-18 MED ORDER — MIDAZOLAM HCL 2 MG/2ML IJ SOLN
INTRAMUSCULAR | Status: AC
  Filled 2014-10-18: qty 6

## 2014-10-18 MED ORDER — FENTANYL CITRATE (PF) 100 MCG/2ML IJ SOLN
INTRAMUSCULAR | Status: AC
  Filled 2014-10-18: qty 4

## 2014-10-18 MED ORDER — MIDAZOLAM HCL 2 MG/2ML IJ SOLN
INTRAMUSCULAR | Status: AC | PRN
  Administered 2014-10-18: 2 mg via INTRAVENOUS
  Administered 2014-10-18 (×2): 1 mg via INTRAVENOUS

## 2014-10-18 MED ORDER — SODIUM CHLORIDE 0.9 % IV SOLN
INTRAVENOUS | Status: DC
  Administered 2014-10-18: 10:00:00 via INTRAVENOUS

## 2014-10-18 MED ORDER — FENTANYL CITRATE (PF) 100 MCG/2ML IJ SOLN
INTRAMUSCULAR | Status: AC | PRN
  Administered 2014-10-18: 50 ug via INTRAVENOUS

## 2014-10-18 NOTE — Procedures (Signed)
BM aspirate and core No comp/EBL 

## 2014-10-18 NOTE — Discharge Instructions (Signed)
Bone Marrow Aspiration, Bone Marrow Biopsy °Care After °Read the instructions outlined below and refer to this sheet in the next few weeks. These discharge instructions provide you with general information on caring for yourself after you leave the hospital. Your caregiver may also give you specific instructions. While your treatment has been planned according to the most current medical practices available, unavoidable complications occasionally occur. If you have any problems or questions after discharge, call your caregiver. °FINDING OUT THE RESULTS OF YOUR TEST °Not all test results are available during your visit. If your test results are not back during the visit, make an appointment with your caregiver to find out the results. Do not assume everything is normal if you have not heard from your caregiver or the medical facility. It is important for you to follow up on all of your test results.  °HOME CARE INSTRUCTIONS  °You have had sedation and may be sleepy or dizzy. Your thinking may not be as clear as usual. For the next 24 hours: °· Only take over-the-counter or prescription medicines for pain, discomfort, and or fever as directed by your caregiver. °· Do not drink alcohol. °· Do not smoke. °· Do not drive. °· Do not make important legal decisions. °· Do not operate heavy machinery. °· Do not care for small children by yourself. °· Keep your dressing clean and dry. You may replace dressing with a bandage after 24 hours. °· You may take a bath or shower after 24 hours. °· Use an ice pack for 20 minutes every 2 hours while awake for pain as needed. °SEEK MEDICAL CARE IF:  °· There is redness, swelling, or increasing pain at the biopsy site. °· There is pus coming from the biopsy site. °· There is drainage from a biopsy site lasting longer than one day. °· An unexplained oral temperature above 102° F (38.9° C) develops. °SEEK IMMEDIATE MEDICAL CARE IF:  °· You develop a rash. °· You have difficulty  breathing. °· You develop any reaction or side effects to medications given. °Document Released: 09/14/2004 Document Revised: 05/20/2011 Document Reviewed: 02/23/2008 °ExitCare® Patient Information ©2015 [employer]. This information is not intended to replace advice given to you by your health care provider. Make sure you discuss any questions you have with your health care provider. °Conscious Sedation, Adult, Care After °Refer to this sheet in the next few weeks. These instructions provide you with information on caring for yourself after your procedure. Your health care provider may also give you more specific instructions. Your treatment has been planned according to current medical practices, but problems sometimes occur. Call your health care provider if you have any problems or questions after your procedure. °WHAT TO EXPECT AFTER THE PROCEDURE  °After your procedure: °· You may feel sleepy, clumsy, and have poor balance for several hours. °· Vomiting may occur if you eat too soon after the procedure. °HOME CARE INSTRUCTIONS °· Do not participate in any activities where you could become injured for at least 24 hours. Do not: °¨ Drive. °¨ Swim. °¨ Ride a bicycle. °¨ Operate heavy machinery. °¨ Cook. °¨ Use power tools. °¨ Climb ladders. °¨ Work from a high place. °· Do not make important decisions or sign legal documents until you are improved. °· If you vomit, drink water, juice, or soup when you can drink without vomiting. Make sure you have little or no nausea before eating solid foods. °· Only take over-the-counter or prescription medicines for pain, discomfort, or fever   as directed by your health care provider.  Make sure you and your family fully understand everything about the medicines given to you, including what side effects may occur.  You should not drink alcohol, take sleeping pills, or take medicines that cause drowsiness for at least 24 hours.  If you smoke, do not smoke without  supervision.  If you are feeling better, you may resume normal activities 24 hours after you were sedated.  Keep all appointments with your health care provider. SEEK MEDICAL CARE IF:  Your skin is pale or bluish in color.  You continue to feel nauseous or vomit.  Your pain is getting worse and is not helped by medicine.  You have bleeding or swelling.  You are still sleepy or feeling clumsy after 24 hours. SEEK IMMEDIATE MEDICAL CARE IF:  You develop a rash.  You have difficulty breathing.  You develop any type of allergic problem.  You have a fever. MAKE SURE YOU:  Understand these instructions.  Will watch your condition.  Will get help right away if you are not doing well or get worse. Document Released: 12/16/2012 Document Reviewed: 12/16/2012 [facility] Patient Information 2015 [place], [place]. This information is not intended to replace advice given to you by your health care provider. Make sure you discuss any questions you have with your health care provider.

## 2014-10-18 NOTE — H&P (Signed)
HPI: Patient with lytic bone lesions seen by Dr. Julien Nordmann and scheduled for bone marrow biopsy today.   The patient has had a H&P performed within the last 30 days, all history, medications, and exam have been reviewed. The patient denies any interval changes since the H&P.  Medications: Prior to Admission medications   Medication Sig Start Date End Date Taking? Authorizing Provider  ALPRAZolam (XANAX) 0.25 MG tablet Take 0.25 mg by mouth 2 (two) times daily as needed for anxiety (anxiety).     Historical Provider, MD  amLODipine (NORVASC) 5 MG tablet Take 5 mg by mouth at bedtime.     Historical Provider, MD  amoxicillin (AMOXIL) 500 MG capsule take 1 capsule by mouth three times a day until finished 10/06/14   Historical Provider, MD  buPROPion (WELLBUTRIN) 75 MG tablet Take 75 mg by mouth at bedtime.  12/16/13   Historical Provider, MD  carvedilol (COREG) 3.125 MG tablet Take 3.125 mg by mouth at bedtime.  01/19/14   Historical Provider, MD  gabapentin (NEURONTIN) 300 MG capsule Take 1 capsule (300 mg total) by mouth at bedtime. 10/10/14   Donika Keith Rake, DO  naproxen (NAPROSYN) 500 MG tablet Take 1 tablet (500 mg total) by mouth 2 (two) times daily with a meal. 10/14/14   Michael Boston, MD  nitroGLYCERIN (NITROSTAT) 0.4 MG SL tablet Place 0.4 mg under the tongue every 5 (five) minutes as needed for chest pain (chest pain).     Historical Provider, MD  oxyCODONE (OXY IR/ROXICODONE) 5 MG immediate release tablet Take 1-2 tablets (5-10 mg total) by mouth every 4 (four) hours as needed for moderate pain, severe pain or breakthrough pain. 10/14/14   Michael Boston, MD  polyethylene glycol powder (GLYCOLAX/MIRALAX) powder Take 17 g by mouth daily as needed for moderate constipation.  08/18/14   Historical Provider, MD  QUEtiapine (SEROQUEL) 25 MG tablet Take 25 mg by mouth 2 (two) times daily. 06/03/14   Historical Provider, MD  QUEtiapine (SEROQUEL) 400 MG tablet Take 400 mg by mouth at bedtime.     Historical  Provider, MD  tamsulosin (FLOMAX) 0.4 MG CAPS capsule Take 0.4 mg by mouth every evening.  09/20/14   Historical Provider, MD  tizanidine (ZANAFLEX) 2 MG capsule Take 1 capsule (2 mg total) by mouth at bedtime as needed for muscle spasms. 10/10/14   Donika Keith Rake, DO     Vital Signs: BP 167/106 mmHg  Pulse 85  Temp(Src) 98.1 F (36.7 C) (Oral)  Resp 18  Ht _0  (1.88 m)  Wt 203 lb (92.08 kg)  BMI 26.05 kg/m2  SpO2 100%  Physical Exam  Constitutional: He is oriented to person, place, and time. No distress.  HENT:  Head: Normocephalic and atraumatic.  Neck: No tracheal deviation present.  Cardiovascular: Normal rate and regular rhythm.  Exam reveals no gallop and no friction rub.   No murmur heard. Pulmonary/Chest: Effort normal and breath sounds normal. No respiratory distress. He has no wheezes. He has no rales.  Abdominal: Soft. Bowel sounds are normal.  Neurological: He is alert and oriented to person, place, and time.  Skin: He is not diaphoretic.    Mallampati Score:  MD Evaluation Airway: WNL Heart: WNL Abdomen: WNL Chest/ Lungs: WNL ASA  Classification: 3 Mallampati/Airway Score: Two  Labs:  CBC:  Recent Labs  06/21/14 1413 06/30/14 1135 09/27/14 2230 10/04/14 1353  WBC 9.5 12.9* 8.8 9.7  HGB 13.9 14.4 13.6 14.1  HCT 41.3 42.0 40.4 42.7  PLT 240 235 223 223    COAGS: No results for input(s): INR, APTT in the last 8760 hours.  BMP:  Recent Labs  05/23/14 1049 06/21/14 1413 06/30/14 1135 09/27/14 2230 10/04/14 1352  NA 138 142 138 141 144  K 4.3 3.9 4.1 3.9 4.3  CL 105 107 105 108  --   CO2 _0 GLUCOSE 98 78 80 101* 78  BUN 12 <5* 12 13 11.6  CALCIUM 9.3 9.1 9.3 9.2 9.3  CREATININE 0.99 1.02 1.00 1.10 1.1  GFRNONAA 89* 80* 82* >60  --   GFRAA >90 >90 >90 >60  --     LIVER FUNCTION TESTS:  Recent Labs  04/10/14 1901 09/27/14 2230 10/04/14 1352  BILITOT 0.6 0.5 0.30  AST 51* 29 22  ALT _1 ALKPHOS 67 71 85    PROT 7.3 7.1 7.1  ALBUMIN 4.3 4.2 4.1    Assessment/Plan:  Lytic bone lesions concern for multiple myeloma Seen by Dr. Julien Nordmann on 10/04/14 Scheduled today for image guided bone marrow biopsy with sedation The patient has been NPO, no blood thinners taken, labs and vitals have been reviewed. Risks and Benefits discussed with the patient including, but not limited to bleeding, infection, damage to adjacent structures or low yield requiring additional tests. All of the patient's questions were answered, patient is agreeable to proceed. Consent signed and in chart.    SignedHedy Jacob 10/18/2014, 10:03 AM

## 2014-10-19 NOTE — Discharge Summary (Signed)
Physician Discharge Summary  Patient ID: Paul Brady MRN: [id number] DOB/AGE: [birth year] 59 y.o.  Admit date: 10/14/2014 Discharge date: 10/15/2014  Patient Care Team: Nolene Ebbs, MD as PCP - General (Internal Medicine) Dixie Dials, MD as Consulting Physician (Cardiology) Michael Boston, MD as Consulting Physician (General Surgery) Patrecia Pour, NP as Nurse Practitioner (Psychiatry) Alda Berthold, DO as Consulting Physician (Neurology)  Admission Diagnoses: Principal Problem:   Bilateral inguinal hernia (BIH) s/p lap repair 10/14/2014 Active Problems:   Tobacco abuse   Major depressive disorder, recurrent severe without psychotic features   Leg weakness, bilateral   Discharge Diagnoses:  Principal Problem:   Bilateral inguinal hernia (BIH) s/p lap repair 10/14/2014 Active Problems:   Tobacco abuse   Major depressive disorder, recurrent severe without psychotic features   Leg weakness, bilateral   POST-OPERATIVE DIAGNOSIS:  Right and recurrent Left Inguinal Hernias  SURGERY:  Procedure(s): LAPAROSCOPIC BILATERAL INGUINAL HERNIA REPAIRS WITH MESH  SURGEON:  Surgeon(s): Michael Boston, MD  Consults: None  Hospital Course:   The patient underwent the surgery above.  Postoperatively, the patient complained of some leg weakness.  Nursing was concerned.  Paul Brady was watched overnight.  Leg weakness improved.  Paul Brady gradually mobilized and advanced to a solid diet.  Pain and other symptoms were treated aggressively.    By the time of discharge, the patient was walking better, eating food, having flatus.  Pain was well-controlled on an oral medications.  Based on meeting discharge criteria and continuing to recover, I felt it was safe for the patient to be discharged from the hospital to further recover with close followup. Postoperative recommendations were discussed in detail.  They are written as well.   Significant Diagnostic Studies:  No results found for this or any previous  visit (from the past 72 hour(s)).  Ct Biopsy  10/18/2014   CLINICAL DATA:  Bone lesions  EXAM: CT-GUIDED BIOPSY BONE MARROW ASPIRATE AND CORE.  MEDICATIONS AND MEDICAL HISTORY: Versed four mg, Fentanyl 50 mcg.  Additional Medications: None.  ANESTHESIA/SEDATION: Moderate sedation time: 10 minutes  PROCEDURE: The procedure, risks, benefits, and alternatives were explained to the patient. Questions regarding the procedure were encouraged and answered. The patient understands and consents to the procedure.  The back was prepped with Betadine in a sterile fashion, and a sterile drape was applied covering the operative field. A sterile gown and sterile gloves were used for the procedure.  Under CT guidance, a(n) 11 gauge guide needle was advanced into the right iliac bone. Final imaging was performed.  Patient tolerated the procedure well without complication. Vital sign monitoring by nursing staff during the procedure will continue as patient is in the special procedures unit for post procedure observation.  FINDINGS: The images document guide needle placement within the right iliac bone. Post biopsy images demonstrate no hemorrhage.  COMPLICATIONS: None  IMPRESSION: Successful CT-guided bone marrow aspirate and core.   Electronically Signed   By: Marybelle Killings M.D.   On: 10/18/2014 14:49    Discharge Exam: Blood pressure 109/57, pulse 69, temperature 99 F (37.2 C), temperature source Oral, resp. rate 18, height 6\' 2"  (1.88 m), weight 92.704 kg (204 lb 6 oz), SpO2 99 %.  General: Pt awake/alert/oriented x4 in no major acute distress Eyes: PERRL, normal EOM. Sclera nonicteric Neuro: CN II-XII intact w/o focal sensory/motor deficits. Lymph: No head/neck/groin lymphadenopathy Psych:  No delerium/psychosis/paranoia HENT: Normocephalic, Mucus membranes moist.  No thrush Neck: Supple, No tracheal deviation Chest: No pain.  Good respiratory excursion.  CV:  Pulses intact.  Regular rhythm MS: Normal AROM mjr  joints.  No obvious deformity Abdomen: Soft, Nondistended.  Nontender.  No incarcerated hernias. Ext:  SCDs BLE.  No significant edema.  No cyanosis.  Mild weakness LLE while bearing weight but improved. Skin: No petechiae / purpura  Discharged Condition: good   Past Medical History  Diagnosis Date  . Depression   . Hypertension   . Hyperlipidemia   . Chest pain     Past Surgical History  Procedure Laterality Date  . Wrist fusion Right 04/2012  . Anterior cervical decomp/discectomy fusion N/A 03/24/2013    Procedure: ACDF C5 - C7 2 LEVELS;  Surgeon: Melina Schools, MD;  Location: [facility];  Service: Orthopedics;  Laterality: N/A;  . Hernia repair  1980's  . Carpal tunnel release      right   . Inguinal hernia repair Bilateral 10/14/2014    Procedure: LAPAROSCOPIC BILATERAL INGUINAL HERNIA REPAIRS WITH MESH;  Surgeon: Michael Boston, MD;  Location: [facility];  Service: General;  Laterality: Bilateral;    Social History   Social History  . Marital Status: Legally Separated    Spouse Name: N/A  . Number of Children: N/A  . Years of Education: N/A   Occupational History  . Not on file.   Social History Main Topics  . Smoking status: Current Every Day Smoker -- 0.25 packs/day for 40 years    Types: Cigarettes  . Smokeless tobacco: Never Used  . Alcohol Use: No     Comment: Occasionally  . Drug Use: No  . Sexual Activity: Not Currently   Other Topics Concern  . Not on file   Social History Narrative   Lives alone in a one story home.  Has 2 sons.    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   On disability since 2010 for bipolar depression and chronic back pain       Family History  Problem Relation Age of Onset  . Hypertension Mother     Living 36  . Other Father     Work-related injury  . Mental illness Sister   . Mental illness Sister     No current facility-administered medications for this encounter.   Current  Outpatient Prescriptions  Medication Sig Dispense Refill  . amLODipine (NORVASC) 5 MG tablet Take 5 mg by mouth at bedtime.     [name] amoxicillin (AMOXIL) 500 MG capsule take 1 capsule by mouth three times a day until finished  0  . buPROPion (WELLBUTRIN) 75 MG tablet Take 75 mg by mouth at bedtime.   0  . carvedilol (COREG) 3.125 MG tablet Take 3.125 mg by mouth at bedtime.   0  . gabapentin (NEURONTIN) 300 MG capsule Take 1 capsule (300 mg total) by mouth at bedtime. 30 capsule 11  . polyethylene glycol powder (GLYCOLAX/MIRALAX) powder Take 17 g by mouth daily as needed for moderate constipation.   0  . ALPRAZolam (XANAX) 0.25 MG tablet Take 0.25 mg by mouth 2 (two) times daily as needed for anxiety (anxiety).     . naproxen (NAPROSYN) 500 MG tablet Take 1 tablet (500 mg total) by mouth 2 (two) times daily with a meal. 40 tablet 1  . nitroGLYCERIN (NITROSTAT) 0.4 MG SL tablet Place 0.4 mg under the tongue every 5 (five) minutes as needed for chest pain (chest pain).     [name] oxyCODONE (OXY IR/ROXICODONE) 5 MG immediate  release tablet Take 1-2 tablets (5-10 mg total) by mouth every 4 (four) hours as needed for moderate pain, severe pain or breakthrough pain. 40 tablet 0  . QUEtiapine (SEROQUEL) 25 MG tablet Take 25 mg by mouth 2 (two) times daily.  0  . QUEtiapine (SEROQUEL) 400 MG tablet Take 400 mg by mouth at bedtime.     . tamsulosin (FLOMAX) 0.4 MG CAPS capsule Take 0.4 mg by mouth every evening.   0  . tizanidine (ZANAFLEX) 2 MG capsule Take 1 capsule (2 mg total) by mouth at bedtime as needed for muscle spasms. 20 capsule 11     Allergies  Allergen Reactions  . Mold Extract [Trichophyton] Other (See Comments)    Per Allergy testing - extreme mold allergy even when on foods.   . Trazodone And Nefazodone     priapism    Disposition: 01-Home or Self Care  Discharge Instructions    Call MD for:  extreme fatigue    Complete by:  As directed      Call MD for:  hives    Complete by:  As  directed      Call MD for:  persistant nausea and vomiting    Complete by:  As directed      Call MD for:  redness, tenderness, or signs of infection (pain, swelling, redness, odor or green/yellow discharge around incision site)    Complete by:  As directed      Call MD for:  severe uncontrolled pain    Complete by:  As directed      Call MD for:    Complete by:  As directed   Temperature > 101.46F     Diet - low sodium heart healthy    Complete by:  As directed      Discharge instructions    Complete by:  As directed   Please see discharge instruction sheets.  Also refer to handout given an office.  Please call our office if you have any questions or concerns [phone]     Discharge wound care:    Complete by:  As directed   If you have closed incisions, shower and bathe over these incisions with soap and water every day.  Remove all surgical dressings on postoperative day #3.  You do not need to replace dressings over the closed incisions unless you feel more comfortable with a Band-Aid covering it.   If you have an open wound that requires packing, please see wound care instructions.  In general, remove all dressings, wash wound with soap and water and then replace with saline moistened gauze.  Do the dressing change at least every day.  Please call our office [phone] if you have further questions.     Driving Restrictions    Complete by:  As directed   No driving until off narcotics and can safely swerve away without pain during an emergency     Increase activity slowly    Complete by:  As directed   Walk an hour a day.  Use 20-30 minute walks.  When you can walk 30 minutes without difficulty, it is fine to restart low impact/moderate activities such as biking, jogging, swimming, sexual activity, etc.  Eventually you can increase to unrestricted activity when not feeling pain.  If you feel pain: STOP![name]   Let pain protect you from overdoing it.  Use ice/heat & over-the-counter  pain medications to help minimize soreness.  If that is not enough, then use  your narcotic pain prescription as needed to remain active.  It is better to take extra pain medications and be more active than to stay bedridden to avoid all pain medications.     Lifting restrictions    Complete by:  As directed   Avoid heavy lifting initially.  Do not push through pain.  You have no specific weight limit - if it hurts to do, DON'T DO IT.   If you feel no pain, you are not injuring anything.  Pain will protect you from injury.  Coughing and sneezing are far more stressful to your incision than any lifting.  Avoid resuming heavy lifting / intense activity until off all narcotic pain medications.  When ready to exercise more, give yourself 2 weeks to gradually get back to full intense exercise/activity.     May shower / Bathe    Complete by:  As directed      May walk up steps    Complete by:  As directed      Sexual Activity Restrictions    Complete by:  As directed   Sexual activity as tolerated.  Do not push through pain.  Pain will protect you from injury.     Walk with assistance    Complete by:  As directed   Walk over an hour a day.  May use a walker/cane/companion to help with balance and stamina.            Medication List    TAKE these medications        ALPRAZolam 0.25 MG tablet  Commonly known as:  XANAX  Take 0.25 mg by mouth 2 (two) times daily as needed for anxiety (anxiety).     amLODipine 5 MG tablet  Commonly known as:  NORVASC  Take 5 mg by mouth at bedtime.     amoxicillin 500 MG capsule  Commonly known as:  AMOXIL  take 1 capsule by mouth three times a day until finished     buPROPion 75 MG tablet  Commonly known as:  WELLBUTRIN  Take 75 mg by mouth at bedtime.     carvedilol 3.125 MG tablet  Commonly known as:  COREG  Take 3.125 mg by mouth at bedtime.     gabapentin 300 MG capsule  Commonly known as:  NEURONTIN  Take 1 capsule (300 mg total) by mouth at  bedtime.     naproxen 500 MG tablet  Commonly known as:  NAPROSYN  Take 1 tablet (500 mg total) by mouth 2 (two) times daily with a meal.     nitroGLYCERIN 0.4 MG SL tablet  Commonly known as:  NITROSTAT  Place 0.4 mg under the tongue every 5 (five) minutes as needed for chest pain (chest pain).     oxyCODONE 5 MG immediate release tablet  Commonly known as:  Oxy IR/ROXICODONE  Take 1-2 tablets (5-10 mg total) by mouth every 4 (four) hours as needed for moderate pain, severe pain or breakthrough pain.     polyethylene glycol powder powder  Commonly known as:  GLYCOLAX/MIRALAX  Take 17 g by mouth daily as needed for moderate constipation.     QUEtiapine 25 MG tablet  Commonly known as:  SEROQUEL  Take 25 mg by mouth 2 (two) times daily.     QUEtiapine 400 MG tablet  Commonly known as:  SEROQUEL  Take 400 mg by mouth at bedtime.     tamsulosin 0.4 MG Caps capsule  Commonly known as:  FLOMAX  Take 0.4 mg by mouth every evening.     tizanidine 2 MG capsule  Commonly known as:  ZANAFLEX  Take 1 capsule (2 mg total) by mouth at bedtime as needed for muscle spasms.           Follow-up Information    Follow up with Takasha Vetere C., MD. Schedule an appointment as soon as possible for a visit in 3 weeks.   Specialty:  General Surgery   Why:  To follow up after your hospital stay   Contact information:   [place] [address] [phone]        Signed: Morton Peters, M.D., F.A.C.S. Gastrointestinal and Minimally Invasive Surgery [facility] [address] [place], [place] [address] [phone] Main / Paging   10/19/2014, 8:36 AM

## 2014-10-25 LAB — CHROMOSOME ANALYSIS, BONE MARROW

## 2014-10-27 ENCOUNTER — Telehealth: Payer: Self-pay | Admitting: Internal Medicine

## 2014-10-27 ENCOUNTER — Encounter: Payer: Self-pay | Admitting: Internal Medicine

## 2014-10-27 ENCOUNTER — Ambulatory Visit (HOSPITAL_BASED_OUTPATIENT_CLINIC_OR_DEPARTMENT_OTHER): Payer: Medicare HMO

## 2014-10-27 ENCOUNTER — Ambulatory Visit (HOSPITAL_BASED_OUTPATIENT_CLINIC_OR_DEPARTMENT_OTHER): Payer: Medicare HMO | Admitting: Internal Medicine

## 2014-10-27 VITALS — BP 144/91 | HR 91 | Temp 98.4°F | Resp 18 | Ht 74.0 in | Wt 206.8 lb

## 2014-10-27 DIAGNOSIS — Z599 Problem related to housing and economic circumstances, unspecified: Secondary | ICD-10-CM

## 2014-10-27 DIAGNOSIS — M899 Disorder of bone, unspecified: Secondary | ICD-10-CM

## 2014-10-27 LAB — COMPREHENSIVE METABOLIC PANEL (CC13)
ALBUMIN: 3.9 g/dL (ref 3.5–5.0)
ALK PHOS: 78 U/L (ref 40–150)
ALT: 12 U/L (ref 0–55)
AST: 14 U/L (ref 5–34)
Anion Gap: 10 mEq/L (ref 3–11)
BILIRUBIN TOTAL: 0.44 mg/dL (ref 0.20–1.20)
BUN: 14.6 mg/dL (ref 7.0–26.0)
CO2: 25 meq/L (ref 22–29)
CREATININE: 1 mg/dL (ref 0.7–1.3)
Calcium: 9.5 mg/dL (ref 8.4–10.4)
Chloride: 107 mEq/L (ref 98–109)
GLUCOSE: 77 mg/dL (ref 70–140)
Potassium: 3.7 mEq/L (ref 3.5–5.1)
SODIUM: 142 meq/L (ref 136–145)
TOTAL PROTEIN: 6.7 g/dL (ref 6.4–8.3)

## 2014-10-27 LAB — CBC WITH DIFFERENTIAL/PLATELET
BASO%: 0.3 % (ref 0.0–2.0)
Basophils Absolute: 0 10*3/uL (ref 0.0–0.1)
EOS ABS: 0.4 10*3/uL (ref 0.0–0.5)
EOS%: 3.8 % (ref 0.0–7.0)
HCT: 40.1 % (ref 38.4–49.9)
HEMOGLOBIN: 13.4 g/dL (ref 13.0–17.1)
LYMPH%: 25.5 % (ref 14.0–49.0)
MCH: 28.6 pg (ref 27.2–33.4)
MCHC: 33.4 g/dL (ref 32.0–36.0)
MCV: 85.7 fL (ref 79.3–98.0)
MONO#: 0.9 10*3/uL (ref 0.1–0.9)
MONO%: 9.1 % (ref 0.0–14.0)
NEUT%: 61.3 % (ref 39.0–75.0)
NEUTROS ABS: 5.9 10*3/uL (ref 1.5–6.5)
Platelets: 235 10*3/uL (ref 140–400)
RBC: 4.68 10*6/uL (ref 4.20–5.82)
RDW: 14.9 % — AB (ref 11.0–14.6)
WBC: 9.6 10*3/uL (ref 4.0–10.3)
lymph#: 2.5 10*3/uL (ref 0.9–3.3)

## 2014-10-27 NOTE — Progress Notes (Signed)
[place] [phone]   [phone]  OFFICE PROGRESS NOTE  Philis Fendt, MD [place] [address]  DIAGNOSIS: Lytic bone lesion seen on CT scan of the abdomen.  PRIOR THERAPY: None  CURRENT THERAPY: None  INTERVAL HISTORY: Paul Brady 59 y.o. male returns to the clinic today for follow-up visit. The patient is feeling fine today with no specific complaints except for sore throat after intubation for hernia surgery. The patient denied having any significant weight loss or night sweats. She denied having any chest pain, shortness of breath, cough or hemoptysis. He has no significant weight loss or night sweats. He was found on previous CT scan of the abdomen to have lytic bone lesions and the patient underwent bone marrow biopsy and aspirate as well as extensive blood work to rule out multiple myeloma. He is here today for evaluation and discussion of his biopsy and lab results.  MEDICAL HISTORY: Past Medical History  Diagnosis Date  . Depression   . Hypertension   . Hyperlipidemia   . Chest pain     ALLERGIES:  is allergic to mold extract and trazodone and nefazodone.  MEDICATIONS:  Current Outpatient Prescriptions  Medication Sig Dispense Refill  . ALPRAZolam (XANAX) 0.25 MG tablet Take 0.25 mg by mouth 2 (two) times daily as needed for anxiety (anxiety).     [name] amLODipine (NORVASC) 5 MG tablet Take 5 mg by mouth at bedtime.     [name] buPROPion (WELLBUTRIN) 75 MG tablet Take 75 mg by mouth at bedtime.   0  . carvedilol (COREG) 3.125 MG tablet Take 3.125 mg by mouth at bedtime.   0  . carvedilol (COREG) 6.25 MG tablet Take 6.25 mg by mouth at bedtime.  0  . gabapentin (NEURONTIN) 300 MG capsule Take 1 capsule (300 mg total) by mouth at bedtime. 30 capsule 11  . naproxen (NAPROSYN) 500 MG tablet Take 1 tablet (500 mg total) by mouth 2 (two) times daily with a meal. 40 tablet 1  . oxyCODONE (OXY IR/ROXICODONE) 5 MG  immediate release tablet Take 1-2 tablets (5-10 mg total) by mouth every 4 (four) hours as needed for moderate pain, severe pain or breakthrough pain. 40 tablet 0  . polyethylene glycol powder (GLYCOLAX/MIRALAX) powder Take 17 g by mouth daily as needed for moderate constipation.   0  . QUEtiapine (SEROQUEL) 25 MG tablet Take 25 mg by mouth 2 (two) times daily.  0  . QUEtiapine (SEROQUEL) 400 MG tablet Take 400 mg by mouth at bedtime.     . tamsulosin (FLOMAX) 0.4 MG CAPS capsule Take 0.4 mg by mouth every evening.   0  . tiZANidine (ZANAFLEX) 2 MG tablet Take 2 mg by mouth at bedtime.  1  . nitroGLYCERIN (NITROSTAT) 0.4 MG SL tablet Place 0.4 mg under the tongue every 5 (five) minutes as needed for chest pain (chest pain).      No current facility-administered medications for this visit.    SURGICAL HISTORY:  Past Surgical History  Procedure Laterality Date  . Wrist fusion Right 04/2012  . Anterior cervical decomp/discectomy fusion N/A 03/24/2013    Procedure: ACDF C5 - C7 2 LEVELS;  Surgeon: Melina Schools, MD;  Location: [facility];  Service: Orthopedics;  Laterality: N/A;  . Hernia repair  1980's  . Carpal tunnel release      right   . Inguinal hernia repair Bilateral 10/14/2014    Procedure: LAPAROSCOPIC BILATERAL INGUINAL HERNIA REPAIRS  WITH MESH;  Surgeon: Michael Boston, MD;  Location: [facility];  Service: General;  Laterality: Bilateral;    REVIEW OF SYSTEMS:  A comprehensive review of systems was negative except for: Ears, nose, mouth, throat, and face: positive for sore throat   PHYSICAL EXAMINATION: General appearance: alert, cooperative and no distress Head: Normocephalic, without obvious abnormality, atraumatic Neck: no adenopathy, no JVD, supple, symmetrical, trachea midline and thyroid not enlarged, symmetric, no tenderness/mass/nodules Lymph nodes: Cervical, supraclavicular, and axillary nodes normal. Resp: clear to auscultation bilaterally Back: symmetric, no curvature. ROM normal.  No CVA tenderness. Cardio: regular rate and rhythm, S1, S2 normal, no murmur, click, rub or gallop GI: soft, non-tender; bowel sounds normal; no masses,  no organomegaly Extremities: extremities normal, atraumatic, no cyanosis or edema  ECOG PERFORMANCE STATUS: 0 - Asymptomatic  Blood pressure 144/91, pulse 91, temperature 98.4 F (36.9 C), temperature source Oral, resp. rate 18, height $RemoveBe'6\' 2"'WRVhUBZDy$  (1.88 m), weight 206 lb 12.8 oz (93.804 kg), SpO2 100 %.  LABORATORY DATA: Lab Results  Component Value Date   WBC 9.4 10/18/2014   HGB 13.3 10/18/2014   HCT 39.4 10/18/2014   MCV 84.5 10/18/2014   PLT 211 10/18/2014      Chemistry      Component Value Date/Time   NA 144 10/04/2014 1352   NA 141 09/27/2014 2230   K 4.3 10/04/2014 1352   K 3.9 09/27/2014 2230   CL 108 09/27/2014 2230   CO2 28 10/04/2014 1352   CO2 26 09/27/2014 2230   BUN 11.6 10/04/2014 1352   BUN 13 09/27/2014 2230   CREATININE 1.1 10/04/2014 1352   CREATININE 1.10 09/27/2014 2230      Component Value Date/Time   CALCIUM 9.3 10/04/2014 1352   CALCIUM 9.2 09/27/2014 2230   ALKPHOS 85 10/04/2014 1352   ALKPHOS 71 09/27/2014 2230   AST 22 10/04/2014 1352   AST 29 09/27/2014 2230   ALT 26 10/04/2014 1352   ALT 30 09/27/2014 2230   BILITOT 0.30 10/04/2014 1352   BILITOT 0.5 09/27/2014 2230       RADIOGRAPHIC STUDIES: Ct Abdomen Pelvis W Contrast  09/28/2014   CLINICAL DATA:  59 year old male with right inguinal hernia and pain.  EXAM: CT ABDOMEN AND PELVIS WITH CONTRAST  TECHNIQUE: Multidetector CT imaging of the abdomen and pelvis was performed using the standard protocol following bolus administration of intravenous contrast.  CONTRAST:  78mL OMNIPAQUE IOHEXOL 300 MG/ML SOLN, 14mL OMNIPAQUE IOHEXOL 300 MG/ML SOLN  COMPARISON:  Radiograph dated 06/30/2014  FINDINGS: The visualized lung bases are clear. No intra-abdominal free air or free fluid.  The liver, gallbladder, pancreas, spleen, adrenal glands, kidneys,  visualized ureters, and urinary bladder appear unremarkable. The prostate and seminal vesicles are grossly unremarkable.  There is sigmoid diverticulosis with muscular hypertrophy. No definite active inflammation. Moderate stool noted throughout the colon. No evidence of bowel obstruction. The appendix appears unremarkable.  The visualized abdominal aorta and IVC are patent. No portal venous gas identified. There is no lymphadenopathy. Small fat containing umbilical hernia. Small fat containing right inguinal hernia. Degenerative changes of the spine. There is disc desiccation with vacuum phenomena at L3-L4. No acute fracture. Small scattered lucent lesions throughout the osseous structures concerning for malignancy such as multiple myeloma. Metastatic disease less likely but not entirely excluded. Clinical correlation is recommended.  IMPRESSION: Small fat containing right inguinal hernia. No evidence of inflammation.  Diverticulosis  Constipation. No evidence of bowel obstruction or inflammation. Normal appendix.  Innumerable osseous  lucent lesions concerning for multiple myeloma. Clinical correlation is recommended.   Electronically Signed   By: Anner Crete M.D.   On: 09/28/2014 00:52   Ct Biopsy  10/18/2014   CLINICAL DATA:  Bone lesions  EXAM: CT-GUIDED BIOPSY BONE MARROW ASPIRATE AND CORE.  MEDICATIONS AND MEDICAL HISTORY: Versed four mg, Fentanyl 50 mcg.  Additional Medications: None.  ANESTHESIA/SEDATION: Moderate sedation time: 10 minutes  PROCEDURE: The procedure, risks, benefits, and alternatives were explained to the patient. Questions regarding the procedure were encouraged and answered. The patient understands and consents to the procedure.  The back was prepped with Betadine in a sterile fashion, and a sterile drape was applied covering the operative field. A sterile gown and sterile gloves were used for the procedure.  Under CT guidance, a(n) 11 gauge guide needle was advanced into the right  iliac bone. Final imaging was performed.  Patient tolerated the procedure well without complication. Vital sign monitoring by nursing staff during the procedure will continue as patient is in the special procedures unit for post procedure observation.  FINDINGS: The images document guide needle placement within the right iliac bone. Post biopsy images demonstrate no hemorrhage.  COMPLICATIONS: None  IMPRESSION: Successful CT-guided bone marrow aspirate and core.   Electronically Signed   By: Marybelle Killings M.D.   On: 10/18/2014 14:49   Patient: MARTINE, TRAGESER Collected: 10/18/2014 Client: [facility] Accession: [id number] Received: 10/18/2014 Art Hoss DOB: January 11, 1956 Age: 71 Gender: M Reported: 10/20/2014 [address] Patient Ph: [phone] MRN #: [id number] [place], [place] [address] Visit #: [id number].[id number] Chart #: Phone: [phone] Fax: CC: Curt Bears, MD BONE MARROW REPORT FINAL DIAGNOSIS Diagnosis Bone Marrow, Aspirate,Biopsy, and Clot - NORMOCELLULAR BONE MARROW FOR AGE WITH TRILINEAGE HEMATOPOIESIS AND 3% PLASMA CELLS. - SEE COMMENT. PERIPHERAL BLOOD: - NO SIGNIFICANT MORPHOLOGIC ABNORMALITIES. Diagnosis Note The bone marrow is normocellular for age with trilineage hematopoiesis, and orderly and progressive maturation of all myeloid cell lines. The plasma cells represent 3% of all cells with an apparent polyclonal staining pattern for kappa and lambda light chains. The appearance is non-specific and not diagnostic of plasma cell dyscrasia/neoplasm. Correlation with cytogenetic studies is recommended. Susanne Greenhouse MD Pathologist, Electronic Signature (Case signed 10/20/2014)   ASSESSMENT AND PLAN: This is a very pleasant 59 years old African-American male currently evaluated for suspicious lytic lesions seen on CT scan of the abdomen. The myeloma panel performed recently showed no concerning finding for multiple myeloma. The bone marrow biopsy and aspirate is also not  conclusive for a diagnosis of multiple myeloma and it showed only 3% of plasma cells which is not specific and not diagnostic for plasma cell dyscrasia. The etiology of the lytic lesions seen on the CT scan of the abdomen is not clear. I recommended for the patient to have repeat PSA as well as CEA in addition to CT scan of the chest to rule out any lung lesions. If no concerning finding on the CT scan of the chest or the lab work, the patient will follow up with his primary care physician as previously schedule and there will be no need to come back to the cancer center unless there is any other concerning issues. The patient agreed to the current plan. He was advised to call if he has any concerning issues. The patient voices understanding of current disease status and treatment options and is in agreement with the current care plan.  All questions were answered. The patient knows to call the clinic  with any problems, questions or concerns. We can certainly see the patient much sooner if necessary.  Disclaimer: This note was dictated with voice recognition software. Similar sounding words can inadvertently be transcribed and may not be corrected upon review.       

## 2014-10-27 NOTE — Telephone Encounter (Signed)
Gave and printed avs for pt °

## 2014-10-28 LAB — PSA: PSA: 0.43 ng/mL (ref <=4.00)

## 2014-10-28 LAB — CEA: CEA: 2.5 ng/mL (ref 0.0–5.0)

## 2014-11-02 ENCOUNTER — Ambulatory Visit (HOSPITAL_COMMUNITY)
Admission: RE | Admit: 2014-11-02 | Discharge: 2014-11-02 | Disposition: A | Discharge status: Home / Self Care | Payer: Medicare HMO | Source: Ambulatory Visit | Attending: Internal Medicine | Admitting: Internal Medicine

## 2014-11-02 ENCOUNTER — Encounter (HOSPITAL_COMMUNITY): Payer: Self-pay

## 2014-11-02 DIAGNOSIS — I2510 Atherosclerotic heart disease of native coronary artery without angina pectoris: Secondary | ICD-10-CM | POA: Diagnosis not present

## 2014-11-02 DIAGNOSIS — J439 Emphysema, unspecified: Secondary | ICD-10-CM | POA: Insufficient documentation

## 2014-11-02 DIAGNOSIS — Z599 Problem related to housing and economic circumstances, unspecified: Secondary | ICD-10-CM | POA: Insufficient documentation

## 2014-11-02 DIAGNOSIS — I7090 Unspecified atherosclerosis: Secondary | ICD-10-CM | POA: Insufficient documentation

## 2014-11-02 DIAGNOSIS — M899 Disorder of bone, unspecified: Secondary | ICD-10-CM | POA: Diagnosis present

## 2014-11-02 MED ORDER — IOHEXOL 300 MG/ML  SOLN
75.0000 mL | Freq: Once | INTRAMUSCULAR | Status: AC | PRN
  Administered 2014-11-02: 75 mL via INTRAVENOUS

## 2014-11-03 ENCOUNTER — Encounter (HOSPITAL_COMMUNITY): Payer: Self-pay

## 2014-12-06 ENCOUNTER — Ambulatory Visit
Admission: RE | Admit: 2014-12-06 | Discharge: 2014-12-06 | Disposition: A | Discharge status: Home / Self Care | Payer: Medicare HMO | Source: Ambulatory Visit | Attending: Surgery | Admitting: Surgery

## 2014-12-06 ENCOUNTER — Other Ambulatory Visit: Payer: Self-pay | Admitting: Surgery

## 2014-12-06 DIAGNOSIS — M25562 Pain in left knee: Secondary | ICD-10-CM

## 2015-04-27 ENCOUNTER — Emergency Department (HOSPITAL_COMMUNITY)
Admission: EM | Admit: 2015-04-27 | Discharge: 2015-04-27 | Disposition: A | Discharge status: Home / Self Care | Payer: Medicare HMO | Attending: Emergency Medicine | Admitting: Emergency Medicine

## 2015-04-27 ENCOUNTER — Emergency Department (HOSPITAL_COMMUNITY): Payer: Medicare HMO

## 2015-04-27 ENCOUNTER — Encounter (HOSPITAL_COMMUNITY): Payer: Self-pay | Admitting: Emergency Medicine

## 2015-04-27 DIAGNOSIS — Z791 Long term (current) use of non-steroidal anti-inflammatories (NSAID): Secondary | ICD-10-CM | POA: Diagnosis not present

## 2015-04-27 DIAGNOSIS — R0789 Other chest pain: Secondary | ICD-10-CM | POA: Insufficient documentation

## 2015-04-27 DIAGNOSIS — Z8639 Personal history of other endocrine, nutritional and metabolic disease: Secondary | ICD-10-CM | POA: Diagnosis not present

## 2015-04-27 DIAGNOSIS — F17210 Nicotine dependence, cigarettes, uncomplicated: Secondary | ICD-10-CM | POA: Diagnosis not present

## 2015-04-27 DIAGNOSIS — F329 Major depressive disorder, single episode, unspecified: Secondary | ICD-10-CM | POA: Insufficient documentation

## 2015-04-27 DIAGNOSIS — R12 Heartburn: Secondary | ICD-10-CM | POA: Diagnosis not present

## 2015-04-27 DIAGNOSIS — I10 Essential (primary) hypertension: Secondary | ICD-10-CM | POA: Insufficient documentation

## 2015-04-27 DIAGNOSIS — Z79899 Other long term (current) drug therapy: Secondary | ICD-10-CM | POA: Insufficient documentation

## 2015-04-27 DIAGNOSIS — R079 Chest pain, unspecified: Secondary | ICD-10-CM | POA: Diagnosis present

## 2015-04-27 DIAGNOSIS — R0602 Shortness of breath: Secondary | ICD-10-CM | POA: Insufficient documentation

## 2015-04-27 LAB — I-STAT TROPONIN, ED: Troponin i, poc: 0 ng/mL (ref 0.00–0.08)

## 2015-04-27 LAB — BASIC METABOLIC PANEL
ANION GAP: 11 (ref 5–15)
BUN: 8 mg/dL (ref 6–20)
CHLORIDE: 104 mmol/L (ref 101–111)
CO2: 25 mmol/L (ref 22–32)
Calcium: 9.9 mg/dL (ref 8.9–10.3)
Creatinine, Ser: 1.13 mg/dL (ref 0.61–1.24)
GFR calc non Af Amer: >60 mL/min (ref >60)
Glucose, Bld: 100 mg/dL — ABNORMAL HIGH (ref 65–99)
POTASSIUM: 4 mmol/L (ref 3.5–5.1)
SODIUM: 140 mmol/L (ref 135–145)

## 2015-04-27 LAB — CBC
HCT: 43.7 % (ref 39.0–52.0)
HEMOGLOBIN: 14.9 g/dL (ref 13.0–17.0)
MCH: 29 pg (ref 26.0–34.0)
MCHC: 34.1 g/dL (ref 30.0–36.0)
MCV: 85 fL (ref 78.0–100.0)
Platelets: 266 10*3/uL (ref 150–400)
RBC: 5.14 MIL/uL (ref 4.22–5.81)
RDW: 15 % (ref 11.5–15.5)
WBC: 7.7 10*3/uL (ref 4.0–10.5)

## 2015-04-27 MED ORDER — KETOROLAC TROMETHAMINE 15 MG/ML IJ SOLN
15.0000 mg | Freq: Once | INTRAMUSCULAR | Status: DC
Start: 2015-04-27 — End: 2015-04-27

## 2015-04-27 MED ORDER — IBUPROFEN 400 MG PO TABS
600.0000 mg | ORAL_TABLET | Freq: Once | ORAL | Status: AC
  Administered 2015-04-27: 600 mg via ORAL
  Filled 2015-04-27: qty 1

## 2015-04-27 NOTE — ED Provider Notes (Signed)
I saw and evaluated the patient, reviewed the resident's note and I agree with the findings and plan.   EKG Interpretation   Date/Time:  Thursday April 27 2015 14:50:32 EST Ventricular Rate:  88 PR Interval:  138 QRS Duration: 90 QT Interval:  356 QTC Calculation: 430 R Axis:   58 Text Interpretation:  Sinus rhythm with Premature atrial complexes Right  atrial enlargement Moderate voltage criteria for LVH, may be normal  variant Junctional ST depression, probably normal Borderline ECG ST  elevation anteriorly similar to Mar 2016 Confirmed by GOLDSTON  MD, [place]  [phone]) on 04/27/2015 2:54:40 PM       60 year old male with history of hypertension and tobacco use who presents with chest pain onset over 24 hours ago. Pain has been sharp and sore like in nature, which he noticed initially while riding the bus yesterday. States that every bump in the road had made his pain worse. States that pain has been constant, not worsened or exacerbated by activity or exertion. No associating pleuritic pain , leg swelling, calf tenderness, dyspnea on exertion. Vital signs overall non-concerning on arrival. Has an unremarkable chest x-ray, and EKG without acute ischemia or infarction. Troponin 1 is negative. Low risk chest pain, with a heart score of 2. History not concerning for that of ACS given ongoing nature for > 24 hours, with negative EKG/trop.  Pain is reproducible at bedside with palpation, and seems to be more musculoskeletal related. Without tachycardia, dyspnea, hypoxia or any risk factors for PE. Atypical and no concern for dissection.  At this time, low suspicion for serious or toxic etiologies of his symptoms. Dicussed supportive care for home. Strict return and follow-up instructions reviewed. He expressed understanding of all discharge instructions and felt comfortable with the plan of care.   Forde Dandy, MD 04/27/15 [phone]

## 2015-04-27 NOTE — ED Provider Notes (Signed)
CSN: [id number]     Arrival date & time 04/27/15  1446 History   First MD Initiated Contact with Patient 04/27/15 [phone]     Chief Complaint  Patient presents with  . Chest Pain  . Shortness of Breath     (Consider location/radiation/quality/duration/timing/severity/associated sxs/prior Treatment) Patient is a 60 y.o. male presenting with chest pain.  Chest Pain Chest pain location: Left sternal. Pain quality: sharp and stabbing   Pain radiates to:  Does not radiate Pain radiates to the back: no   Pain severity:  Severe Onset quality:  Gradual Duration:  2 days Timing:  Constant Progression:  Waxing and waning Chronicity:  Recurrent (several times over last few years) Context: not at rest   Relieved by:  Nothing Worsened by:  Movement (He reported that the pain was extremely exacerbated when riding on the bus this morning and he was hitting bumps.) Ineffective treatments:  None tried Associated symptoms: heartburn and shortness of breath   Associated symptoms: no abdominal pain, no anxiety, no back pain, no cough, no diaphoresis, no dizziness, no fatigue, no fever, no headache, no lower extremity edema, no nausea, no near-syncope, no palpitations, not vomiting and no weakness   Risk factors: hypertension and smoking   Risk factors: no coronary artery disease, no diabetes mellitus, no high cholesterol, not male, not obese and no prior DVT/PE     Past Medical History  Diagnosis Date  . Depression   . Hypertension   . Hyperlipidemia   . Chest pain    Past Surgical History  Procedure Laterality Date  . Wrist fusion Right 04/2012  . Anterior cervical decomp/discectomy fusion N/A 03/24/2013    Procedure: ACDF C5 - C7 2 LEVELS;  Surgeon: Melina Schools, MD;  Location: [facility];  Service: Orthopedics;  Laterality: N/A;  . Hernia repair  1980's  . Carpal tunnel release      right   . Inguinal hernia repair Bilateral 10/14/2014    Procedure: LAPAROSCOPIC BILATERAL INGUINAL HERNIA REPAIRS  WITH MESH;  Surgeon: Michael Boston, MD;  Location: [facility];  Service: General;  Laterality: Bilateral;   Family History  Problem Relation Age of Onset  . Hypertension Mother     Living 3  . Other Father     Work-related injury  . Mental illness Sister   . Mental illness Sister    Social History  Substance Use Topics  . Smoking status: Current Every Day Smoker -- 0.25 packs/day for 40 years    Types: Cigarettes  . Smokeless tobacco: Never Used  . Alcohol Use: No     Comment: Occasionally    Review of Systems  Constitutional: Negative for fever, chills, diaphoresis, appetite change and fatigue.  HENT: Negative for congestion, ear pain, facial swelling, mouth sores and sore throat.   Eyes: Negative for visual disturbance.  Respiratory: Positive for shortness of breath. Negative for cough and chest tightness.   Cardiovascular: Positive for chest pain. Negative for palpitations and near-syncope.  Gastrointestinal: Positive for heartburn. Negative for nausea, vomiting, abdominal pain, diarrhea and blood in stool.  Endocrine: Negative for cold intolerance and heat intolerance.  Genitourinary: Negative for frequency, decreased urine volume and difficulty urinating.  Musculoskeletal: Negative for back pain and neck stiffness.  Skin: Negative for rash.  Neurological: Negative for dizziness, weakness, light-headedness and headaches.  All other systems reviewed and are negative.     Allergies  Mold extract and Trazodone and nefazodone  Home Medications   Prior to Admission medications  Medication Sig Start Date End Date Taking? Authorizing Provider  ALPRAZolam (XANAX) 0.25 MG tablet Take 0.25 mg by mouth 2 (two) times daily as needed for anxiety (anxiety).     Historical Provider, MD  amLODipine (NORVASC) 5 MG tablet Take 5 mg by mouth at bedtime.     Historical Provider, MD  buPROPion (WELLBUTRIN) 75 MG tablet Take 75 mg by mouth at bedtime.  12/16/13   Historical Provider, MD    carvedilol (COREG) 3.125 MG tablet Take 3.125 mg by mouth at bedtime.  01/19/14   Historical Provider, MD  carvedilol (COREG) 6.25 MG tablet Take 6.25 mg by mouth at bedtime. 10/12/14   Historical Provider, MD  gabapentin (NEURONTIN) 300 MG capsule Take 1 capsule (300 mg total) by mouth at bedtime. 10/10/14   Donika Keith Rake, DO  naproxen (NAPROSYN) 500 MG tablet Take 1 tablet (500 mg total) by mouth 2 (two) times daily with a meal. 10/14/14   Michael Boston, MD  nitroGLYCERIN (NITROSTAT) 0.4 MG SL tablet Place 0.4 mg under the tongue every 5 (five) minutes as needed for chest pain (chest pain).     Historical Provider, MD  oxyCODONE (OXY IR/ROXICODONE) 5 MG immediate release tablet Take 1-2 tablets (5-10 mg total) by mouth every 4 (four) hours as needed for moderate pain, severe pain or breakthrough pain. 10/14/14   Michael Boston, MD  polyethylene glycol powder (GLYCOLAX/MIRALAX) powder Take 17 g by mouth daily as needed for moderate constipation.  08/18/14   Historical Provider, MD  QUEtiapine (SEROQUEL) 25 MG tablet Take 25 mg by mouth 2 (two) times daily. 06/03/14   Historical Provider, MD  QUEtiapine (SEROQUEL) 400 MG tablet Take 400 mg by mouth at bedtime.     Historical Provider, MD  tamsulosin (FLOMAX) 0.4 MG CAPS capsule Take 0.4 mg by mouth every evening.  09/20/14   Historical Provider, MD  tiZANidine (ZANAFLEX) 2 MG tablet Take 2 mg by mouth at bedtime. 10/10/14   Historical Provider, MD   BP 155/98 mmHg  Pulse 77  Temp(Src) 98 F (36.7 C) (Oral)  Resp 18  SpO2 100% Physical Exam  Constitutional: He is oriented to person, place, and time. He appears well-nourished. No distress.  HENT:  Head: Normocephalic and atraumatic.  Right Ear: External ear normal.  Left Ear: External ear normal.  Eyes: Pupils are equal, round, and reactive to light. Right eye exhibits no discharge. Left eye exhibits no discharge. No scleral icterus.  Neck: Normal range of motion. Neck supple.  Cardiovascular: Normal rate.   Exam reveals no gallop and no friction rub.   No murmur heard. Pulmonary/Chest: Effort normal and breath sounds normal. No stridor. No respiratory distress. He has no wheezes. He has no rales. He exhibits tenderness (in the intercostal spaces shown in image.).    Abdominal: Soft. He exhibits no distension and no mass. There is no tenderness. There is no rebound and no guarding.  Musculoskeletal: He exhibits no edema or tenderness.  Neurological: He is alert and oriented to person, place, and time.  Skin: Skin is warm and dry. No rash noted. He is not diaphoretic. No erythema.    ED Course  Procedures (including critical care time) Labs Review Labs Reviewed  BASIC METABOLIC PANEL - Abnormal; Notable for the following:    Glucose, Bld 100 (*)    All other components within normal limits  CBC  I-STAT TROPOININ, ED    Imaging Review Dg Chest 2 View  04/27/2015  CLINICAL DATA:  Chest pain  for 2 days EXAM: CHEST  2 VIEW COMPARISON:  11/02/2014 FINDINGS: The heart size and mediastinal contours are within normal limits. Both lungs are clear. The visualized skeletal structures are unremarkable. Postsurgical changes in lower cervical spine are noted. IMPRESSION: No active cardiopulmonary disease. Electronically Signed   By: Inez Catalina M.D.   On: 04/27/2015 15:26   I have personally reviewed and evaluated these images and lab results as part of my medical decision-making.   EKG Interpretation   Date/Time:  Thursday April 27 2015 14:50:32 EST Ventricular Rate:  88 PR Interval:  138 QRS Duration: 90 QT Interval:  356 QTC Calculation: 430 R Axis:   58 Text Interpretation:  Sinus rhythm with Premature atrial complexes Right  atrial enlargement Moderate voltage criteria for LVH, may be normal  variant Junctional ST depression, probably normal Borderline ECG ST  elevation anteriorly similar to Mar 2016 Confirmed by GOLDSTON  MD, [place]  [phone]) on 04/27/2015 2:54:40 PM      MDM    60 year old male with a history of hypertension presents to the ED with 2 days of intermittent sharp stabbing parasternal pain. Rest of the history and exam as above.  EKG with normal sinus rhythm. Moderate LVH. J-point elevations in the anterior leads. No evidence of acute ischemia or arrhythmia or blocks. Initial troponin negative. CBC and BMP unremarkable. Chest x-ray without evidence of pneumonia, pneumothorax, pneumomediastinum, pulmonary edema, pleural effusions. Mediastinum with no abnormal contour to suggest dissection.  Presentation is not concerning for cardiac etiology at this time. Low pretest probability for pulmonary embolism. History is not classic for dissection. Given his complete reproduction with point tenderness and left upper extremity anterior adduction, his presentation is likely MSK related. Given toradol for pain treatment.  We do not feel that additional troponins are necessary at this time given the atypical presentation for ACS. He is safe for discharge with strict return precautions. Patient is to follow-up with his PCP as needed.  Patient seen in conjunction with Dr. Oleta Mouse.  Final diagnoses:  Chest wall pain         Addison Lank, MD 04/28/15 [id number]  Forde Dandy, MD 04/28/15 1351

## 2015-04-27 NOTE — Discharge Instructions (Signed)
Chest Wall Pain °Chest wall pain is pain in or around the bones and muscles of your chest. Sometimes, an injury causes this pain. Sometimes, the cause may not be known. This pain may take several weeks or longer to get better. °HOME CARE °Pay attention to any changes in your symptoms. Take these actions to help with your pain: °· Rest as told by your doctor. °· Avoid activities that cause pain. Try not to use your chest, belly (abdominal), or side muscles to lift heavy things. °· If directed, apply ice to the painful area: °¨ Put ice in a plastic bag. °¨ Place a towel between your skin and the bag. °¨ Leave the ice on for 20 minutes, 2-3 times per day. °· Take over-the-counter and prescription medicines only as told by your doctor. °· Do not use tobacco products, including cigarettes, chewing tobacco, and e-cigarettes. If you need help quitting, ask your doctor. °· Keep all follow-up visits as told by your doctor. This is important. °GET HELP IF: °· You have a fever. °· Your chest pain gets worse. °· You have new symptoms. °GET HELP RIGHT AWAY IF: °· You feel sick to your stomach (nauseous) or you throw up (vomit). °· You feel sweaty or light-headed. °· You have a cough with phlegm (sputum) or you cough up blood. °· You are short of breath. °  °This information is not intended to replace advice given to you by your health care provider. Make sure you discuss any questions you have with your health care provider. °  °Document Released: 08/14/2007 Document Revised: 11/16/2014 Document Reviewed: 05/23/2014 °Elsevier Interactive Patient Education ©2016 [employer] ° °

## 2015-04-27 NOTE — ED Notes (Signed)
Dr. Oleta Mouse MD at bedside.

## 2015-04-27 NOTE — ED Notes (Signed)
Pt sts left sided CP with burning x 2 days relieved by nitro and SOB

## 2016-01-31 ENCOUNTER — Emergency Department (HOSPITAL_COMMUNITY): Payer: Medicare HMO

## 2016-01-31 ENCOUNTER — Emergency Department (HOSPITAL_COMMUNITY)
Admission: EM | Admit: 2016-01-31 | Discharge: 2016-01-31 | Disposition: A | Discharge status: Home / Self Care | Payer: Medicare HMO | Attending: Emergency Medicine | Admitting: Emergency Medicine

## 2016-01-31 ENCOUNTER — Encounter (HOSPITAL_COMMUNITY): Payer: Self-pay | Admitting: *Deleted

## 2016-01-31 DIAGNOSIS — I10 Essential (primary) hypertension: Secondary | ICD-10-CM | POA: Diagnosis not present

## 2016-01-31 DIAGNOSIS — N50812 Left testicular pain: Secondary | ICD-10-CM | POA: Insufficient documentation

## 2016-01-31 DIAGNOSIS — F17210 Nicotine dependence, cigarettes, uncomplicated: Secondary | ICD-10-CM | POA: Diagnosis not present

## 2016-01-31 DIAGNOSIS — N50819 Testicular pain, unspecified: Secondary | ICD-10-CM

## 2016-01-31 LAB — COMPREHENSIVE METABOLIC PANEL
ALT: 10 U/L — AB (ref 17–63)
ANION GAP: 13 (ref 5–15)
AST: 16 U/L (ref 15–41)
Albumin: 4.4 g/dL (ref 3.5–5.0)
Alkaline Phosphatase: 59 U/L (ref 38–126)
BUN: 5 mg/dL — ABNORMAL LOW (ref 6–20)
CHLORIDE: 102 mmol/L (ref 101–111)
CO2: 22 mmol/L (ref 22–32)
Calcium: 9.7 mg/dL (ref 8.9–10.3)
Creatinine, Ser: 1.07 mg/dL (ref 0.61–1.24)
Glucose, Bld: 104 mg/dL — ABNORMAL HIGH (ref 65–99)
POTASSIUM: 3.5 mmol/L (ref 3.5–5.1)
SODIUM: 137 mmol/L (ref 135–145)
Total Bilirubin: 0.9 mg/dL (ref 0.3–1.2)
Total Protein: 7.5 g/dL (ref 6.5–8.1)

## 2016-01-31 LAB — URINALYSIS, ROUTINE W REFLEX MICROSCOPIC
GLUCOSE, UA: NEGATIVE mg/dL
Ketones, ur: 40 mg/dL — AB
LEUKOCYTES UA: NEGATIVE
Nitrite: NEGATIVE
PROTEIN: 30 mg/dL — AB
SPECIFIC GRAVITY, URINE: 1.027 (ref 1.005–1.030)
pH: 5.5 (ref 5.0–8.0)

## 2016-01-31 LAB — CBC
HEMATOCRIT: 42.2 % (ref 39.0–52.0)
HEMOGLOBIN: 14.4 g/dL (ref 13.0–17.0)
MCH: 29 pg (ref 26.0–34.0)
MCHC: 34.1 g/dL (ref 30.0–36.0)
MCV: 84.9 fL (ref 78.0–100.0)
Platelets: 234 10*3/uL (ref 150–400)
RBC: 4.97 MIL/uL (ref 4.22–5.81)
RDW: 15.2 % (ref 11.5–15.5)
WBC: 15.1 10*3/uL — AB (ref 4.0–10.5)

## 2016-01-31 LAB — URINE MICROSCOPIC-ADD ON

## 2016-01-31 LAB — LIPASE, BLOOD: LIPASE: 24 U/L (ref 11–51)

## 2016-01-31 MED ORDER — OXYCODONE HCL 5 MG PO TABS
5.0000 mg | ORAL_TABLET | Freq: Once | ORAL | Status: AC
  Administered 2016-01-31: 5 mg via ORAL
  Filled 2016-01-31: qty 1

## 2016-01-31 MED ORDER — ACETAMINOPHEN 500 MG PO TABS
1000.0000 mg | ORAL_TABLET | Freq: Once | ORAL | Status: AC
  Administered 2016-01-31: 1000 mg via ORAL
  Filled 2016-01-31: qty 2

## 2016-01-31 MED ORDER — IBUPROFEN 800 MG PO TABS
800.0000 mg | ORAL_TABLET | Freq: Once | ORAL | Status: AC
  Administered 2016-01-31: 800 mg via ORAL
  Filled 2016-01-31: qty 1

## 2016-01-31 NOTE — ED Provider Notes (Signed)
[facility] DEPT Provider Note   CSN: [id number] Arrival date & time: 01/31/16  1747     History   Chief Complaint Chief Complaint  Patient presents with  . Abdominal Pain  . Shoulder Pain    HPI Lonny Bartch is a 60 y.o. male.  60 yo M with a chief complaint of left testicular pain. Going on for at least the past 4 weeks. No spreading up into his abdomen. Denies vomiting denies diarrhea. Has a mildly constipated. Denies fevers or chills. Worse with movement and palpation. He is concerned that this may be a hernia pain as well as had 2 hernias repaired in the past.   The history is provided by the patient.  Testicle Pain  This is a new problem. The current episode started more than 1 week ago. The problem occurs constantly. The problem has not changed since onset.Associated symptoms include abdominal pain. Pertinent negatives include no chest pain, no headaches and no shortness of breath. Nothing aggravates the symptoms. Nothing relieves the symptoms. He has tried nothing for the symptoms. The treatment provided no relief.    Past Medical History:  Diagnosis Date  . Chest pain   . Depression   . Hyperlipidemia   . Hypertension     Patient Active Problem List   Diagnosis Date Noted  . Bilateral inguinal hernia (BIH) s/p lap repair 10/14/2014 10/14/2014  . Leg weakness, bilateral 10/14/2014  . Lytic bone lesions on xray 10/04/2014  . Occipital neuralgia of right side 06/28/2014  . Suicidal ideations 04/12/2014  . Homicidal ideations 04/12/2014  . Major depressive disorder, recurrent severe without psychotic features ([place]) 04/11/2014  . Chest pain at rest 08/26/2013  . Protein-calorie malnutrition, severe ([place]) 08/26/2013  . Cervical radicular pain 03/25/2013  . Neck pain 03/24/2013  . Chest pain 08/04/2012  . HTN (hypertension) 08/04/2012  . Hyperlipidemia 08/04/2012  . Tobacco abuse 08/04/2012    Past Surgical History:  Procedure Laterality Date  . ANTERIOR  CERVICAL DECOMP/DISCECTOMY FUSION N/A 03/24/2013   Procedure: ACDF C5 - C7 2 LEVELS;  Surgeon: Melina Schools, MD;  Location: [facility];  Service: Orthopedics;  Laterality: N/A;  . CARPAL TUNNEL RELEASE     right   . HERNIA REPAIR  1980's  . INGUINAL HERNIA REPAIR Bilateral 10/14/2014   Procedure: LAPAROSCOPIC BILATERAL INGUINAL HERNIA REPAIRS WITH MESH;  Surgeon: Michael Boston, MD;  Location: [facility];  Service: General;  Laterality: Bilateral;  . WRIST FUSION Right 04/2012       Home Medications    Prior to Admission medications   Medication Sig Start Date End Date Taking? Authorizing Provider  ALPRAZolam (XANAX) 0.25 MG tablet Take 0.25 mg by mouth 2 (two) times daily as needed for anxiety (anxiety).    Yes Historical Provider, MD  buPROPion (WELLBUTRIN) 75 MG tablet Take 75 mg by mouth at bedtime.  12/16/13  Yes Historical Provider, MD  nitroGLYCERIN (NITROSTAT) 0.4 MG SL tablet Place 0.4 mg under the tongue every 5 (five) minutes as needed for chest pain (chest pain).    Yes Historical Provider, MD  QUEtiapine (SEROQUEL) 25 MG tablet Take 25 mg by mouth 2 (two) times daily. 06/03/14  Yes Historical Provider, MD  QUEtiapine (SEROQUEL) 400 MG tablet Take 400 mg by mouth at bedtime.    Yes Historical Provider, MD  gabapentin (NEURONTIN) 300 MG capsule Take 1 capsule (300 mg total) by mouth at bedtime. Patient not taking: Reported on 01/31/2016 10/10/14   Alda Berthold, DO  naproxen (NAPROSYN)  500 MG tablet Take 1 tablet (500 mg total) by mouth 2 (two) times daily with a meal. Patient not taking: Reported on 01/31/2016 10/14/14   Michael Boston, MD  oxyCODONE (OXY IR/ROXICODONE) 5 MG immediate release tablet Take 1-2 tablets (5-10 mg total) by mouth every 4 (four) hours as needed for moderate pain, severe pain or breakthrough pain. Patient not taking: Reported on 01/31/2016 10/14/14   Michael Boston, MD    Family History Family History  Problem Relation Age of Onset  . Hypertension Mother     Living 37    . Other Father     Work-related injury  . Mental illness Sister   . Mental illness Sister     Social History Social History  Substance Use Topics  . Smoking status: Current Every Day Smoker    Packs/day: 0.25    Years: 40.00    Types: Cigarettes  . Smokeless tobacco: Never Used  . Alcohol use No     Comment: Occasionally     Allergies   Mold extract [trichophyton] and Trazodone and nefazodone   Review of Systems Review of Systems  Constitutional: Negative for chills and fever.  HENT: Negative for congestion and facial swelling.   Eyes: Negative for discharge and visual disturbance.  Respiratory: Negative for shortness of breath.   Cardiovascular: Negative for chest pain and palpitations.  Gastrointestinal: Positive for abdominal pain. Negative for diarrhea and vomiting.  Genitourinary: Positive for testicular pain. Negative for discharge and penile pain.  Musculoskeletal: Negative for arthralgias and myalgias.  Skin: Negative for color change and rash.  Neurological: Negative for tremors, syncope and headaches.  Psychiatric/Behavioral: Negative for confusion and dysphoric mood.     Physical Exam Updated Vital Signs BP 131/85   Pulse 93   Temp 97.9 F (36.6 C) (Oral)   Resp 22   Ht 6\' 2"  (1.88 m)   Wt 197 lb (89.4 kg)   SpO2 100%   BMI 25.29 kg/m   Physical Exam  Constitutional: He is oriented to person, place, and time. He appears well-developed and well-nourished.  HENT:  Head: Normocephalic and atraumatic.  Eyes: EOM are normal. Pupils are equal, round, and reactive to light.  Neck: Normal range of motion. Neck supple. No JVD present.  Cardiovascular: Normal rate and regular rhythm.  Exam reveals no gallop and no friction rub.   No murmur heard. Pulmonary/Chest: No respiratory distress. He has no wheezes.  Abdominal: He exhibits no distension and no mass. There is tenderness (Tenderness worse to the left testicle but all throughout the lower abdomen.  No noted masses. Intact cremasteric reflex. No rashes. ). There is no rebound and no guarding.  Rectal exam with no significant prostate tenderness.  Musculoskeletal: Normal range of motion.  Neurological: He is alert and oriented to person, place, and time.  Skin: No rash noted. No pallor.  Psychiatric: He has a normal mood and affect. His behavior is normal.  Nursing note and vitals reviewed.    ED Treatments / Results  Labs (all labs ordered are listed, but only abnormal results are displayed) Labs Reviewed  COMPREHENSIVE METABOLIC PANEL - Abnormal; Notable for the following:       Result Value   Glucose, Bld 104 (*)    BUN 5 (*)    ALT 10 (*)    All other components within normal limits  CBC - Abnormal; Notable for the following:    WBC 15.1 (*)    All other components within normal limits  URINALYSIS, ROUTINE W REFLEX MICROSCOPIC (NOT AT [facility]) - Abnormal; Notable for the following:    Color, Urine AMBER (*)    Hgb urine dipstick SMALL (*)    Bilirubin Urine SMALL (*)    Ketones, ur 40 (*)    Protein, ur 30 (*)    All other components within normal limits  URINE MICROSCOPIC-ADD ON - Abnormal; Notable for the following:    Squamous Epithelial / LPF 0-5 (*)    Bacteria, UA RARE (*)    Casts HYALINE CASTS (*)    All other components within normal limits  LIPASE, BLOOD    EKG  EKG Interpretation None       Radiology Dg Shoulder Right  Result Date: 01/31/2016 CLINICAL DATA:  Right shoulder pain after a lifting injury. EXAM: RIGHT SHOULDER - 2+ VIEW COMPARISON:  None. FINDINGS: There is slight arthritis of the acromioclavicular joint. Glenohumeral joint appears normal. No abnormal soft tissue calcifications. IMPRESSION: No acute abnormality.  Slight arthritis of the [facility] joint. Electronically Signed   By: Lorriane Shire M.D.   On: 01/31/2016 18:39    Procedures Procedures (including critical care time)  Medications Ordered in ED Medications  acetaminophen  (TYLENOL) tablet 1,000 mg (1,000 mg Oral Given 01/31/16 2057)  ibuprofen (ADVIL,MOTRIN) tablet 800 mg (800 mg Oral Given 01/31/16 2057)  oxyCODONE (Oxy IR/ROXICODONE) immediate release tablet 5 mg (5 mg Oral Given 01/31/16 2057)     Initial Impression / Assessment and Plan / ED Course  I have reviewed the triage vital signs and the nursing notes.  Pertinent labs & imaging results that were available during my care of the patient were reviewed by me and considered in my medical decision making (see chart for details).  Clinical Course     60 yo M With a chief complaint of left testicular pain. Going on for the past 3 or 4 weeks. Worse with movement palpation twisting. He feels that the pain is moved up into his abdomen. Denies fevers has had some subjective chills. Having some pain while bearing down feels he is mildly constipated. Patient in a hurry to get to the bus. I discussed that he may have a torsed testicle. I told him that this may cause him significant disability or infertility. Patient understood the risks and will follow with his family physician. Also given follow-up for urology.  9:16 PM:  I have discussed the diagnosis/risks/treatment options with the patient and family and believe the pt to be eligible for discharge home to follow-up with PCP, urology. We also discussed returning to the ED immediately if new or worsening sx occur. We discussed the sx which are most concerning (e.g., sudden worsening pain, fever, inability to tolerate by mouth) that necessitate immediate return. Medications administered to the patient during their visit and any new prescriptions provided to the patient are listed below.  Medications given during this visit Medications  acetaminophen (TYLENOL) tablet 1,000 mg (1,000 mg Oral Given 01/31/16 2057)  ibuprofen (ADVIL,MOTRIN) tablet 800 mg (800 mg Oral Given 01/31/16 2057)  oxyCODONE (Oxy IR/ROXICODONE) immediate release tablet 5 mg (5 mg Oral Given  01/31/16 2057)     The patient appears reasonably screen and/or stabilized for discharge and I doubt any other medical condition or other [facility] requiring further screening, evaluation, or treatment in the ED at this time prior to discharge.    Final Clinical Impressions(s) / ED Diagnoses   Final diagnoses:  Testicle pain    New Prescriptions New Prescriptions  No medications on file     Deno Etienne, DO 01/31/16 2116

## 2016-01-31 NOTE — ED Notes (Signed)
Patient transported to Ultrasound 

## 2016-01-31 NOTE — ED Triage Notes (Signed)
Pt has lower abd pain that started on Monday. Reports pelvic pressure and difficulty urinating, reports unusual color to his urine. Also having right shoulder pain after heavy lifting. No acute distress noted at triage.

## 2016-01-31 NOTE — Discharge Instructions (Signed)
Im am not sure if your testicle is twisting on itself and losing blood supply, you need an ultrasound to diagnose this.  You may have difficulty having children if this is so.  Follow up with your family doctor.

## 2016-02-07 ENCOUNTER — Emergency Department (HOSPITAL_COMMUNITY): Payer: Medicare HMO

## 2016-02-07 ENCOUNTER — Inpatient Hospital Stay (HOSPITAL_COMMUNITY)
Admission: EM | Admit: 2016-02-07 | Discharge: 2016-02-12 | DRG: 392 | Disposition: A | Discharge status: Home / Self Care | Payer: Medicare HMO | Attending: Internal Medicine | Admitting: Internal Medicine

## 2016-02-07 ENCOUNTER — Encounter (HOSPITAL_COMMUNITY): Payer: Self-pay | Admitting: Emergency Medicine

## 2016-02-07 DIAGNOSIS — F1290 Cannabis use, unspecified, uncomplicated: Secondary | ICD-10-CM | POA: Diagnosis present

## 2016-02-07 DIAGNOSIS — R748 Abnormal levels of other serum enzymes: Secondary | ICD-10-CM | POA: Diagnosis present

## 2016-02-07 DIAGNOSIS — F329 Major depressive disorder, single episode, unspecified: Secondary | ICD-10-CM | POA: Diagnosis not present

## 2016-02-07 DIAGNOSIS — Z981 Arthrodesis status: Secondary | ICD-10-CM

## 2016-02-07 DIAGNOSIS — F32A Depression, unspecified: Secondary | ICD-10-CM | POA: Diagnosis present

## 2016-02-07 DIAGNOSIS — I10 Essential (primary) hypertension: Secondary | ICD-10-CM | POA: Diagnosis present

## 2016-02-07 DIAGNOSIS — R1030 Lower abdominal pain, unspecified: Secondary | ICD-10-CM | POA: Diagnosis present

## 2016-02-07 DIAGNOSIS — R52 Pain, unspecified: Secondary | ICD-10-CM

## 2016-02-07 DIAGNOSIS — Z818 Family history of other mental and behavioral disorders: Secondary | ICD-10-CM | POA: Diagnosis not present

## 2016-02-07 DIAGNOSIS — N401 Enlarged prostate with lower urinary tract symptoms: Secondary | ICD-10-CM

## 2016-02-07 DIAGNOSIS — E785 Hyperlipidemia, unspecified: Secondary | ICD-10-CM | POA: Diagnosis present

## 2016-02-07 DIAGNOSIS — F319 Bipolar disorder, unspecified: Secondary | ICD-10-CM | POA: Diagnosis present

## 2016-02-07 DIAGNOSIS — Z8249 Family history of ischemic heart disease and other diseases of the circulatory system: Secondary | ICD-10-CM | POA: Diagnosis not present

## 2016-02-07 DIAGNOSIS — F419 Anxiety disorder, unspecified: Secondary | ICD-10-CM | POA: Diagnosis present

## 2016-02-07 DIAGNOSIS — K5720 Diverticulitis of large intestine with perforation and abscess without bleeding: Principal | ICD-10-CM | POA: Diagnosis present

## 2016-02-07 DIAGNOSIS — K5900 Constipation, unspecified: Secondary | ICD-10-CM | POA: Diagnosis present

## 2016-02-07 DIAGNOSIS — Z888 Allergy status to other drugs, medicaments and biological substances status: Secondary | ICD-10-CM | POA: Diagnosis not present

## 2016-02-07 DIAGNOSIS — Z720 Tobacco use: Secondary | ICD-10-CM | POA: Diagnosis present

## 2016-02-07 DIAGNOSIS — D72829 Elevated white blood cell count, unspecified: Secondary | ICD-10-CM | POA: Diagnosis present

## 2016-02-07 DIAGNOSIS — R338 Other retention of urine: Secondary | ICD-10-CM | POA: Diagnosis present

## 2016-02-07 DIAGNOSIS — K5732 Diverticulitis of large intestine without perforation or abscess without bleeding: Secondary | ICD-10-CM | POA: Diagnosis present

## 2016-02-07 DIAGNOSIS — R3914 Feeling of incomplete bladder emptying: Secondary | ICD-10-CM

## 2016-02-07 DIAGNOSIS — F17210 Nicotine dependence, cigarettes, uncomplicated: Secondary | ICD-10-CM | POA: Diagnosis present

## 2016-02-07 LAB — HEPATIC FUNCTION PANEL
ALBUMIN: 3.9 g/dL (ref 3.5–5.0)
ALT: 10 U/L — AB (ref 17–63)
AST: 11 U/L — AB (ref 15–41)
Alkaline Phosphatase: 50 U/L (ref 38–126)
BILIRUBIN INDIRECT: 0.4 mg/dL (ref 0.3–0.9)
Bilirubin, Direct: 0.1 mg/dL (ref 0.1–0.5)
TOTAL PROTEIN: 6.8 g/dL (ref 6.5–8.1)
Total Bilirubin: 0.5 mg/dL (ref 0.3–1.2)

## 2016-02-07 LAB — URINALYSIS, ROUTINE W REFLEX MICROSCOPIC
Bilirubin Urine: NEGATIVE
GLUCOSE, UA: NEGATIVE mg/dL
HGB URINE DIPSTICK: NEGATIVE
Ketones, ur: NEGATIVE mg/dL
LEUKOCYTES UA: NEGATIVE
Nitrite: NEGATIVE
Protein, ur: NEGATIVE mg/dL
SPECIFIC GRAVITY, URINE: 1.022 (ref 1.005–1.030)
pH: 5 (ref 5.0–8.0)

## 2016-02-07 LAB — COMPREHENSIVE METABOLIC PANEL
ALT: 11 U/L — AB (ref 17–63)
ANION GAP: 6 (ref 5–15)
AST: 12 U/L — ABNORMAL LOW (ref 15–41)
Albumin: 4.2 g/dL (ref 3.5–5.0)
Alkaline Phosphatase: 57 U/L (ref 38–126)
BUN: 9 mg/dL (ref 6–20)
CHLORIDE: 104 mmol/L (ref 101–111)
CO2: 28 mmol/L (ref 22–32)
Calcium: 9.2 mg/dL (ref 8.9–10.3)
Creatinine, Ser: 0.96 mg/dL (ref 0.61–1.24)
GFR calc non Af Amer: >60 mL/min (ref >60)
Glucose, Bld: 98 mg/dL (ref 65–99)
POTASSIUM: 4 mmol/L (ref 3.5–5.1)
SODIUM: 138 mmol/L (ref 135–145)
Total Bilirubin: 0.6 mg/dL (ref 0.3–1.2)
Total Protein: 7.4 g/dL (ref 6.5–8.1)

## 2016-02-07 LAB — CBC
HEMATOCRIT: 41.1 % (ref 39.0–52.0)
HEMOGLOBIN: 13.7 g/dL (ref 13.0–17.0)
MCH: 28.8 pg (ref 26.0–34.0)
MCHC: 33.3 g/dL (ref 30.0–36.0)
MCV: 86.3 fL (ref 78.0–100.0)
Platelets: 283 10*3/uL (ref 150–400)
RBC: 4.76 MIL/uL (ref 4.22–5.81)
RDW: 14.6 % (ref 11.5–15.5)
WBC: 12.8 10*3/uL — ABNORMAL HIGH (ref 4.0–10.5)

## 2016-02-07 LAB — TSH: TSH: 1.34 u[IU]/mL (ref 0.350–4.500)

## 2016-02-07 LAB — MAGNESIUM: Magnesium: 2 mg/dL (ref 1.7–2.4)

## 2016-02-07 LAB — LIPASE, BLOOD: LIPASE: 207 U/L — AB (ref 11–51)

## 2016-02-07 LAB — PHOSPHORUS: Phosphorus: 3 mg/dL (ref 2.5–4.6)

## 2016-02-07 MED ORDER — METOPROLOL TARTRATE 5 MG/5ML IV SOLN
2.5000 mg | Freq: Three times a day (TID) | INTRAVENOUS | Status: DC
  Administered 2016-02-07 – 2016-02-10 (×9): 2.5 mg via INTRAVENOUS
  Filled 2016-02-07 (×9): qty 5

## 2016-02-07 MED ORDER — HEPARIN SODIUM (PORCINE) 5000 UNIT/ML IJ SOLN
5000.0000 [IU] | Freq: Three times a day (TID) | INTRAMUSCULAR | Status: DC
  Administered 2016-02-07 – 2016-02-12 (×14): 5000 [IU] via SUBCUTANEOUS
  Filled 2016-02-07 (×14): qty 1

## 2016-02-07 MED ORDER — ONDANSETRON HCL 4 MG/2ML IJ SOLN: 4.0000 mg | Freq: Three times a day (TID) | INTRAMUSCULAR | Status: DC | PRN

## 2016-02-07 MED ORDER — IOPAMIDOL (ISOVUE-300) INJECTION 61%
100.0000 mL | Freq: Once | INTRAVENOUS | Status: AC | PRN
  Administered 2016-02-07: 100 mL via INTRAVENOUS

## 2016-02-07 MED ORDER — SODIUM CHLORIDE 0.9 % IJ SOLN
INTRAMUSCULAR | Status: AC
  Filled 2016-02-07: qty 50

## 2016-02-07 MED ORDER — PIPERACILLIN-TAZOBACTAM 3.375 G IVPB
3.3750 g | Freq: Four times a day (QID) | INTRAVENOUS | Status: DC
  Filled 2016-02-07: qty 50

## 2016-02-07 MED ORDER — ACETAMINOPHEN 325 MG PO TABS
650.0000 mg | ORAL_TABLET | Freq: Four times a day (QID) | ORAL | Status: DC | PRN
  Administered 2016-02-08: 650 mg via ORAL
  Filled 2016-02-07: qty 2

## 2016-02-07 MED ORDER — HYDROMORPHONE HCL 1 MG/ML IJ SOLN
0.5000 mg | Freq: Once | INTRAMUSCULAR | Status: AC
  Administered 2016-02-07: 0.5 mg via INTRAVENOUS
  Filled 2016-02-07: qty 1

## 2016-02-07 MED ORDER — IOPAMIDOL (ISOVUE-300) INJECTION 61%
INTRAVENOUS | Status: AC
  Filled 2016-02-07: qty 100

## 2016-02-07 MED ORDER — ACETAMINOPHEN 650 MG RE SUPP: 650.0000 mg | Freq: Four times a day (QID) | RECTAL | Status: DC | PRN

## 2016-02-07 MED ORDER — MORPHINE SULFATE (PF) 2 MG/ML IV SOLN
2.0000 mg | INTRAVENOUS | Status: DC | PRN
  Administered 2016-02-07 – 2016-02-11 (×18): 2 mg via INTRAVENOUS
  Filled 2016-02-07 (×20): qty 1

## 2016-02-07 MED ORDER — SODIUM CHLORIDE 0.9 % IV SOLN
INTRAVENOUS | Status: DC
  Administered 2016-02-08 – 2016-02-09 (×3): via INTRAVENOUS
  Administered 2016-02-09: 1000 mL via INTRAVENOUS
  Administered 2016-02-11: 06:00:00 via INTRAVENOUS

## 2016-02-07 MED ORDER — SODIUM CHLORIDE 0.9 % IV SOLN
INTRAVENOUS | Status: AC
  Administered 2016-02-07: 18:00:00 via INTRAVENOUS

## 2016-02-07 MED ORDER — SODIUM CHLORIDE 0.9 % IV SOLN
3.0000 g | Freq: Once | INTRAVENOUS | Status: AC
  Administered 2016-02-07: 3 g via INTRAVENOUS
  Filled 2016-02-07: qty 3

## 2016-02-07 MED ORDER — PIPERACILLIN-TAZOBACTAM 3.375 G IVPB
3.3750 g | Freq: Three times a day (TID) | INTRAVENOUS | Status: DC
  Administered 2016-02-07 – 2016-02-12 (×14): 3.375 g via INTRAVENOUS
  Filled 2016-02-07 (×15): qty 50

## 2016-02-07 MED ORDER — SODIUM CHLORIDE 0.9 % IV SOLN
3.0000 g | Freq: Three times a day (TID) | INTRAVENOUS | Status: DC
  Administered 2016-02-08: 3 g via INTRAVENOUS
  Filled 2016-02-07 (×2): qty 3

## 2016-02-07 MED ORDER — FAMOTIDINE IN NACL 20-0.9 MG/50ML-% IV SOLN
20.0000 mg | INTRAVENOUS | Status: DC
  Administered 2016-02-07 – 2016-02-09 (×3): 20 mg via INTRAVENOUS
  Filled 2016-02-07 (×3): qty 50

## 2016-02-07 MED ORDER — ONDANSETRON HCL 4 MG/2ML IJ SOLN
4.0000 mg | Freq: Once | INTRAMUSCULAR | Status: AC
  Administered 2016-02-07: 4 mg via INTRAVENOUS
  Filled 2016-02-07: qty 2

## 2016-02-07 NOTE — ED Provider Notes (Signed)
[facility] DEPT Provider Note   CSN: [id number] Arrival date & time: 02/07/16  1043     History   Chief Complaint Chief Complaint  Patient presents with  . Abdominal Pain    HPI Paul Brady is a 60 y.o. male with a past medical history of hypertension, hyperlipidemia, and depression presenting to the emergency department with 1 week of abdominal pain radiating down his testicles. Patient was seen 1 week ago at [facility] ED for testicular pain and ultrasound was performed. He was referred to urology but has not made an appointment to be seen. He states that the pain has been worsening since. The pain reminds him of his bilateral inguinal hernias, for which he has had mesh repairs in august 2017. He has tried Motrin 600 with no relief. He has not been eating or drinking very much in the last 2 days . His last bowel movement was over one week ago. He has noticed some mucus with straining and reports passing gas, including today.  He has been experiencing urinary frequency and increased suprapubic pain with urination. He stopped taking all his medications 4 days ago as he did not want to put anything in his stomach to aggravate his discomfort. He denies having taken any antipyretics today. He endorses some nausea but no vomiting, denies fever, chest pain, shortness of breath, penile discharge, hematuria, flank pain.  HPI  Past Medical History:  Diagnosis Date  . Chest pain   . Depression   . Hyperlipidemia   . Hypertension     Patient Active Problem List   Diagnosis Date Noted  . Diverticulitis of colon with perforation 02/07/2016  . Bilateral inguinal hernia (BIH) s/p lap repair 10/14/2014 10/14/2014  . Leg weakness, bilateral 10/14/2014  . Lytic bone lesions on xray 10/04/2014  . Occipital neuralgia of right side 06/28/2014  . Suicidal ideations 04/12/2014  . Homicidal ideations 04/12/2014  . Major depressive disorder, recurrent severe without psychotic features ([place]) 04/11/2014    . Chest pain at rest 08/26/2013  . Protein-calorie malnutrition, severe ([place]) 08/26/2013  . Cervical radicular pain 03/25/2013  . Neck pain 03/24/2013  . Chest pain 08/04/2012  . HTN (hypertension) 08/04/2012  . Hyperlipidemia 08/04/2012  . Tobacco abuse 08/04/2012    Past Surgical History:  Procedure Laterality Date  . ANTERIOR CERVICAL DECOMP/DISCECTOMY FUSION N/A 03/24/2013   Procedure: ACDF C5 - C7 2 LEVELS;  Surgeon: Melina Schools, MD;  Location: [facility];  Service: Orthopedics;  Laterality: N/A;  . CARPAL TUNNEL RELEASE     right   . HERNIA REPAIR  1980's  . INGUINAL HERNIA REPAIR Bilateral 10/14/2014   Procedure: LAPAROSCOPIC BILATERAL INGUINAL HERNIA REPAIRS WITH MESH;  Surgeon: Michael Boston, MD;  Location: [facility];  Service: General;  Laterality: Bilateral;  . WRIST FUSION Right 04/2012       Home Medications    Prior to Admission medications   Medication Sig Start Date End Date Taking? Authorizing Provider  ALPRAZolam (XANAX) 0.25 MG tablet Take 0.25 mg by mouth 2 (two) times daily as needed for anxiety (anxiety).    Yes Historical Provider, MD  buPROPion (WELLBUTRIN) 75 MG tablet Take 75 mg by mouth at bedtime.  12/16/13  Yes Historical Provider, MD  QUEtiapine (SEROQUEL) 25 MG tablet Take 25 mg by mouth 2 (two) times daily. 06/03/14  Yes Historical Provider, MD  QUEtiapine (SEROQUEL) 400 MG tablet Take 400 mg by mouth at bedtime.    Yes Historical Provider, MD  nitroGLYCERIN (NITROSTAT) 0.4 MG SL  tablet Place 0.4 mg under the tongue every 5 (five) minutes as needed for chest pain (chest pain).     Historical Provider, MD    Family History Family History  Problem Relation Age of Onset  . Hypertension Mother     Living 19  . Other Father     Work-related injury  . Mental illness Sister   . Mental illness Sister     Social History Social History  Substance Use Topics  . Smoking status: Current Every Day Smoker    Packs/day: 0.25    Years: 40.00    Types:  Cigarettes  . Smokeless tobacco: Never Used  . Alcohol use No     Comment: Occasionally     Allergies   Mold extract [trichophyton] and Trazodone and nefazodone   Review of Systems Review of Systems  Constitutional: Positive for appetite change and chills. Negative for diaphoresis, fatigue and fever.  HENT: Negative for congestion, ear discharge, ear pain, postnasal drip, rhinorrhea, sinus pain, sore throat and trouble swallowing.   Eyes: Negative for pain and visual disturbance.  Respiratory: Negative for cough, choking, chest tightness, shortness of breath, wheezing and stridor.   Cardiovascular: Negative for chest pain, palpitations and leg swelling.  Gastrointestinal: Positive for abdominal pain, constipation and nausea. Negative for abdominal distention, anal bleeding, blood in stool, diarrhea, rectal pain and vomiting.  Genitourinary: Positive for dysuria, frequency and testicular pain. Negative for discharge, flank pain, hematuria and penile pain.  Musculoskeletal: Negative for arthralgias, back pain, gait problem, neck pain and neck stiffness.  Skin: Negative for color change, pallor, rash and wound.  Neurological: Positive for numbness. Negative for dizziness, seizures, syncope, weakness, light-headedness and headaches.       Patient reports tingling feeling in his 4th and 5th right toes for the past two months  All other systems reviewed and are negative.    Physical Exam Updated Vital Signs BP 132/84   Pulse 81   Temp 98.3 F (36.8 C) (Oral)   Resp 16   SpO2 100%   Physical Exam  Constitutional: He is oriented to person, place, and time. He appears well-developed and well-nourished. No distress.  HENT:  Head: Normocephalic and atraumatic.  Right Ear: External ear normal.  Left Ear: External ear normal.  Eyes: Conjunctivae and EOM are normal. Pupils are equal, round, and reactive to light. Right eye exhibits no discharge. Left eye exhibits no discharge.  Neck:  Normal range of motion. Neck supple.  Cardiovascular: Normal rate, regular rhythm, normal heart sounds and intact distal pulses.  Exam reveals no gallop.   No murmur heard. Pulmonary/Chest: Effort normal and breath sounds normal. No stridor. No respiratory distress. He has no wheezes. He has no rales. He exhibits no tenderness.  Abdominal: Soft. He exhibits no distension and no mass. There is tenderness. There is guarding.  Hyperactive bowel sounds. Marked tenderness on palpation of lower quadrants especially in the suprapubic region. Some tenderness in the right upper quadrant. No pulsating mass noted. No inguinal lymphadenopathy. No ecchymosis or evidence of hepatosplenomegaly.  Genitourinary: Penis normal. No penile tenderness.  Genitourinary Comments: No testicular tenderness or swelling. (GU exam was performed by Alecia Lemming PA-C)  Musculoskeletal: Normal range of motion. He exhibits no edema or deformity.  Neurological: He is alert and oriented to person, place, and time. No cranial nerve deficit or sensory deficit.  Patient had good sensation in lower extremities to dull and sharp stimulus bilaterally.  Skin: Skin is warm and dry. No rash  noted. He is not diaphoretic. No erythema. No pallor.  Psychiatric: He has a normal mood and affect. His behavior is normal.  Nursing note and vitals reviewed.    ED Treatments / Results  Labs (all labs ordered are listed, but only abnormal results are displayed) Labs Reviewed  LIPASE, BLOOD - Abnormal; Notable for the following:       Result Value   Lipase 207 (*)    All other components within normal limits  COMPREHENSIVE METABOLIC PANEL - Abnormal; Notable for the following:    AST 12 (*)    ALT 11 (*)    All other components within normal limits  CBC - Abnormal; Notable for the following:    WBC 12.8 (*)    All other components within normal limits  URINALYSIS, ROUTINE W REFLEX MICROSCOPIC (NOT AT [facility])    EKG  EKG  Interpretation None       Radiology Ct Abdomen Pelvis W Contrast  Result Date: 02/07/2016 CLINICAL DATA:  Acute lower abdominal pain. EXAM: CT ABDOMEN AND PELVIS WITH CONTRAST TECHNIQUE: Multidetector CT imaging of the abdomen and pelvis was performed using the standard protocol following bolus administration of intravenous contrast. CONTRAST:  118mL ISOVUE-300 IOPAMIDOL (ISOVUE-300) INJECTION 61% COMPARISON:  CT scan of September 27, 2014. FINDINGS: Lower chest: No acute abnormality. Hepatobiliary: No focal liver abnormality is seen. No gallstones, gallbladder wall thickening, or biliary dilatation. Pancreas: Unremarkable. No pancreatic ductal dilatation or surrounding inflammatory changes. Spleen: Normal in size without focal abnormality. Adrenals/Urinary Tract: Adrenal glands are unremarkable. Kidneys are normal, without renal calculi, focal lesion, or hydronephrosis. Bladder is unremarkable. Stomach/Bowel: The appendix appears normal. Focal diverticulitis of proximal sigmoid colon is noted with small amount of extraluminal gas suggesting perforation. There is no evidence of bowel obstruction. Vascular/Lymphatic: Aortic atherosclerosis. No enlarged abdominal or pelvic lymph nodes. Reproductive: Stable mild prostatic enlargement is noted. Other: No abdominal wall hernia or abnormality. No abdominopelvic ascites. Musculoskeletal: Stable small lucencies are noted in the lower lumbar spine and pelvis. Moderate degenerative disc disease is noted at L3-4. IMPRESSION: Aortic atherosclerosis. Stable mild prostatic enlargement. Stable small lucencies are noted in the lower lumbar spine and pelvis. These most likely benign in etiology, but myeloma cannot be excluded. Focal sigmoid diverticulitis is noted with small amount of extra luminal gas adjacent to sigmoid colon suggesting perforation. Electronically Signed   By: Marijo Conception, M.D.   On: 02/07/2016 15:43    Procedures Procedures (including critical care  time)  Medications Ordered in ED Medications  iopamidol (ISOVUE-300) 61 % injection (not administered)  sodium chloride 0.9 % injection (not administered)  iopamidol (ISOVUE-300) 61 % injection 100 mL (100 mLs Intravenous Contrast Given 02/07/16 1521)  Ampicillin-Sulbactam (UNASYN) 3 g in sodium chloride 0.9 % 100 mL IVPB (3 g Intravenous New Bag/Given 02/07/16 1627)  HYDROmorphone (DILAUDID) injection 0.5 mg (0.5 mg Intravenous Given 02/07/16 1645)  ondansetron (ZOFRAN) injection 4 mg (4 mg Intravenous Given 02/07/16 1645)     Initial Impression / Assessment and Plan / ED Course  I have reviewed the triage vital signs and the nursing notes.  Pertinent labs & imaging results that were available during my care of the patient were reviewed by me and considered in my medical decision making (see chart for details).  Clinical Course    60 year old male presenting to the ED with 1 week of lower pressure-like abdominal pain radiating down his testicles. No bowel movement for over a week but still passing gas. He was seen  1 week ago for testicular pain and ultrasound performed at [facility] ED showing small bilateral varicoceles, left epididymal cyst and good blood flow bilaterally. He was given the phone number for urology to follow up but has not made an appointment. Patient's pain has been worsening since. He does have a remote history of kidney stone which he passed on his own. On exam, patient did not have testicular tenderness or swelling or penile discharge.  Urine analysis was negative Elevated lipase at 207 Slightly elevated white count 12.8  Patient was sent for abdominal and pelvic CT with contrast which showed evidence of proximal sigmoid diverticulitis with likely perforation. Patent was given one dose Unasyn IV while in the emergency department per admitting physician and pain was well controlled.  Alecia Lemming contacted the admitting Physician to discuss patient case.  The patient  appears reasonably stabilized for admission considering the current resources, flow, and capabilities available in the ED at this time, and I doubt any other [facility] requiring further screening and/or treatment in the ED prior to admission.  Final Clinical Impressions(s) / ED Diagnoses   Final diagnoses:  Perforation of sigmoid colon due to diverticulitis    New Prescriptions New Prescriptions   No medications on file     Emeline General, Hershal Coria 02/07/16 1738    [name], MD 02/08/16 1047

## 2016-02-07 NOTE — ED Provider Notes (Signed)
4:08 PM Patient seen and evaluated in conjunction with Paul Brady.   Patient with > 1 week of groin pain, seen in ED 7 days ago, had scrotal US which was negative. Lower abdominal pain progressed over the past week and patient represents today. No fever, vomiting. No diarrhea or blood in stool.  CT shows diverticulitis with microperforation. Will admit for IV abx.   Spoke with Dr. Dyann Kief. Pt started on Unasyn. Requested that we contact surgery for consult.   4:44 PM Spoke with Dr. Excell Seltzer. Surgery to round on patient in morning, page surgery with any additional acute needs prior to that time.   BP 132/84   Pulse 81   Temp 98.3 F (36.8 C) (Oral)   Resp 16   SpO2 100%       [name], PA-C 02/07/16 1645    [name], MD 02/07/16 1722

## 2016-02-07 NOTE — ED Triage Notes (Signed)
Pt reports gl abdominal and constipation x 10 days , sts able to pass gas and "some drops" . denies emesis yet nauseated. Alert and oriented x 4.

## 2016-02-07 NOTE — Consult Note (Signed)
Reason for Consult: Diverticulitis with microperforation Referring Physician: Dr. [name]  (ED)  Nijel Flink is an 60 y.o. male.  HPI: Pt presented to the ED 01/31/16 with lower abdominal pain that started on 02/05/16. He report pelvic pressure and trouble voiding. He also had testicular pain.  After work up he was referred to urology.  Today he returns with abdominal pain and constipation for 10 days, some nausea, but no emesis. Work up in the ED shows TM 99.1, and hypertensive.  WBC is up to 12.8, but was 15.1 on his visit 01/31/16. His lipase is also elevated today at 207, it was 24 on 01/31/16 visit. UA is normal.  CT scan today shows: The appendix appears normal. Focal diverticulitis of proximal sigmoid colon is noted with small amount of extraluminal gas suggesting perforation. There is no evidence of bowel obstruction.  He is admitted with diverticulitis with microperforation.  We are ask to see  Past Medical History:  Diagnosis Date  . Chest pain   . Hx of Major depressive disorder, recurrent severe without psychotic features 04/11/14  . Hyperlipidemia    Tobacco use    Hyperlipidemia    Cervical radicular pain    . Hypertension     Past Surgical History:  Procedure Laterality Date  . ANTERIOR CERVICAL DECOMP/DISCECTOMY FUSION N/A 03/24/2013   Procedure: ACDF C5 - C7 2 LEVELS;  Surgeon: Melina Schools, MD;  Location: [facility];  Service: Orthopedics;  Laterality: N/A;  . CARPAL TUNNEL RELEASE     right   . HERNIA REPAIR  1980's  . INGUINAL HERNIA REPAIR Bilateral 10/14/2014   Procedure: LAPAROSCOPIC BILATERAL INGUINAL HERNIA REPAIRS WITH MESH;  Surgeon: Michael Boston, MD;  Location: [facility];  Service: General;  Laterality: Bilateral;  . WRIST FUSION Right 04/2012    Family History  Problem Relation Age of Onset  . Hypertension Mother     Living 25  . Other Father     Work-related injury  . Mental illness Sister   . Mental illness Sister     Social History:  reports that he  has been smoking Cigarettes.  He has a 10.00 pack-year smoking history. He has never used smokeless tobacco. He reports that he does not drink alcohol or use drugs. Tobacco and some Marijuana use  Allergies:  Allergies  Allergen Reactions  . Mold Extract [Trichophyton] Other (See Comments)    Per Allergy testing - extreme mold allergy even when on foods.   . Trazodone And Nefazodone     priapism    Medications:  Prior to Admission:  Prescriptions Prior to Admission  Medication Sig Dispense Refill Last Dose  . ALPRAZolam (XANAX) 0.25 MG tablet Take 0.25 mg by mouth 2 (two) times daily as needed for anxiety (anxiety).    Past Week at Unknown time  . buPROPion (WELLBUTRIN) 75 MG tablet Take 75 mg by mouth at bedtime.   0 Past Week at Unknown time  . QUEtiapine (SEROQUEL) 25 MG tablet Take 25 mg by mouth 2 (two) times daily.  0 02/06/2016 at Unknown time  . QUEtiapine (SEROQUEL) 400 MG tablet Take 400 mg by mouth at bedtime.    02/06/2016 at Unknown time  . nitroGLYCERIN (NITROSTAT) 0.4 MG SL tablet Place 0.4 mg under the tongue every 5 (five) minutes as needed for chest pain (chest pain).    Not Taking at Unknown time   Scheduled: . sodium chloride   Intravenous STAT  . iopamidol      .  sodium chloride       Continuous:  Anti-infectives    Start     Dose/Rate Route Frequency Ordered Stop   02/07/16 1630  Ampicillin-Sulbactam (UNASYN) 3 g in sodium chloride 0.9 % 100 mL IVPB     3 g 200 mL/hr over 30 Minutes Intravenous  Once 02/07/16 1617 02/07/16 1744   02/07/16 1615  piperacillin-tazobactam (ZOSYN) IVPB 3.375 g  Status:  Discontinued     3.375 g 12.5 mL/hr over 240 Minutes Intravenous Every 6 hours 02/07/16 1603 02/07/16 1617      Results for orders placed or performed during the hospital encounter of 02/07/16 (from the past 48 hour(s))  Lipase, blood     Status: Abnormal   Collection Time: 02/07/16 11:47 AM  Result Value Ref Range   Lipase 207 (H) 11 - 51 U/L   Comprehensive metabolic panel     Status: Abnormal   Collection Time: 02/07/16 11:47 AM  Result Value Ref Range   Sodium 138 135 - 145 mmol/L   Potassium 4.0 3.5 - 5.1 mmol/L   Chloride 104 101 - 111 mmol/L   CO2 28 22 - 32 mmol/L   Glucose, Bld 98 65 - 99 mg/dL   BUN 9 6 - 20 mg/dL   Creatinine, Ser 0.96 0.61 - 1.24 mg/dL   Calcium 9.2 8.9 - 10.3 mg/dL   Total Protein 7.4 6.5 - 8.1 g/dL   Albumin 4.2 3.5 - 5.0 g/dL   AST 12 (L) 15 - 41 U/L   ALT 11 (L) 17 - 63 U/L   Alkaline Phosphatase 57 38 - 126 U/L   Total Bilirubin 0.6 0.3 - 1.2 mg/dL   GFR calc non Af Amer >60 >60 mL/min   GFR calc Af Amer >60 >60 mL/min    Comment: (NOTE) The eGFR has been calculated using the CKD EPI equation. This calculation has not been validated in all clinical situations. eGFR's persistently <60 mL/min signify possible Chronic Kidney Disease.    Anion gap 6 5 - 15  CBC     Status: Abnormal   Collection Time: 02/07/16 11:47 AM  Result Value Ref Range   WBC 12.8 (H) 4.0 - 10.5 K/uL   RBC 4.76 4.22 - 5.81 MIL/uL   Hemoglobin 13.7 13.0 - 17.0 g/dL   HCT 41.1 39.0 - 52.0 %   MCV 86.3 78.0 - 100.0 fL   MCH 28.8 26.0 - 34.0 pg   MCHC 33.3 30.0 - 36.0 g/dL   RDW 14.6 11.5 - 15.5 %   Platelets 283 150 - 400 K/uL  Urinalysis, Routine w reflex microscopic     Status: None   Collection Time: 02/07/16  1:04 PM  Result Value Ref Range   Color, Urine YELLOW YELLOW   APPearance CLEAR CLEAR   Specific Gravity, Urine 1.022 1.005 - 1.030   pH 5.0 5.0 - 8.0   Glucose, UA NEGATIVE NEGATIVE mg/dL   Hgb urine dipstick NEGATIVE NEGATIVE   Bilirubin Urine NEGATIVE NEGATIVE   Ketones, ur NEGATIVE NEGATIVE mg/dL   Protein, ur NEGATIVE NEGATIVE mg/dL   Nitrite NEGATIVE NEGATIVE   Leukocytes, UA NEGATIVE NEGATIVE    Comment: MICROSCOPIC NOT DONE ON URINES WITH NEGATIVE PROTEIN, BLOOD, LEUKOCYTES, NITRITE, OR GLUCOSE <1000 mg/dL.    Ct Abdomen Pelvis W Contrast  Result Date: 02/07/2016 CLINICAL DATA:   Acute lower abdominal pain. EXAM: CT ABDOMEN AND PELVIS WITH CONTRAST TECHNIQUE: Multidetector CT imaging of the abdomen and pelvis was performed using the standard protocol following  bolus administration of intravenous contrast. CONTRAST:  151m ISOVUE-300 IOPAMIDOL (ISOVUE-300) INJECTION 61% COMPARISON:  CT scan of September 27, 2014. FINDINGS: Lower chest: No acute abnormality. Hepatobiliary: No focal liver abnormality is seen. No gallstones, gallbladder wall thickening, or biliary dilatation. Pancreas: Unremarkable. No pancreatic ductal dilatation or surrounding inflammatory changes. Spleen: Normal in size without focal abnormality. Adrenals/Urinary Tract: Adrenal glands are unremarkable. Kidneys are normal, without renal calculi, focal lesion, or hydronephrosis. Bladder is unremarkable. Stomach/Bowel: The appendix appears normal. Focal diverticulitis of proximal sigmoid colon is noted with small amount of extraluminal gas suggesting perforation. There is no evidence of bowel obstruction. Vascular/Lymphatic: Aortic atherosclerosis. No enlarged abdominal or pelvic lymph nodes. Reproductive: Stable mild prostatic enlargement is noted. Other: No abdominal wall hernia or abnormality. No abdominopelvic ascites. Musculoskeletal: Stable small lucencies are noted in the lower lumbar spine and pelvis. Moderate degenerative disc disease is noted at L3-4. IMPRESSION: Aortic atherosclerosis. Stable mild prostatic enlargement. Stable small lucencies are noted in the lower lumbar spine and pelvis. These most likely benign in etiology, but myeloma cannot be excluded. Focal sigmoid diverticulitis is noted with small amount of extra luminal gas adjacent to sigmoid colon suggesting perforation. Electronically Signed   By: JMarijo Conception M.D.   On: 02/07/2016 15:43    Review of Systems  Constitutional: Positive for weight loss (not sure how much, not eaten in 2 days).  HENT: Negative.   Eyes: Negative.   Respiratory: Negative.    Cardiovascular: Positive for chest pain (has had cardiac work up for this and attributes it ot GERD). Negative for palpitations, orthopnea, claudication, leg swelling and PND.  Gastrointestinal: Positive for abdominal pain, constipation (chronic constipation), heartburn and nausea. Negative for vomiting.  Genitourinary:       Still has some referred pain going to his testicles  Musculoskeletal: Positive for myalgias and neck pain.  Skin: Negative.   Neurological: Negative.   Endo/Heme/Allergies: Negative.   Psychiatric/Behavioral:       Seems very stable on medicines   Blood pressure 147/92, pulse 71, temperature 98.3 F (36.8 C), temperature source Oral, resp. rate 18, SpO2 99 %. Physical Exam  Constitutional: He is oriented to person, place, and time. He appears well-developed and well-nourished. No distress.  HENT:  Head: Normocephalic and atraumatic.  Nose: Nose normal.  Eyes: Right eye exhibits no discharge. Left eye exhibits no discharge. No scleral icterus.  Neck: Normal range of motion. Neck supple. No JVD present. No tracheal deviation present. No thyromegaly present.  Cardiovascular: Normal rate, regular rhythm, normal heart sounds and intact distal pulses.   No murmur heard. Respiratory: Effort normal and breath sounds normal. No respiratory distress. He has no wheezes. He has no rales. He exhibits no tenderness.  GI: Soft. Bowel sounds are normal. He exhibits no distension and no mass. There is tenderness (lower abdomen, but not extremely tender even now.). There is no rebound and no guarding.  Scars both groins from prior inguinal hernia repairs.  Musculoskeletal: He exhibits no edema or tenderness.  Lymphadenopathy:    He has no cervical adenopathy.  Neurological: He is alert and oriented to person, place, and time. No cranial nerve deficit.  Skin: Skin is warm and dry. No rash noted. He is not diaphoretic. No erythema. No pallor.  Psychiatric: He has a normal mood and  affect. His behavior is normal. Judgment and thought content normal.    Assessment/Plan: Sigmoid diverticulitis with perforation Chronic constipaton Hx of chest pain and testicular pain Hx of major depressive  disorder with psychotic features - stable Hypertension Hyperlipidemia Tobacco use  Plan:  Conservative management with antibiotics and bowel rest.  I would put him on Rocephin and Flagyl, but that is not currently an option.  He got Unasyn in the ED.  I will put him on Zosyn tonight.  I would keep him NPO, but he is starving and not that sick so I will let him have some clears for now.   Wyllow Seigler 02/07/2016, 6:03 PM

## 2016-02-07 NOTE — Progress Notes (Signed)
Pharmacy Antibiotic Note  Paul Brady is a 60 y.o. male admitted on 02/07/2016 with diverticulitis with microperforation.  Pharmacy has been consulted for Zosyn dosing.  Plan:  Zosyn 3.375gm IV q8h (4hr extended infusions).  No further dosing adjustments anticipated, Pharmacy will sign off.  Height: 6\' 2"  (188 cm) Weight: 192 lb 1.6 oz (87.1 kg) IBW/kg (Calculated) : 82.2  Temp (24hrs), Avg:98.7 F (37.1 C), Min:98.3 F (36.8 C), Max:99.1 F (37.3 C)   Recent Labs Lab 02/07/16 1147  WBC 12.8*  CREATININE 0.96    Estimated Creatinine Clearance: 97.5 mL/min (by C-G formula based on SCr of 0.96 mg/dL).    Allergies  Allergen Reactions  . Mold Extract [Trichophyton] Other (See Comments)    Per Allergy testing - extreme mold allergy even when on foods.   . Trazodone And Nefazodone     priapism    Antimicrobials this admission: 11/29 Unasyn x 1 11/29 Zosyn >>  Dose adjustments this admission: ---  Microbiology results: None  Thank you for allowing pharmacy to be a part of this patient's care.  Peggyann Juba, PharmD, BCPS Pager: [phone] 02/07/2016 7:03 PM

## 2016-02-07 NOTE — ED Notes (Signed)
Pt stated he has testicle pain. Pt was seen at [facility] on 11/22 for testicle pain and reports having an ultrasound performed. Pt also reports having a hernia repair surgery last year.

## 2016-02-07 NOTE — ED Notes (Signed)
Pt made aware of need for urine specimen 

## 2016-02-07 NOTE — H&P (Signed)
History and Physical    Paul Brady [id number] DOB: [birth year] DOA: 02/07/2016  Referring Provider: Dr. Roderic Palau PCP: Paul Fendt, MD   Patient coming from: Home  Chief Complaint: abd pain and nausea.  HPI: Paul Brady is a 60 y.o. male with PMH significant for  HTN, HLD, bipolar disorder and tobacco abuse; Who presented to ED secondary to abd pain and nausea. Patient was seen secondary to abd pain and pelvic pressure on 01/31/16, he reported some difficulty voiding and intermittent testicular pain at that time. Work up done and referred for outpatient urology follow up. Those symptoms have since then improved. Presented now with abd pain (LLQ, cramping, with radiation across lower abdomen and with associated bloating sensation and associated Nausea/constipaiton).  Patient deneis fever, but endorses chills. Also deneis CP, SOB, vomiting, hematuria, hematochezia, melena, focal weakness or any other complaints.  ED Course: Patient with abnormal CT scan demonstrating acute diverticulitis with microperforation; elevated WBC's appreciated. Received IVF's, was started on IV antibiotics and [facility] called for admission. Surgery was also consulted.  Review of Systems:  All other systems reviewed and apart from HPI, are negative.  Past Medical History:  Diagnosis Date  . Chest pain   . Depression   . Hyperlipidemia   . Hypertension     Past Surgical History:  Procedure Laterality Date  . ANTERIOR CERVICAL DECOMP/DISCECTOMY FUSION N/A 03/24/2013   Procedure: ACDF C5 - C7 2 LEVELS;  Surgeon: Melina Schools, MD;  Location: [facility];  Service: Orthopedics;  Laterality: N/A;  . CARPAL TUNNEL RELEASE     right   . HERNIA REPAIR  1980's  . INGUINAL HERNIA REPAIR Bilateral 10/14/2014   Procedure: LAPAROSCOPIC BILATERAL INGUINAL HERNIA REPAIRS WITH MESH;  Surgeon: Michael Boston, MD;  Location: [facility];  Service: General;  Laterality: Bilateral;  . WRIST FUSION Right 04/2012     reports that he has  been smoking Cigarettes.  He has a 10.00 pack-year smoking history. He has never used smokeless tobacco. He reports that he does not drink alcohol or use drugs.  Allergies  Allergen Reactions  . Mold Extract [Trichophyton] Other (See Comments)    Per Allergy testing - extreme mold allergy even when on foods.   . Trazodone And Nefazodone     priapism    Family History  Problem Relation Age of Onset  . Hypertension Mother     Living 31  . Other Father     Work-related injury  . Mental illness Sister   . Mental illness Sister     Prior to Admission medications   Medication Sig Start Date End Date Taking? Authorizing Provider  ALPRAZolam (XANAX) 0.25 MG tablet Take 0.25 mg by mouth 2 (two) times daily as needed for anxiety (anxiety).    Yes Historical Provider, MD  buPROPion (WELLBUTRIN) 75 MG tablet Take 75 mg by mouth at bedtime.  12/16/13  Yes Historical Provider, MD  QUEtiapine (SEROQUEL) 25 MG tablet Take 25 mg by mouth 2 (two) times daily. 06/03/14  Yes Historical Provider, MD  QUEtiapine (SEROQUEL) 400 MG tablet Take 400 mg by mouth at bedtime.    Yes Historical Provider, MD  nitroGLYCERIN (NITROSTAT) 0.4 MG SL tablet Place 0.4 mg under the tongue every 5 (five) minutes as needed for chest pain (chest pain).     Historical Provider, MD    Physical Exam: Vitals:   02/07/16 1407 02/07/16 1743 02/07/16 1800 02/07/16 1810  BP: 132/84 147/92 (!) 162/94   Pulse: 81 71 81  Resp: 16 18 16    Temp:   99.1 F (37.3 C)   TempSrc:   Oral   SpO2: 100% 99% 100%   Weight:    87.1 kg (192 lb 1.6 oz)  Height:    6\' 2"  (1.88 m)   Constitutional: Afebrile, no CP, no vomiting. Currently in no acute distress. Eyes: PERRLA, no icterus, no nystagmus and conjunctivae normal ENMT: Mucous membranes mild dryness appreciated on exam. Posterior pharynx clear of any exudate or lesions. No thrush.  Neck: Normal, supple, no masses, no thyromegaly, no JVD Respiratory: Clear to auscultation bilaterally,  no wheezing, no crackles. Normal respiratory effort. No accessory muscle use.  Cardiovascular: S1 & S2 heard, regular rate and rhythm, no murmurs / rubs / gallops. No extremity edema. 2+ pedal pulses. No carotid bruits.  Abdomen: No distension, positive tenderness, no masses palpated. No hepatosplenomegaly. Bowel sounds normal.  Musculoskeletal: no clubbing / cyanosis. No joint deformity upper and lower extremities. Good ROM, no contractures. Normal muscle tone.  Skin: no rashes, lesions, ulcers. No induration Neurologic: CN 2-12 grossly intact. Sensation intact, DTR normal. Strength 5/5 in all 4 limbs.  Psychiatric: Normal judgment and insight. Alert and oriented x 3. Normal mood.    Labs on Admission: I have personally reviewed following labs and imaging studies  CBC:  Recent Labs Lab 02/07/16 1147  WBC 12.8*  HGB 13.7  HCT 41.1  MCV 86.3  PLT [id number]   Basic Metabolic Panel:  Recent Labs Lab 02/07/16 1147  NA 138  K 4.0  CL 104  CO2 28  GLUCOSE 98  BUN 9  CREATININE 0.96  CALCIUM 9.2   GFR: Estimated Creatinine Clearance: 97.5 mL/min (by C-G formula based on SCr of 0.96 mg/dL).   Liver Function Tests:  Recent Labs Lab 02/07/16 1147  AST 12*  ALT 11*  ALKPHOS 57  BILITOT 0.6  PROT 7.4  ALBUMIN 4.2    Recent Labs Lab 02/07/16 1147  LIPASE 207*   Urine analysis:    Component Value Date/Time   COLORURINE YELLOW 02/07/2016 1304   APPEARANCEUR CLEAR 02/07/2016 1304   LABSPEC 1.022 02/07/2016 1304   PHURINE 5.0 02/07/2016 1304   GLUCOSEU NEGATIVE 02/07/2016 1304   GLUCOSEU NEG mg/dL 03/09/2008 2025   HGBUR NEGATIVE 02/07/2016 1304   BILIRUBINUR NEGATIVE 02/07/2016 1304   KETONESUR NEGATIVE 02/07/2016 1304   PROTEINUR NEGATIVE 02/07/2016 1304   UROBILINOGEN 1.0 09/27/2014 2336   NITRITE NEGATIVE 02/07/2016 1304   LEUKOCYTESUR NEGATIVE 02/07/2016 1304   Radiological Exams on Admission: Ct Abdomen Pelvis W Contrast  Result Date: 02/07/2016 CLINICAL  DATA:  Acute lower abdominal pain. EXAM: CT ABDOMEN AND PELVIS WITH CONTRAST TECHNIQUE: Multidetector CT imaging of the abdomen and pelvis was performed using the standard protocol following bolus administration of intravenous contrast. CONTRAST:  137mL ISOVUE-300 IOPAMIDOL (ISOVUE-300) INJECTION 61% COMPARISON:  CT scan of September 27, 2014. FINDINGS: Lower chest: No acute abnormality. Hepatobiliary: No focal liver abnormality is seen. No gallstones, gallbladder wall thickening, or biliary dilatation. Pancreas: Unremarkable. No pancreatic ductal dilatation or surrounding inflammatory changes. Spleen: Normal in size without focal abnormality. Adrenals/Urinary Tract: Adrenal glands are unremarkable. Kidneys are normal, without renal calculi, focal lesion, or hydronephrosis. Bladder is unremarkable. Stomach/Bowel: The appendix appears normal. Focal diverticulitis of proximal sigmoid colon is noted with small amount of extraluminal gas suggesting perforation. There is no evidence of bowel obstruction. Vascular/Lymphatic: Aortic atherosclerosis. No enlarged abdominal or pelvic lymph nodes. Reproductive: Stable mild prostatic enlargement is noted. Other: No abdominal wall  hernia or abnormality. No abdominopelvic ascites. Musculoskeletal: Stable small lucencies are noted in the lower lumbar spine and pelvis. Moderate degenerative disc disease is noted at L3-4. IMPRESSION: Aortic atherosclerosis. Stable mild prostatic enlargement. Stable small lucencies are noted in the lower lumbar spine and pelvis. These most likely benign in etiology, but myeloma cannot be excluded. Focal sigmoid diverticulitis is noted with small amount of extra luminal gas adjacent to sigmoid colon suggesting perforation. Electronically Signed   By: Marijo Conception, M.D.   On: 02/07/2016 15:43    EKG: None   Assessment/Plan 1-Diverticulitis of colon with perforation: -Patient with nausea, abd pain and general malaise -Found to have acute  diverticulitis with microperforation  -Will admit to inpatient (med-surg) -Will start IV Zosyn and will recommend NPO -Provide IVF's and supportive care -Will provide PRN antiemetics and analgesics  2-HTN (hypertension): -Will use IV lopressor  3-Hyperlipidemia: -Will check lipid panel -Was not taking any meds for cholesterol at home  4-Tobacco abuse: -Cessation counseling provided -Will start nicotine patch   5-Depression and Anxiety: -No SI, and no hallucinations -Will hold PO meds for the next 24 hours -PRN ativan will be provided for anxiety   6-Elevated lipase: -most likely acute phase reaction, as no signs of pancreatitis appreciated and not abnormalities seen on CT scan. -will repeat levels in am -patient will be NPO overnight anyway  7-Leukocytosis -due to #1 -will provide IV antibiotics and IVF's -will follow WBC's trend  Time: 55 minutes   DVT prophylaxis: Heparin   Code Status: Full Family Communication:  Disposition Plan: anticipate discharge home when medically stable, 3-4 days Consults called: [facility] (called by EDP) Admission status: inpatient, med-surg bed; LOS > 2 midnights   Barton Dubois MD [facility] Pager [phone]  If 7PM-7AM, please contact night-coverage [email or web address] Password [facility]  02/07/2016, 6:47 PM

## 2016-02-08 LAB — CBC
HCT: 38.3 % — ABNORMAL LOW (ref 39.0–52.0)
Hemoglobin: 12.6 g/dL — ABNORMAL LOW (ref 13.0–17.0)
MCH: 28.7 pg (ref 26.0–34.0)
MCHC: 32.9 g/dL (ref 30.0–36.0)
MCV: 87.2 fL (ref 78.0–100.0)
Platelets: 263 K/uL (ref 150–400)
RBC: 4.39 MIL/uL (ref 4.22–5.81)
RDW: 14.8 % (ref 11.5–15.5)
WBC: 12 K/uL — ABNORMAL HIGH (ref 4.0–10.5)

## 2016-02-08 LAB — BASIC METABOLIC PANEL WITH GFR
Anion gap: 6 (ref 5–15)
BUN: 9 mg/dL (ref 6–20)
CO2: 27 mmol/L (ref 22–32)
Calcium: 8.5 mg/dL — ABNORMAL LOW (ref 8.9–10.3)
Chloride: 106 mmol/L (ref 101–111)
Creatinine, Ser: 1.11 mg/dL (ref 0.61–1.24)
GFR calc Af Amer: >60 mL/min (ref >60)
GFR calc non Af Amer: >60 mL/min (ref >60)
Glucose, Bld: 83 mg/dL (ref 65–99)
Potassium: 3.8 mmol/L (ref 3.5–5.1)
Sodium: 139 mmol/L (ref 135–145)

## 2016-02-08 LAB — LIPASE, BLOOD: Lipase: 18 U/L (ref 11–51)

## 2016-02-08 MED ORDER — BUPROPION HCL 75 MG PO TABS
75.0000 mg | ORAL_TABLET | Freq: Every day | ORAL | Status: DC
  Administered 2016-02-08 – 2016-02-11 (×4): 75 mg via ORAL
  Filled 2016-02-08 (×4): qty 1

## 2016-02-08 MED ORDER — MORPHINE SULFATE (PF) 2 MG/ML IV SOLN
2.0000 mg | Freq: Once | INTRAVENOUS | Status: AC
  Administered 2016-02-08: 2 mg via INTRAVENOUS
  Filled 2016-02-08: qty 1

## 2016-02-08 MED ORDER — QUETIAPINE FUMARATE 25 MG PO TABS
25.0000 mg | ORAL_TABLET | Freq: Two times a day (BID) | ORAL | Status: DC
  Administered 2016-02-08 – 2016-02-12 (×9): 25 mg via ORAL
  Filled 2016-02-08 (×10): qty 1

## 2016-02-08 MED ORDER — QUETIAPINE FUMARATE 400 MG PO TABS
400.0000 mg | ORAL_TABLET | Freq: Every day | ORAL | Status: DC
  Administered 2016-02-08 – 2016-02-11 (×4): 400 mg via ORAL
  Filled 2016-02-08 (×4): qty 1

## 2016-02-08 NOTE — Progress Notes (Signed)
[facility] PROGRESS NOTE  Paul Brady [id number] DOB: 1955/06/11 DOA: 02/07/2016 PCP: Philis Fendt, MD  Interim summary and HPI 60 y.o. male with PMH significant for  HTN, HLD, bipolar disorder and tobacco abuse; Who presented to ED secondary to abd pain and nausea. Patient was seen secondary to abd pain and pelvic pressure on 01/31/16, he reported some difficulty voiding and intermittent testicular pain at that time. Work up done and referred for outpatient urology follow up. Those symptoms have since then improved. Presented now with abd pain (LLQ, cramping, with radiation across lower abdomen and with associated bloating sensation and associated Nausea/constipaiton).  Patient deneis fever, but endorses chills. Also deneis CP, SOB, vomiting, hematuria, hematochezia, melena, focal weakness or any other complaints.  Assessment/Plan: 1-Diverticulitis of colon with perforation: -Patient continue to have nausea, abd pain and no BM's yet -Found to have acute diverticulitis with microperforation  -Will follow general surgery rec's -Will continue IV Zosyn and will recommend continue sip and Ice chips -Continue IVF's and supportive care -Will continue PRN antiemetics and analgesics  2-HTN (hypertension): -Will continue IV lopressor  3-Hyperlipidemia: -Will check lipid panel -Was not taking any meds for cholesterol at home  4-Tobacco abuse: -Cessation counseling provided -Will continue nicotine patch   5-Depression and Anxiety: -No SI, and no hallucinations -Will resume Seroquel and bupropion today -PRN ativan will be provided for anxiety   6-Elevated lipase: -most likely acute phase reaction, as no signs of pancreatitis appreciated and not abnormalities seen on CT scan. -repeat lipase is neg  7-Leukocytosis -due to #1 -will continue IV antibiotics and IVF's -will follow WBC's trend  Code Status: Full Family Communication: wife at bedside  Disposition Plan:  Remains inpatient, continue IV antibiotics, continue supportive care and follow clinical response.    Consultants:  CCS  Procedures:  See below for x-ray reports   Antibiotics:  Zosyn 11/29  HPI/Subjective: Afebrile, continue to have abd pain, nausea and is not passing BM's.  Objective: Vitals:   02/08/16 0433 02/08/16 1404  BP: 123/68 (!) 148/90  Pulse: 74 61  Resp: 18 18  Temp: 99.2 F (37.3 C) 99 F (37.2 C)    Intake/Output Summary (Last 24 hours) at 02/08/16 1840 Last data filed at 02/08/16 1746  Gross per 24 hour  Intake             1635 ml  Output             1550 ml  Net               85 ml   Filed Weights   02/07/16 1810  Weight: 87.1 kg (192 lb 1.6 oz)    Exam:   General:  Afebrile, non toxic in appearance. Reports having nausea, abd pain and only passing gas.  Cardiovascular: S1 and S2, no rubs, no gallops; no JVD  Respiratory: CTA bilaterally   Abdomen: soft, decrease BS, tender to palpation (mainly on LLQ)  Musculoskeletal: no edema, no cyanosis   Data Reviewed: Basic Metabolic Panel:  Recent Labs Lab 02/07/16 1147 02/07/16 1953 02/08/16 0449  NA 138  --  139  K 4.0  --  3.8  CL 104  --  106  CO2 28  --  27  GLUCOSE 98  --  83  BUN 9  --  9  CREATININE 0.96  --  1.11  CALCIUM 9.2  --  8.5*  MG  --  2.0  --   PHOS  --  3.0  --  Liver Function Tests:  Recent Labs Lab 02/07/16 1147 02/07/16 1953  AST 12* 11*  ALT 11* 10*  ALKPHOS 57 50  BILITOT 0.6 0.5  PROT 7.4 6.8  ALBUMIN 4.2 3.9    Recent Labs Lab 02/07/16 1147 02/08/16 0449  LIPASE 207* 18   CBC:  Recent Labs Lab 02/07/16 1147 02/08/16 0449  WBC 12.8* 12.0*  HGB 13.7 12.6*  HCT 41.1 38.3*  MCV 86.3 87.2  PLT 283 263   Studies: Ct Abdomen Pelvis W Contrast  Result Date: 02/07/2016 CLINICAL DATA:  Acute lower abdominal pain. EXAM: CT ABDOMEN AND PELVIS WITH CONTRAST TECHNIQUE: Multidetector CT imaging of the abdomen and pelvis was performed  using the standard protocol following bolus administration of intravenous contrast. CONTRAST:  141mL ISOVUE-300 IOPAMIDOL (ISOVUE-300) INJECTION 61% COMPARISON:  CT scan of September 27, 2014. FINDINGS: Lower chest: No acute abnormality. Hepatobiliary: No focal liver abnormality is seen. No gallstones, gallbladder wall thickening, or biliary dilatation. Pancreas: Unremarkable. No pancreatic ductal dilatation or surrounding inflammatory changes. Spleen: Normal in size without focal abnormality. Adrenals/Urinary Tract: Adrenal glands are unremarkable. Kidneys are normal, without renal calculi, focal lesion, or hydronephrosis. Bladder is unremarkable. Stomach/Bowel: The appendix appears normal. Focal diverticulitis of proximal sigmoid colon is noted with small amount of extraluminal gas suggesting perforation. There is no evidence of bowel obstruction. Vascular/Lymphatic: Aortic atherosclerosis. No enlarged abdominal or pelvic lymph nodes. Reproductive: Stable mild prostatic enlargement is noted. Other: No abdominal wall hernia or abnormality. No abdominopelvic ascites. Musculoskeletal: Stable small lucencies are noted in the lower lumbar spine and pelvis. Moderate degenerative disc disease is noted at L3-4. IMPRESSION: Aortic atherosclerosis. Stable mild prostatic enlargement. Stable small lucencies are noted in the lower lumbar spine and pelvis. These most likely benign in etiology, but myeloma cannot be excluded. Focal sigmoid diverticulitis is noted with small amount of extra luminal gas adjacent to sigmoid colon suggesting perforation. Electronically Signed   By: Marijo Conception, M.D.   On: 02/07/2016 15:43    Scheduled Meds: . buPROPion  75 mg Oral QHS  . famotidine (PEPCID) IV  20 mg Intravenous Q24H  . heparin  5,000 Units Subcutaneous Q8H  . metoprolol  2.5 mg Intravenous Q8H  .  morphine injection  2 mg Intravenous Once  . piperacillin-tazobactam (ZOSYN)  IV  3.375 g Intravenous Q8H  . QUEtiapine  25 mg  Oral BID  . QUEtiapine  400 mg Oral QHS   Continuous Infusions: . sodium chloride 100 mL/hr at 02/08/16 1554    Principal Problem:   Diverticulitis of colon with perforation Active Problems:   HTN (hypertension)   Hyperlipidemia   Tobacco abuse   Depression   Elevated lipase   Diverticulitis large intestine    Time spent: 25 minutes    Barton Dubois  [facility] Pager [phone]. If 7PM-7AM, please contact night-coverage at [email or web address], password [facility] 02/08/2016, 6:40 PM  LOS: 1 day

## 2016-02-08 NOTE — Progress Notes (Signed)
Subjective: He reports being about the same as last PM.  No improvement.  He points to pain being more in the LLQ than last PM.    Objective: Vital signs in last 24 hours: Temp:  [99.1 F (37.3 C)-99.5 F (37.5 C)] 99.2 F (37.3 C) (11/30 0433) Pulse Rate:  [68-81] 74 (11/30 0433) Resp:  [16-18] 18 (11/30 0433) BP: (123-162)/(68-94) 123/68 (11/30 0433) SpO2:  [99 %-100 %] 99 % (11/30 0433) Weight:  [87.1 kg (192 lb 1.6 oz)] 87.1 kg (192 lb 1.6 oz) (11/29 1810) Last BM Date: 01/29/16  Intake/Output from previous day: 11/29 0701 - 11/30 0700 In: 1175 [I.V.:1175] Out: 350 [Urine:350] Intake/Output this shift: Total I/O In: 220 [P.O.:220] Out: 350 [Urine:350]  General appearance: alert, cooperative and no distress GI: soft, few BS, most tender LLQ  Lab Results:   Recent Labs  02/07/16 1147 02/08/16 0449  WBC 12.8* 12.0*  HGB 13.7 12.6*  HCT 41.1 38.3*  PLT 283 263    BMET  Recent Labs  02/07/16 1147 02/08/16 0449  NA 138 139  K 4.0 3.8  CL 104 106  CO2 28 27  GLUCOSE 98 83  BUN 9 9  CREATININE 0.96 1.11  CALCIUM 9.2 8.5*   PT/INR No results for input(s): LABPROT, INR in the last 72 hours.   Recent Labs Lab 02/07/16 1147 02/07/16 1953  AST 12* 11*  ALT 11* 10*  ALKPHOS 57 50  BILITOT 0.6 0.5  PROT 7.4 6.8  ALBUMIN 4.2 3.9     Lipase     Component Value Date/Time   LIPASE 18 02/08/2016 0449     Studies/Results: Ct Abdomen Pelvis W Contrast  Result Date: 02/07/2016 CLINICAL DATA:  Acute lower abdominal pain. EXAM: CT ABDOMEN AND PELVIS WITH CONTRAST TECHNIQUE: Multidetector CT imaging of the abdomen and pelvis was performed using the standard protocol following bolus administration of intravenous contrast. CONTRAST:  126mL ISOVUE-300 IOPAMIDOL (ISOVUE-300) INJECTION 61% COMPARISON:  CT scan of September 27, 2014. FINDINGS: Lower chest: No acute abnormality. Hepatobiliary: No focal liver abnormality is seen. No gallstones, gallbladder wall  thickening, or biliary dilatation. Pancreas: Unremarkable. No pancreatic ductal dilatation or surrounding inflammatory changes. Spleen: Normal in size without focal abnormality. Adrenals/Urinary Tract: Adrenal glands are unremarkable. Kidneys are normal, without renal calculi, focal lesion, or hydronephrosis. Bladder is unremarkable. Stomach/Bowel: The appendix appears normal. Focal diverticulitis of proximal sigmoid colon is noted with small amount of extraluminal gas suggesting perforation. There is no evidence of bowel obstruction. Vascular/Lymphatic: Aortic atherosclerosis. No enlarged abdominal or pelvic lymph nodes. Reproductive: Stable mild prostatic enlargement is noted. Other: No abdominal wall hernia or abnormality. No abdominopelvic ascites. Musculoskeletal: Stable small lucencies are noted in the lower lumbar spine and pelvis. Moderate degenerative disc disease is noted at L3-4. IMPRESSION: Aortic atherosclerosis. Stable mild prostatic enlargement. Stable small lucencies are noted in the lower lumbar spine and pelvis. These most likely benign in etiology, but myeloma cannot be excluded. Focal sigmoid diverticulitis is noted with small amount of extra luminal gas adjacent to sigmoid colon suggesting perforation. Electronically Signed   By: Marijo Conception, M.D.   On: 02/07/2016 15:43    Medications: . buPROPion  75 mg Oral QHS  . famotidine (PEPCID) IV  20 mg Intravenous Q24H  . heparin  5,000 Units Subcutaneous Q8H  . metoprolol  2.5 mg Intravenous Q8H  . piperacillin-tazobactam (ZOSYN)  IV  3.375 g Intravenous Q8H  . QUEtiapine  25 mg Oral BID  . QUEtiapine  400  mg Oral QHS   . sodium chloride 100 mL/hr at 02/08/16 0549    Assessment/Plan Sigmoid diverticulitis with microperforation Chronic constipaton Hx of chest pain and testicular pain Hx of major depressive disorder with psychotic features - stable Hypertension Hyperlipidemia Tobacco use FEN:  Ice chips and sips/IV fluids ID:   Zosyn 02/07/16 =>> day 2   DVT:  Heparin  Plan:  Conservative management, abx and bowel rest, I did let him have some ice chips and sips.   LOS: 1 day    [place] 02/08/2016 [phone]

## 2016-02-09 LAB — CBC
HCT: 36.3 % — ABNORMAL LOW (ref 39.0–52.0)
HEMOGLOBIN: 12 g/dL — AB (ref 13.0–17.0)
MCH: 28.3 pg (ref 26.0–34.0)
MCHC: 33.1 g/dL (ref 30.0–36.0)
MCV: 85.6 fL (ref 78.0–100.0)
PLATELETS: 250 10*3/uL (ref 150–400)
RBC: 4.24 MIL/uL (ref 4.22–5.81)
RDW: 14.4 % (ref 11.5–15.5)
WBC: 10.3 10*3/uL (ref 4.0–10.5)

## 2016-02-09 LAB — BASIC METABOLIC PANEL
Anion gap: 7 (ref 5–15)
BUN: 6 mg/dL (ref 6–20)
CHLORIDE: 108 mmol/L (ref 101–111)
CO2: 25 mmol/L (ref 22–32)
CREATININE: 0.97 mg/dL (ref 0.61–1.24)
Calcium: 8.4 mg/dL — ABNORMAL LOW (ref 8.9–10.3)
Glucose, Bld: 96 mg/dL (ref 65–99)
POTASSIUM: 3.6 mmol/L (ref 3.5–5.1)
SODIUM: 140 mmol/L (ref 135–145)

## 2016-02-09 LAB — LIPID PANEL
CHOLESTEROL: 156 mg/dL (ref 0–200)
HDL: 41 mg/dL (ref >40)
LDL Cholesterol: 95 mg/dL (ref 0–99)
TRIGLYCERIDES: 98 mg/dL (ref <150)
Total CHOL/HDL Ratio: 3.8 RATIO
VLDL: 20 mg/dL (ref 0–40)

## 2016-02-09 MED ORDER — NICOTINE 14 MG/24HR TD PT24
14.0000 mg | MEDICATED_PATCH | Freq: Every day | TRANSDERMAL | Status: DC
  Administered 2016-02-09 – 2016-02-12 (×4): 14 mg via TRANSDERMAL
  Filled 2016-02-09 (×4): qty 1

## 2016-02-09 NOTE — Progress Notes (Signed)
[facility] PROGRESS NOTE  Paul Brady [id number] DOB: [birth year] DOA: 02/07/2016 PCP: Philis Fendt, MD  Interim summary and HPI 60 y.o. male with PMH significant for  HTN, HLD, bipolar disorder and tobacco abuse; Who presented to ED secondary to abd pain and nausea. Patient was seen secondary to abd pain and pelvic pressure on 01/31/16, he reported some difficulty voiding and intermittent testicular pain at that time. Work up done and referred for outpatient urology follow up. Those symptoms have since then improved. Presented now with abd pain (LLQ, cramping, with radiation across lower abdomen and with associated bloating sensation and associated Nausea/constipaiton).  Patient deneis fever, but endorses chills. Also deneis CP, SOB, vomiting, hematuria, hematochezia, melena, focal weakness or any other complaints.  Assessment/Plan: 1-Diverticulitis of colon with perforation: -Patient continue to have nausea, abd pain and no BM's yet -Found to have acute diverticulitis with microperforation  -Will follow general surgery rec's -Will continue IV Zosyn and will recommend continue Sips of CLD and Ice chips. Ok to have his oral meds. -Continue IVF's and supportive care -Will continue PRN antiemetics and analgesics -CT scan in the next 24-48 hours to follow clinical response.  2-HTN (hypertension): -Will continue IV lopressor  3-Hyperlipidemia: -Will check lipid panel -Was not taking any meds for cholesterol at home  4-Tobacco abuse: -Cessation counseling provided -Will continue nicotine patch   5-Depression and Anxiety: -No SI, and no hallucinations -Will resume Seroquel and bupropion today -PRN ativan will be provided for anxiety   6-Elevated lipase: -most likely acute phase reaction, as no signs of pancreatitis appreciated and not abnormalities seen on CT scan. -repeated lipase neg  7-Leukocytosis -due to #1 -will continue IV antibiotics and IVF's -WBC's  now WNL  Code Status: Full Family Communication: wife at bedside  Disposition Plan: Remains inpatient, continue IV antibiotics, continue supportive care and follow clinical response.    Consultants:  [facility]  Procedures:  See below for x-ray reports   Antibiotics:  Zosyn 11/29  HPI/Subjective: Low grade temp overnight, continue to have abd pain, nausea and is not moving his bowels yet.  Objective: Vitals:   02/09/16 0530 02/09/16 1328  BP: 118/62 (!) 141/93  Pulse: 78 74  Resp: 16 20  Temp: 99.6 F (37.6 C) 98.7 F (37.1 C)    Intake/Output Summary (Last 24 hours) at 02/09/16 1743 Last data filed at 02/09/16 1742  Gross per 24 hour  Intake             3180 ml  Output             2600 ml  Net              580 ml   Filed Weights   02/07/16 1810  Weight: 87.1 kg (192 lb 1.6 oz)    Exam:   General:  Low grade temp overnight. Non toxic in appearance. Reports still having nausea and intermittent episodes of LLQ pain. Only passing gas; no BM yet  Cardiovascular: S1 and S2, no rubs, no gallops; no JVD  Respiratory: CTA bilaterally   Abdomen: soft, decrease BS, tender to palpation (mainly on LLQ)  Musculoskeletal: no edema, no cyanosis   Data Reviewed: Basic Metabolic Panel:  Recent Labs Lab 02/07/16 1147 02/07/16 1953 02/08/16 0449 02/09/16 0439  NA 138  --  139 140  K 4.0  --  3.8 3.6  CL 104  --  106 108  CO2 28  --  27 25  GLUCOSE 98  --  83 96  BUN 9  --  9 6  CREATININE 0.96  --  1.11 0.97  CALCIUM 9.2  --  8.5* 8.4*  MG  --  2.0  --   --   PHOS  --  3.0  --   --    Liver Function Tests:  Recent Labs Lab 02/07/16 1147 02/07/16 1953  AST 12* 11*  ALT 11* 10*  ALKPHOS 57 50  BILITOT 0.6 0.5  PROT 7.4 6.8  ALBUMIN 4.2 3.9    Recent Labs Lab 02/07/16 1147 02/08/16 0449  LIPASE 207* 18   CBC:  Recent Labs Lab 02/07/16 1147 02/08/16 0449 02/09/16 0439  WBC 12.8* 12.0* 10.3  HGB 13.7 12.6* 12.0*  HCT 41.1 38.3* 36.3*  MCV  86.3 87.2 85.6  PLT 283 263 250   Studies: No results found.  Scheduled Meds: . buPROPion  75 mg Oral QHS  . famotidine (PEPCID) IV  20 mg Intravenous Q24H  . heparin  5,000 Units Subcutaneous Q8H  . metoprolol  2.5 mg Intravenous Q8H  . nicotine  14 mg Transdermal Daily  . piperacillin-tazobactam (ZOSYN)  IV  3.375 g Intravenous Q8H  . QUEtiapine  25 mg Oral BID  . QUEtiapine  400 mg Oral QHS   Continuous Infusions: . sodium chloride 100 mL/hr at 02/09/16 0157    Principal Problem:   Diverticulitis of colon with perforation Active Problems:   HTN (hypertension)   Hyperlipidemia   Tobacco abuse   Depression   Elevated lipase   Diverticulitis large intestine    Time spent: 25 minutes    Barton Dubois  [facility] Pager [phone]. If 7PM-7AM, please contact night-coverage at [email or web address], password [facility] 02/09/2016, 5:43 PM  LOS: 2 days

## 2016-02-09 NOTE — Progress Notes (Signed)
Patient ID: Paul Brady, male   DOB: 06-18-1955, 60 y.o.   MRN: [id number]  [facility] Progress Note     Subjective: Patient reports no change in his pain. Continues to have persistent intermittent LLQ abdominal pain. The pain is sometimes worse with clear liquids but not always. Denies n/v. States that his last BM was 11/20 despite using laxatives at home. States that he was supposed to move into a new home today, and would really like to be discharged.  Objective: Vital signs in last 24 hours: Temp:  [99 F (37.2 C)-100.7 F (38.2 C)] 99.6 F (37.6 C) (12/01 0530) Pulse Rate:  [61-78] 78 (12/01 0530) Resp:  [16-18] 16 (12/01 0530) BP: (118-148)/(62-90) 118/62 (12/01 0530) SpO2:  [97 %-100 %] 97 % (12/01 0530) Last BM Date: 01/29/16  Intake/Output from previous day: 11/30 0701 - 12/01 0700 In: 3108.3 [P.O.:490; I.V.:2418.3; IV Piggyback:200] Out: 2650 [Urine:2650] Intake/Output this shift: Total I/O In: 240 [P.O.:240] Out: 350 [Urine:350]  PE: Gen:  Alert, NAD, pleasant Pulm:  Effort normal Abd: Soft, ND, few BS, no HSM, moderate TTP LLQ  Lab Results:   Recent Labs  02/08/16 0449 02/09/16 0439  WBC 12.0* 10.3  HGB 12.6* 12.0*  HCT 38.3* 36.3*  PLT 263 250   BMET  Recent Labs  02/08/16 0449 02/09/16 0439  NA 139 140  K 3.8 3.6  CL 106 108  CO2 27 25  GLUCOSE 83 96  BUN 9 6  CREATININE 1.11 0.97  CALCIUM 8.5* 8.4*   PT/INR No results for input(s): LABPROT, INR in the last 72 hours. CMP     Component Value Date/Time   NA 140 02/09/2016 0439   NA 142 10/27/2014 1004   K 3.6 02/09/2016 0439   K 3.7 10/27/2014 1004   CL 108 02/09/2016 0439   CO2 25 02/09/2016 0439   CO2 25 10/27/2014 1004   GLUCOSE 96 02/09/2016 0439   GLUCOSE 77 10/27/2014 1004   BUN 6 02/09/2016 0439   BUN 14.6 10/27/2014 1004   CREATININE 0.97 02/09/2016 0439   CREATININE 1.0 10/27/2014 1004   CALCIUM 8.4 (L) 02/09/2016 0439   CALCIUM 9.5 10/27/2014 1004   PROT 6.8 02/07/2016 1953   PROT 6.7 10/27/2014 1004   ALBUMIN 3.9 02/07/2016 1953   ALBUMIN 3.9 10/27/2014 1004   AST 11 (L) 02/07/2016 1953   AST 14 10/27/2014 1004   ALT 10 (L) 02/07/2016 1953   ALT 12 10/27/2014 1004   ALKPHOS 50 02/07/2016 1953   ALKPHOS 78 10/27/2014 1004   BILITOT 0.5 02/07/2016 1953   BILITOT 0.44 10/27/2014 1004   GFRNONAA >60 02/09/2016 0439   GFRAA >60 02/09/2016 0439   Lipase     Component Value Date/Time   LIPASE 18 02/08/2016 0449       Studies/Results: Ct Abdomen Pelvis W Contrast  Result Date: 02/07/2016 CLINICAL DATA:  Acute lower abdominal pain. EXAM: CT ABDOMEN AND PELVIS WITH CONTRAST TECHNIQUE: Multidetector CT imaging of the abdomen and pelvis was performed using the standard protocol following bolus administration of intravenous contrast. CONTRAST:  148mL ISOVUE-300 IOPAMIDOL (ISOVUE-300) INJECTION 61% COMPARISON:  CT scan of September 27, 2014. FINDINGS: Lower chest: No acute abnormality. Hepatobiliary: No focal liver abnormality is seen. No gallstones, gallbladder wall thickening, or biliary dilatation. Pancreas: Unremarkable. No pancreatic ductal dilatation or surrounding inflammatory changes. Spleen: Normal in size without focal abnormality. Adrenals/Urinary Tract: Adrenal glands are unremarkable. Kidneys are normal, without renal calculi, focal lesion, or hydronephrosis. Bladder is unremarkable.  Stomach/Bowel: The appendix appears normal. Focal diverticulitis of proximal sigmoid colon is noted with small amount of extraluminal gas suggesting perforation. There is no evidence of bowel obstruction. Vascular/Lymphatic: Aortic atherosclerosis. No enlarged abdominal or pelvic lymph nodes. Reproductive: Stable mild prostatic enlargement is noted. Other: No abdominal wall hernia or abnormality. No abdominopelvic ascites. Musculoskeletal: Stable small lucencies are noted in the lower lumbar spine and pelvis. Moderate degenerative disc disease is noted at  L3-4. IMPRESSION: Aortic atherosclerosis. Stable mild prostatic enlargement. Stable small lucencies are noted in the lower lumbar spine and pelvis. These most likely benign in etiology, but myeloma cannot be excluded. Focal sigmoid diverticulitis is noted with small amount of extra luminal gas adjacent to sigmoid colon suggesting perforation. Electronically Signed   By: Marijo Conception, M.D.   On: 02/07/2016 15:43    Anti-infectives: Anti-infectives    Start     Dose/Rate Route Frequency Ordered Stop   02/08/16 0000  Ampicillin-Sulbactam (UNASYN) 3 g in sodium chloride 0.9 % 100 mL IVPB  Status:  Discontinued     3 g 200 mL/hr over 30 Minutes Intravenous Every 8 hours 02/07/16 1846 02/08/16 0836   02/07/16 2200  piperacillin-tazobactam (ZOSYN) IVPB 3.375 g     3.375 g 12.5 mL/hr over 240 Minutes Intravenous Every 8 hours 02/07/16 1906     02/07/16 1630  Ampicillin-Sulbactam (UNASYN) 3 g in sodium chloride 0.9 % 100 mL IVPB     3 g 200 mL/hr over 30 Minutes Intravenous  Once 02/07/16 1617 02/07/16 1744   02/07/16 1615  piperacillin-tazobactam (ZOSYN) IVPB 3.375 g  Status:  Discontinued     3.375 g 12.5 mL/hr over 240 Minutes Intravenous Every 6 hours 02/07/16 1603 02/07/16 1617       Assessment/Plan Sigmoid diverticulitis with microperforation - CT 11/29: Focal sigmoid diverticulitis is noted with small amount of extra luminal gas adjacent to sigmoid colon suggesting perforation - WBC today WNL, TMAX 100.7 over night - persistent intermittent LLQ abdominal pain  Constipation - patient reports last BM 11/20 Hx of chest pain and testicular pain Hx of major depressive disorder with psychotic features Hypertension Hyperlipidemia Tobacco use  FEN:  Ice chips and sips/IV fluids ID:  Zosyn 02/07/16 =>> day 3 DVT:  Heparin  Plan:  no change in abdominal pain, persistent intermittent LLQ pain. WBC WNL today, TMAX 100.7 over night. Continue conservative management with IV antibiotics and  bowel rest. If pain does not improves may need to consider repeat CT scan   LOS: 2 days    Jerrye Beavers , [facility] 02/09/2016, 10:44 AM Pager: [phone] Consults: [phone] Mon-Fri 7:00 am-4:30 pm Sat-Sun 7:00 am-11:30 am

## 2016-02-10 DIAGNOSIS — N401 Enlarged prostate with lower urinary tract symptoms: Secondary | ICD-10-CM

## 2016-02-10 LAB — CBC
HCT: 33.6 % — ABNORMAL LOW (ref 39.0–52.0)
HEMOGLOBIN: 11.4 g/dL — AB (ref 13.0–17.0)
MCH: 28.8 pg (ref 26.0–34.0)
MCHC: 33.9 g/dL (ref 30.0–36.0)
MCV: 84.8 fL (ref 78.0–100.0)
Platelets: 242 10*3/uL (ref 150–400)
RBC: 3.96 MIL/uL — ABNORMAL LOW (ref 4.22–5.81)
RDW: 14.3 % (ref 11.5–15.5)
WBC: 10.4 10*3/uL (ref 4.0–10.5)

## 2016-02-10 MED ORDER — AMLODIPINE BESYLATE 5 MG PO TABS
5.0000 mg | ORAL_TABLET | Freq: Every day | ORAL | Status: DC
  Administered 2016-02-10 – 2016-02-11 (×2): 5 mg via ORAL
  Filled 2016-02-10 (×2): qty 1

## 2016-02-10 MED ORDER — HYDRALAZINE HCL 20 MG/ML IJ SOLN: 10.0000 mg | Freq: Four times a day (QID) | INTRAMUSCULAR | Status: DC | PRN

## 2016-02-10 MED ORDER — TAMSULOSIN HCL 0.4 MG PO CAPS
0.4000 mg | ORAL_CAPSULE | Freq: Every day | ORAL | Status: DC
  Administered 2016-02-10 – 2016-02-11 (×2): 0.4 mg via ORAL
  Filled 2016-02-10 (×2): qty 1

## 2016-02-10 MED ORDER — FAMOTIDINE 20 MG PO TABS
20.0000 mg | ORAL_TABLET | Freq: Every day | ORAL | Status: DC
  Administered 2016-02-10 – 2016-02-12 (×3): 20 mg via ORAL
  Filled 2016-02-10 (×3): qty 1

## 2016-02-10 NOTE — Progress Notes (Signed)
[facility] PROGRESS NOTE  Paul Brady [id number] DOB: 1955/08/15 DOA: 02/07/2016 PCP: Philis Fendt, MD  Interim summary and HPI 60 y.o. male with PMH significant for  HTN, HLD, bipolar disorder and tobacco abuse; Who presented to ED secondary to abd pain and nausea. Patient was seen secondary to abd pain and pelvic pressure on 01/31/16, he reported some difficulty voiding and intermittent testicular pain at that time. Work up done and referred for outpatient urology follow up. Those symptoms have since then improved. Presented now with abd pain (LLQ, cramping, with radiation across lower abdomen and with associated bloating sensation and associated Nausea/constipaiton).  Patient deneis fever, but endorses chills. Also deneis CP, SOB, vomiting, hematuria, hematochezia, melena, focal weakness or any other complaints.  Assessment/Plan: 1-Diverticulitis of colon with perforation: -Patient continue to have nausea, abd pain and no BM's yet -Found to have acute diverticulitis with microperforation  -Will continue IV Zosyn and per surgery will advance diet to full liquid  -Continue IVF's and supportive care -Will continue PRN antiemetics and analgesics -CT scan in the next 24-48 hours to follow clinical response.  2-HTN (hypertension): -Will start amlodipine -PRN hydralazine   3-Hyperlipidemia: -Will check lipid panel -Was not taking any meds for cholesterol at home  4-Tobacco abuse: -Cessation counseling provided -Will continue nicotine patch   5-Depression and Anxiety: -No SI, and no hallucinations -Will resume Seroquel and bupropion today -PRN ativan will be provided for anxiety   6-Elevated lipase: -most likely acute phase reaction, as no signs of pancreatitis appreciated and not abnormalities seen on CT scan. -repeated lipase neg  7-Leukocytosis -due to #1 -will continue IV antibiotics and IVF's -WBC's now WNL  8-BPH -will start patient on  flomax  Code Status: Full Family Communication: wife at bedside  Disposition Plan: Remains inpatient, continue IV antibiotics, continue supportive care and follow clinical response.    Consultants:  CCS  Procedures:  See below for x-ray reports   Antibiotics:  Zosyn 11/29  HPI/Subjective: Continue to have abd pain, nausea (even nausea is better he said) and is not moving his bowels yet. No fever   Objective: Vitals:   02/10/16 0545 02/10/16 1410  BP: (!) 141/86 (!) 169/86  Pulse: 78 94  Resp: 18 18  Temp: 99 F (37.2 C) 99.5 F (37.5 C)    Intake/Output Summary (Last 24 hours) at 02/10/16 1603 Last data filed at 02/10/16 1300  Gross per 24 hour  Intake             2800 ml  Output             2050 ml  Net              750 ml   Filed Weights   02/07/16 1810  Weight: 87.1 kg (192 lb 1.6 oz)    Exam:   General:  Afebrile, no CP and no SOB. Reports improvement and decrease in his nausea, no vomiting. Still with intermittent episodes of abd pain. No BM's  Cardiovascular: S1 and S2, no rubs, no gallops; no JVD  Respiratory: CTA bilaterally   Abdomen: soft, decrease BS, tender to palpation (mainly on LLQ)  Musculoskeletal: no edema, no cyanosis   Data Reviewed: Basic Metabolic Panel:  Recent Labs Lab 02/07/16 1147 02/07/16 1953 02/08/16 0449 02/09/16 0439  NA 138  --  139 140  K 4.0  --  3.8 3.6  CL 104  --  106 108  CO2 28  --  27 25  GLUCOSE  98  --  83 96  BUN 9  --  9 6  CREATININE 0.96  --  1.11 0.97  CALCIUM 9.2  --  8.5* 8.4*  MG  --  2.0  --   --   PHOS  --  3.0  --   --    Liver Function Tests:  Recent Labs Lab 02/07/16 1147 02/07/16 1953  AST 12* 11*  ALT 11* 10*  ALKPHOS 57 50  BILITOT 0.6 0.5  PROT 7.4 6.8  ALBUMIN 4.2 3.9    Recent Labs Lab 02/07/16 1147 02/08/16 0449  LIPASE 207* 18   CBC:  Recent Labs Lab 02/07/16 1147 02/08/16 0449 02/09/16 0439 02/10/16 0506  WBC 12.8* 12.0* 10.3 10.4  HGB 13.7 12.6*  12.0* 11.4*  HCT 41.1 38.3* 36.3* 33.6*  MCV 86.3 87.2 85.6 84.8  PLT 283 263 250 242   Studies: No results found.  Scheduled Meds: . amLODipine  5 mg Oral Daily  . buPROPion  75 mg Oral QHS  . famotidine  20 mg Oral Daily  . heparin  5,000 Units Subcutaneous Q8H  . nicotine  14 mg Transdermal Daily  . piperacillin-tazobactam (ZOSYN)  IV  3.375 g Intravenous Q8H  . QUEtiapine  25 mg Oral BID  . QUEtiapine  400 mg Oral QHS  . tamsulosin  0.4 mg Oral QPC supper   Continuous Infusions: . sodium chloride 1,000 mL (02/09/16 2316)    Principal Problem:   Diverticulitis of colon with perforation Active Problems:   HTN (hypertension)   Hyperlipidemia   Tobacco abuse   Depression   Elevated lipase   Diverticulitis large intestine    Time spent: 25 minutes   Barton Dubois  [facility] Pager [phone]. If 7PM-7AM, please contact night-coverage at [email or web address], password [facility] 02/10/2016, 4:03 PM  LOS: 3 days

## 2016-02-10 NOTE — Progress Notes (Signed)
General Surgery [facility]  Assessment & Plan:  Acute diverticulitis with microperforation  On IV Zosyn  Tolerating clear liquids - advance to full liquids today  Still with some pain and tenderness, WBC normal  Check WBC in AM 12/3 - if pain & tenderness persist, may need repeat CT scan  Will follow with you         Earnstine Regal, MD, [facility]       Office: [phone]    Subjective: Patient comfortable, mild pain.  No BM, passing flatus.  No nausea or emesis.  Tolerating clear liquids.  Objective: Vital signs in last 24 hours: Temp:  [98.7 F (37.1 C)-99 F (37.2 C)] 99 F (37.2 C) (12/02 0545) Pulse Rate:  [72-78] 78 (12/02 0545) Resp:  [18-20] 18 (12/02 0545) BP: (141-154)/(86-93) 141/86 (12/02 0545) SpO2:  [99 %-100 %] 99 % (12/02 0545) Last BM Date: 02/28/16  Intake/Output from previous day: 12/01 0701 - 12/02 0700 In: 3260 [P.O.:660; I.V.:2400; IV Piggyback:200] Out: 1450 [Urine:1450] Intake/Output this shift: Total I/O In: 100 [P.O.:100] Out: 500 [Urine:500]  Physical Exam: HEENT - sclerae clear, mucous membranes moist Abdomen - soft without distension; mild diffuse tenderness, voluntary guarding, no mass Ext - no edema, non-tender Neuro - alert & oriented, no focal deficits  Lab Results:   Recent Labs  02/09/16 0439 02/10/16 0506  WBC 10.3 10.4  HGB 12.0* 11.4*  HCT 36.3* 33.6*  PLT 250 242   BMET  Recent Labs  02/08/16 0449 02/09/16 0439  NA 139 140  K 3.8 3.6  CL 106 108  CO2 27 25  GLUCOSE 83 96  BUN 9 6  CREATININE 1.11 0.97  CALCIUM 8.5* 8.4*   PT/INR No results for input(s): LABPROT, INR in the last 72 hours. Comprehensive Metabolic Panel:    Component Value Date/Time   NA 140 02/09/2016 0439   NA 139 02/08/2016 0449   NA 142 10/27/2014 1004   NA 144 10/04/2014 1352   K 3.6 02/09/2016 0439   K 3.8 02/08/2016 0449   K 3.7 10/27/2014 1004   K 4.3 10/04/2014 1352    CL 108 02/09/2016 0439   CL 106 02/08/2016 0449   CO2 25 02/09/2016 0439   CO2 27 02/08/2016 0449   CO2 25 10/27/2014 1004   CO2 28 10/04/2014 1352   BUN 6 02/09/2016 0439   BUN 9 02/08/2016 0449   BUN 14.6 10/27/2014 1004   BUN 11.6 10/04/2014 1352   CREATININE 0.97 02/09/2016 0439   CREATININE 1.11 02/08/2016 0449   CREATININE 1.0 10/27/2014 1004   CREATININE 1.1 10/04/2014 1352   GLUCOSE 96 02/09/2016 0439   GLUCOSE 83 02/08/2016 0449   GLUCOSE 77 10/27/2014 1004   GLUCOSE 78 10/04/2014 1352   CALCIUM 8.4 (L) 02/09/2016 0439   CALCIUM 8.5 (L) 02/08/2016 0449   CALCIUM 9.5 10/27/2014 1004   CALCIUM 9.3 10/04/2014 1352   AST 11 (L) 02/07/2016 1953   AST 12 (L) 02/07/2016 1147   AST 14 10/27/2014 1004   AST 22 10/04/2014 1352   ALT 10 (L) 02/07/2016 1953   ALT 11 (L) 02/07/2016 1147   ALT 12 10/27/2014 1004   ALT 26 10/04/2014 1352   ALKPHOS 50 02/07/2016 1953   ALKPHOS 57 02/07/2016 1147   ALKPHOS 78 10/27/2014 1004   ALKPHOS 85 10/04/2014 1352   BILITOT 0.5 02/07/2016 1953   BILITOT 0.6 02/07/2016 1147  BILITOT 0.44 10/27/2014 1004   BILITOT 0.30 10/04/2014 1352   PROT 6.8 02/07/2016 1953   PROT 7.4 02/07/2016 1147   PROT 6.7 10/27/2014 1004   PROT 7.1 10/04/2014 1352   ALBUMIN 3.9 02/07/2016 1953   ALBUMIN 4.2 02/07/2016 1147   ALBUMIN 3.9 10/27/2014 1004   ALBUMIN 4.1 10/04/2014 1352    Studies/Results: No results found.    Paul Brady M 02/10/2016  Patient ID: Lenny Pastel, male   DOB: 1955/12/12, 60 y.o.   MRN: [id number]

## 2016-02-11 DIAGNOSIS — R3914 Feeling of incomplete bladder emptying: Secondary | ICD-10-CM

## 2016-02-11 LAB — CBC
HCT: 33.8 % — ABNORMAL LOW (ref 39.0–52.0)
Hemoglobin: 11.4 g/dL — ABNORMAL LOW (ref 13.0–17.0)
MCH: 28.7 pg (ref 26.0–34.0)
MCHC: 33.7 g/dL (ref 30.0–36.0)
MCV: 85.1 fL (ref 78.0–100.0)
PLATELETS: 253 10*3/uL (ref 150–400)
RBC: 3.97 MIL/uL — AB (ref 4.22–5.81)
RDW: 14.3 % (ref 11.5–15.5)
WBC: 8.7 10*3/uL (ref 4.0–10.5)

## 2016-02-11 MED ORDER — OXYCODONE-ACETAMINOPHEN 5-325 MG PO TABS
1.0000 | ORAL_TABLET | ORAL | Status: DC | PRN
  Administered 2016-02-11 – 2016-02-12 (×2): 2 via ORAL
  Filled 2016-02-11 (×2): qty 2

## 2016-02-11 MED ORDER — AMLODIPINE BESYLATE 10 MG PO TABS
10.0000 mg | ORAL_TABLET | Freq: Every day | ORAL | Status: DC
  Administered 2016-02-12: 10 mg via ORAL
  Filled 2016-02-11: qty 1

## 2016-02-11 MED ORDER — BISACODYL 10 MG RE SUPP
10.0000 mg | RECTAL | Status: AC
  Administered 2016-02-11: 10 mg via RECTAL
  Filled 2016-02-11: qty 1

## 2016-02-11 NOTE — Progress Notes (Signed)
General Surgery [facility]  Assessment & Plan:  Acute diverticulitis with microperforation             On IV Zosyn             On full liquid diet - passing flatus, will advance to low residue diet             WBC normal at 8.7 this AM             Dietician to see patient and instruct in low residue diet  Dulcolax supp this AM for constipation - may repeat  Likely home 1-2 days if continued progress - will need oral abx for 7-10 days on discharge             Will follow with you        Earnstine Regal, MD, [facility]       Office: [phone]    Subjective: Patient ambulating in room, "getting cleaned up".  Passing flatus, no BM.  Objective: Vital signs in last 24 hours: Temp:  [98.6 F (37 C)-99.5 F (37.5 C)] 98.9 F (37.2 C) (12/03 0535) Pulse Rate:  [69-94] 69 (12/03 0535) Resp:  [16-18] 16 (12/03 0535) BP: (121-169)/(72-86) 121/72 (12/03 0535) SpO2:  [95 %-100 %] 100 % (12/03 0535) Last BM Date: 02/28/16  Intake/Output from previous day: 12/02 0701 - 12/03 0700 In: 3750 [P.O.:1300; I.V.:2400; IV Piggyback:50] Out: 2900 [Urine:2900] Intake/Output this shift: Total I/O In: 720 [P.O.:720] Out: 600 [Urine:600]  Physical Exam: HEENT - sclerae clear, mucous membranes moist Abdomen - softer, less distended, minimal tenderness Ext - no edema, non-tender Neuro - alert & oriented, no focal deficits  Lab Results:   Recent Labs  02/10/16 0506 02/11/16 0446  WBC 10.4 8.7  HGB 11.4* 11.4*  HCT 33.6* 33.8*  PLT 242 253   BMET  Recent Labs  02/09/16 0439  NA 140  K 3.6  CL 108  CO2 25  GLUCOSE 96  BUN 6  CREATININE 0.97  CALCIUM 8.4*   PT/INR No results for input(s): LABPROT, INR in the last 72 hours. Comprehensive Metabolic Panel:    Component Value Date/Time   NA 140 02/09/2016 0439   NA 139 02/08/2016 0449   NA 142 10/27/2014 1004   NA 144 10/04/2014 1352   K 3.6 02/09/2016 0439   K 3.8  02/08/2016 0449   K 3.7 10/27/2014 1004   K 4.3 10/04/2014 1352   CL 108 02/09/2016 0439   CL 106 02/08/2016 0449   CO2 25 02/09/2016 0439   CO2 27 02/08/2016 0449   CO2 25 10/27/2014 1004   CO2 28 10/04/2014 1352   BUN 6 02/09/2016 0439   BUN 9 02/08/2016 0449   BUN 14.6 10/27/2014 1004   BUN 11.6 10/04/2014 1352   CREATININE 0.97 02/09/2016 0439   CREATININE 1.11 02/08/2016 0449   CREATININE 1.0 10/27/2014 1004   CREATININE 1.1 10/04/2014 1352   GLUCOSE 96 02/09/2016 0439   GLUCOSE 83 02/08/2016 0449   GLUCOSE 77 10/27/2014 1004   GLUCOSE 78 10/04/2014 1352   CALCIUM 8.4 (L) 02/09/2016 0439   CALCIUM 8.5 (L) 02/08/2016 0449   CALCIUM 9.5 10/27/2014 1004   CALCIUM 9.3 10/04/2014 1352   AST 11 (L) 02/07/2016 1953   AST 12 (L) 02/07/2016 1147   AST 14 10/27/2014 1004   AST 22 10/04/2014 1352   ALT 10 (L) 02/07/2016 1953  ALT 11 (L) 02/07/2016 1147   ALT 12 10/27/2014 1004   ALT 26 10/04/2014 1352   ALKPHOS 50 02/07/2016 1953   ALKPHOS 57 02/07/2016 1147   ALKPHOS 78 10/27/2014 1004   ALKPHOS 85 10/04/2014 1352   BILITOT 0.5 02/07/2016 1953   BILITOT 0.6 02/07/2016 1147   BILITOT 0.44 10/27/2014 1004   BILITOT 0.30 10/04/2014 1352   PROT 6.8 02/07/2016 1953   PROT 7.4 02/07/2016 1147   PROT 6.7 10/27/2014 1004   PROT 7.1 10/04/2014 1352   ALBUMIN 3.9 02/07/2016 1953   ALBUMIN 4.2 02/07/2016 1147   ALBUMIN 3.9 10/27/2014 1004   ALBUMIN 4.1 10/04/2014 1352    Studies/Results: No results found.    Paul Brady M 02/11/2016  Patient ID: Paul Brady, male   DOB: [birth year], 60 y.o.   MRN: [id number]

## 2016-02-11 NOTE — Progress Notes (Signed)
[facility] PROGRESS NOTE  Paul Brady [id number] DOB: 1955-12-15 DOA: 02/07/2016 PCP: Philis Fendt, MD  Interim summary and HPI 60 y.o. male with PMH significant for  HTN, HLD, bipolar disorder and tobacco abuse; Who presented to ED secondary to abd pain and nausea. Patient was seen secondary to abd pain and pelvic pressure on 01/31/16, he reported some difficulty voiding and intermittent testicular pain at that time. Work up done and referred for outpatient urology follow up. Those symptoms have since then improved. Presented now with abd pain (LLQ, cramping, with radiation across lower abdomen and with associated bloating sensation and associated Nausea/constipaiton).  Patient deneis fever, but endorses chills. Also deneis CP, SOB, vomiting, hematuria, hematochezia, melena, focal weakness or any other complaints.  Assessment/Plan: 1-Diverticulitis of colon with perforation: -Patient continue to have nausea, abd pain and no BM's yet -Found to have acute diverticulitis with microperforation  -Will continue IV Zosyn (if diet tolerated will transitioned to augmentin and treat for 8 more days) and per surgery will advance diet to low residue -Continue supportive care, IVF's now change to saline lock -Will continue PRN antiemetics and analgesics -CT scan in the next 24-48 hours to follow clinical response.  2-HTN (hypertension): -Will continue amlodipine -PRN hydralazine   3-Hyperlipidemia: -lipid panel stable and not requiring statins at this time -LDL 95, TG, 98, HDL 41 and total Chol 156  4-Tobacco abuse: -Cessation counseling provided -Will continue nicotine patch   5-Depression and Anxiety: -No SI, and no hallucinations -Will resume Seroquel and bupropion today -PRN ativan will be provided for anxiety   6-Elevated lipase: -most likely acute phase reaction, as no signs of pancreatitis appreciated and not abnormalities seen on CT scan. -repeated lipase neg and  WNL  7-Leukocytosis -due to #1 -will continue IV antibiotics and IVF's -WBC's now WNL  8-BPH -started on flomax  Code Status: Full Family Communication: wife at bedside  Disposition Plan: Remains inpatient, continue IV antibiotics, continue supportive care and follow clinical response. Planning to advance diet and initiate treatment with dulcolax to assist with BM   Consultants:  CCS  Procedures:  See below for x-ray reports   Antibiotics:  Zosyn 11/29  HPI/Subjective: Continue to have abd pain, but much improved. Patient w/o N/V. Will like to have diet advance. Endorses some nocturia, decrease stream caliber/strenght and also sensation of incomplete bladder emptying. No fever. No BM's yet  Objective: Vitals:   02/11/16 0535 02/11/16 1333  BP: 121/72 (!) 158/94  Pulse: 69 78  Resp: 16 16  Temp: 98.9 F (37.2 C) 97.9 F (36.6 C)    Intake/Output Summary (Last 24 hours) at 02/11/16 1522 Last data filed at 02/11/16 1333  Gross per 24 hour  Intake             3400 ml  Output             2850 ml  Net              550 ml   Filed Weights   02/07/16 1810  Weight: 87.1 kg (192 lb 1.6 oz)    Exam:   General:  Afebrile, no CP and no SOB. Reports improvement in abd pain and denies any nausea or vomiting. No BM's  Cardiovascular: S1 and S2, no rubs, no gallops; no JVD  Respiratory: CTA bilaterally   Abdomen: soft, decrease BS, tender to palpation (mainly on LLQ)  Musculoskeletal: no edema, no cyanosis   Data Reviewed: Basic Metabolic Panel:  Recent Labs Lab  02/07/16 1147 02/07/16 1953 02/08/16 0449 02/09/16 0439  NA 138  --  139 140  K 4.0  --  3.8 3.6  CL 104  --  106 108  CO2 28  --  27 25  GLUCOSE 98  --  83 96  BUN 9  --  9 6  CREATININE 0.96  --  1.11 0.97  CALCIUM 9.2  --  8.5* 8.4*  MG  --  2.0  --   --   PHOS  --  3.0  --   --    Liver Function Tests:  Recent Labs Lab 02/07/16 1147 02/07/16 1953  AST 12* 11*  ALT 11* 10*   ALKPHOS 57 50  BILITOT 0.6 0.5  PROT 7.4 6.8  ALBUMIN 4.2 3.9    Recent Labs Lab 02/07/16 1147 02/08/16 0449  LIPASE 207* 18   CBC:  Recent Labs Lab 02/07/16 1147 02/08/16 0449 02/09/16 0439 02/10/16 0506 02/11/16 0446  WBC 12.8* 12.0* 10.3 10.4 8.7  HGB 13.7 12.6* 12.0* 11.4* 11.4*  HCT 41.1 38.3* 36.3* 33.6* 33.8*  MCV 86.3 87.2 85.6 84.8 85.1  PLT 283 263 250 242 253   Studies: No results found.  Scheduled Meds: . [START ON 02/12/2016] amLODipine  10 mg Oral Daily  . buPROPion  75 mg Oral QHS  . famotidine  20 mg Oral Daily  . heparin  5,000 Units Subcutaneous Q8H  . nicotine  14 mg Transdermal Daily  . piperacillin-tazobactam (ZOSYN)  IV  3.375 g Intravenous Q8H  . QUEtiapine  25 mg Oral BID  . QUEtiapine  400 mg Oral QHS  . tamsulosin  0.4 mg Oral QPC supper   Continuous Infusions:   Principal Problem:   Diverticulitis of colon with perforation Active Problems:   HTN (hypertension)   Hyperlipidemia   Tobacco abuse   Depression   Elevated lipase   Diverticulitis large intestine    Time spent: 25 minutes   Barton Dubois  [facility] Pager [phone]. If 7PM-7AM, please contact night-coverage at [email or web address], password [facility] 02/11/2016, 3:22 PM  LOS: 4 days

## 2016-02-12 DIAGNOSIS — K5720 Diverticulitis of large intestine with perforation and abscess without bleeding: Secondary | ICD-10-CM

## 2016-02-12 DIAGNOSIS — R3914 Feeling of incomplete bladder emptying: Secondary | ICD-10-CM

## 2016-02-12 DIAGNOSIS — N401 Enlarged prostate with lower urinary tract symptoms: Secondary | ICD-10-CM

## 2016-02-12 MED ORDER — AMLODIPINE BESYLATE 10 MG PO TABS: 10.0000 mg | ORAL_TABLET | Freq: Every day | ORAL | 1 refills | Status: DC

## 2016-02-12 MED ORDER — ACETAMINOPHEN 325 MG PO TABS: 650.0000 mg | ORAL_TABLET | Freq: Four times a day (QID) | ORAL | 0 refills | Status: DC | PRN

## 2016-02-12 MED ORDER — NICOTINE 14 MG/24HR TD PT24: 14.0000 mg | MEDICATED_PATCH | Freq: Every day | TRANSDERMAL | 0 refills | Status: DC

## 2016-02-12 MED ORDER — POLYETHYLENE GLYCOL 3350 17 G PO PACK: 17.0000 g | PACK | Freq: Every day | ORAL | 1 refills | Status: DC | PRN

## 2016-02-12 MED ORDER — AMOXICILLIN-POT CLAVULANATE 875-125 MG PO TABS: 1.0000 | ORAL_TABLET | Freq: Two times a day (BID) | ORAL | 0 refills | Status: DC

## 2016-02-12 MED ORDER — OXYCODONE-ACETAMINOPHEN 5-325 MG PO TABS: 1.0000 | ORAL_TABLET | Freq: Three times a day (TID) | ORAL | 0 refills | Status: DC | PRN

## 2016-02-12 MED ORDER — FAMOTIDINE 20 MG PO TABS: 20.0000 mg | ORAL_TABLET | Freq: Every day | ORAL | 1 refills | Status: DC

## 2016-02-12 MED ORDER — TAMSULOSIN HCL 0.4 MG PO CAPS: 0.4000 mg | ORAL_CAPSULE | Freq: Every day | ORAL | 1 refills | Status: DC

## 2016-02-12 NOTE — Discharge Summary (Signed)
Physician Discharge Summary  Paul Brady [id number] DOB: September 03, 1955 DOA: 02/07/2016  PCP: Philis Fendt, MD  Admit date: 02/07/2016 Discharge date: 02/12/2016  Time spent: 35 minutes  Recommendations for Outpatient Follow-up:  Repeat CBC to follow WBC's trend Repeat BMET to follow electrolytes and renal function Follow patient BPH symptoms and assure patient has follow with urology as instructed. Repeat CT scan in approx 2 weeks to follow up resolution of acute diverticular process. Arrange GI follow up in 4-6 weeks after resolution of acute process for colonoscopy.   Discharge Diagnoses:  Principal Problem:   Diverticulitis of colon with perforation Active Problems:   HTN (hypertension)   Hyperlipidemia   Tobacco abuse   Depression   Elevated lipase   Diverticulitis large intestine   Perforation of sigmoid colon due to diverticulitis   Benign prostatic hyperplasia with incomplete bladder emptying   Discharge Condition: stable and improved. Patient discharge home with instructions to follow up with PCP in 2 weeks. Advise to be compliant with medications ad low residue diet instructions.  Diet recommendation: heart healthy, low residue diet  Filed Weights   02/07/16 1810  Weight: 87.1 kg (192 lb 1.6 oz)    History of present illness:  60 y.o.malewith PMH significant for HTN, HLD, bipolar disorder and tobacco abuse; Who presented to ED secondary to abd pain and nausea. Patient was seen secondary to abd pain and pelvic pressure on 01/31/16, he reported some difficulty voiding and intermittent testicular pain at that time. Work up done and referred for outpatient urology follow up. Those symptoms have since then improved. Presented now with abd pain (LLQ, cramping, with radiation across lower abdomen and with associated bloating sensation and associated Nausea/constipaiton).  Patient deneis fever, but endorses chills. Also deneis CP, SOB, vomiting, hematuria,  hematochezia, melena, focal weakness or any other complaints.  Hospital Course:  1-Diverticulitis of colon with perforation: -Patient able to move his bowels, no longer experiencing nausea and with controlled abd pain. -discharge home with instructions for low residue diet, Augmentin for another 8 days and advise to maintain adequate hydration. -CT scan in 2 weeks to assess resolution of diverticular process and r/o abscess formation. -Colonoscopy in 4-6 weeks after resolution of acute process.  2-HTN (hypertension): -Will continue amlodipine -initiation of flomax also helping  3-Hyperlipidemia: -lipid panel stable and not requiring statins at this time -LDL 95, TG, 98, HDL 41 and total Chol 156 -advise to follow heart healthy diet   4-Tobacco abuse: -Cessation counseling provided -Will continue nicotine patch   5-Depression and Anxiety: -No SI, and no hallucinations -Will resume Seroquel and bupropion today -PRN ativan will be provided for anxiety   6-Elevated lipase: -most likely acute phase reaction, as no specific symptoms of pancreatitis appreciated and no abnormalities or pancreatitis Seen on CT scan. -repeated lipase prior to discharge WNL  7-Leukocytosis -due to #1 -will continue antibiotics as recommended by general surgery to complete therapy. -WBC's WNL at discharge  8-BPH -started on flomax -patient with upcoming appointment to be seen by urology  Procedures:  See below for x-ray reports   Consultations:  General surgery  Discharge Exam: Vitals:   02/11/16 2128 02/12/16 0601  BP: (!) 148/98 (!) 144/94  Pulse: 72 72  Resp: 16 15  Temp: 98.5 F (36.9 C) 98.2 F (36.8 C)    General:  Afebrile, no CP and no SOB. Reports significant improvement in abd pain and denies any nausea or vomiting. Positive BM overnight. Tolerating low residue diet and meds by  mouth at discharge  Cardiovascular: S1 and S2, no rubs, no gallops; no JVD  Respiratory:  CTA bilaterally   Abdomen: soft, positive BS, mild tenderness to palpation (mainly on LLQ), no guarding  Musculoskeletal: no edema, no cyanosis    Discharge Instructions   Discharge Instructions    Diet - low sodium heart healthy    Complete by:  As directed    Discharge instructions    Complete by:  As directed    Take medications as prescribed Maintain adequate hydration and follow low residue diet Please arrange follow up with PCP in 2 weeks   Increase activity slowly    Complete by:  As directed      Current Discharge Medication List    START taking these medications   Details  acetaminophen (TYLENOL) 325 MG tablet Take 2 tablets (650 mg total) by mouth every 6 (six) hours as needed for mild pain (or Fever >/= 101). Qty: 40 tablet, Refills: 0    amLODipine (NORVASC) 10 MG tablet Take 1 tablet (10 mg total) by mouth daily. Qty: 30 tablet, Refills: 1    amoxicillin-clavulanate (AUGMENTIN) 875-125 MG tablet Take 1 tablet by mouth 2 (two) times daily. Qty: 16 tablet, Refills: 0    famotidine (PEPCID) 20 MG tablet Take 1 tablet (20 mg total) by mouth daily. Qty: 30 tablet, Refills: 1    nicotine (NICODERM CQ - DOSED IN MG/24 HOURS) 14 mg/24hr patch Place 1 patch (14 mg total) onto the skin daily. Qty: 28 patch, Refills: 0    oxyCODONE-acetaminophen (PERCOCET/ROXICET) 5-325 MG tablet Take 1 tablet by mouth every 8 (eight) hours as needed for severe pain. Qty: 30 tablet, Refills: 0    polyethylene glycol (MIRALAX) packet Take 17 g by mouth daily as needed for mild constipation. Qty: 28 each, Refills: 1    tamsulosin (FLOMAX) 0.4 MG CAPS capsule Take 1 capsule (0.4 mg total) by mouth daily after supper. Qty: 30 capsule, Refills: 1      CONTINUE these medications which have NOT CHANGED   Details  ALPRAZolam (XANAX) 0.25 MG tablet Take 0.25 mg by mouth 2 (two) times daily as needed for anxiety (anxiety).     buPROPion (WELLBUTRIN) 75 MG tablet Take 75 mg by mouth at  bedtime.  Refills: 0    !! QUEtiapine (SEROQUEL) 25 MG tablet Take 25 mg by mouth 2 (two) times daily. Refills: 0    !! QUEtiapine (SEROQUEL) 400 MG tablet Take 400 mg by mouth at bedtime.     nitroGLYCERIN (NITROSTAT) 0.4 MG SL tablet Place 0.4 mg under the tongue every 5 (five) minutes as needed for chest pain (chest pain).      !! - Potential duplicate medications found. Please discuss with provider.     Allergies  Allergen Reactions  . Mold Extract [Trichophyton] Other (See Comments)    Per Allergy testing - extreme mold allergy even when on foods.   . Trazodone And Nefazodone     priapism   Follow-up Information    Philis Fendt, MD. Schedule an appointment as soon as possible for a visit in 2 week(s).   Specialty:  Internal Medicine Contact information: [address] [place] [address] [phone]           The results of significant diagnostics from this hospitalization (including imaging, microbiology, ancillary and laboratory) are listed below for reference.    Significant Diagnostic Studies: Dg Shoulder Right  Result Date: 01/31/2016 CLINICAL DATA:  Right shoulder pain after a  lifting injury. EXAM: RIGHT SHOULDER - 2+ VIEW COMPARISON:  None. FINDINGS: There is slight arthritis of the acromioclavicular joint. Glenohumeral joint appears normal. No abnormal soft tissue calcifications. IMPRESSION: No acute abnormality.  Slight arthritis of the [facility] joint. Electronically Signed   By: Lorriane Shire M.D.   On: 01/31/2016 18:39   US Scrotum  Result Date: 01/31/2016 CLINICAL DATA:  Left testicle pain for 3 days. EXAM: SCROTAL ULTRASOUND DOPPLER ULTRASOUND OF THE TESTICLES TECHNIQUE: Complete ultrasound examination of the testicles, epididymis, and other scrotal structures was performed. Color and spectral Doppler ultrasound were also utilized to evaluate blood flow to the testicles. COMPARISON:  None. FINDINGS: Pulsed Doppler interrogation of both testes  demonstrates normal low resistance arterial and venous waveforms bilaterally. Measurements: 3.7 x 1.6 x 3.0 cm. No mass or microlithiasis visualized. Left testicle Measurements: 4.8 x 2.2 x 1.3 cm. No mass or microlithiasis visualized. Right epididymis:  Normal in size and appearance. Left epididymis: 1 cm simple appearing epididymal cyst. Otherwise normal. Hydrocele:  None visualized. Varicocele:  Small bilateral varicoceles. IMPRESSION: Small bilateral varicoceles. Small epididymal cyst on the left. Normal perfusion to both testicles. Electronically Signed   By: Lorriane Shire M.D.   On: 01/31/2016 21:57   Ct Abdomen Pelvis W Contrast  Result Date: 02/07/2016 CLINICAL DATA:  Acute lower abdominal pain. EXAM: CT ABDOMEN AND PELVIS WITH CONTRAST TECHNIQUE: Multidetector CT imaging of the abdomen and pelvis was performed using the standard protocol following bolus administration of intravenous contrast. CONTRAST:  171mL ISOVUE-300 IOPAMIDOL (ISOVUE-300) INJECTION 61% COMPARISON:  CT scan of September 27, 2014. FINDINGS: Lower chest: No acute abnormality. Hepatobiliary: No focal liver abnormality is seen. No gallstones, gallbladder wall thickening, or biliary dilatation. Pancreas: Unremarkable. No pancreatic ductal dilatation or surrounding inflammatory changes. Spleen: Normal in size without focal abnormality. Adrenals/Urinary Tract: Adrenal glands are unremarkable. Kidneys are normal, without renal calculi, focal lesion, or hydronephrosis. Bladder is unremarkable. Stomach/Bowel: The appendix appears normal. Focal diverticulitis of proximal sigmoid colon is noted with small amount of extraluminal gas suggesting perforation. There is no evidence of bowel obstruction. Vascular/Lymphatic: Aortic atherosclerosis. No enlarged abdominal or pelvic lymph nodes. Reproductive: Stable mild prostatic enlargement is noted. Other: No abdominal wall hernia or abnormality. No abdominopelvic ascites. Musculoskeletal: Stable small  lucencies are noted in the lower lumbar spine and pelvis. Moderate degenerative disc disease is noted at L3-4. IMPRESSION: Aortic atherosclerosis. Stable mild prostatic enlargement. Stable small lucencies are noted in the lower lumbar spine and pelvis. These most likely benign in etiology, but myeloma cannot be excluded. Focal sigmoid diverticulitis is noted with small amount of extra luminal gas adjacent to sigmoid colon suggesting perforation. Electronically Signed   By: Marijo Conception, M.D.   On: 02/07/2016 15:43   [place] Art/ven Flow Abd Pelv Doppler  Result Date: 01/31/2016 CLINICAL DATA:  Left testicle pain for 3 days. EXAM: SCROTAL ULTRASOUND DOPPLER ULTRASOUND OF THE TESTICLES TECHNIQUE: Complete ultrasound examination of the testicles, epididymis, and other scrotal structures was performed. Color and spectral Doppler ultrasound were also utilized to evaluate blood flow to the testicles. COMPARISON:  None. FINDINGS: Pulsed Doppler interrogation of both testes demonstrates normal low resistance arterial and venous waveforms bilaterally. Measurements: 3.7 x 1.6 x 3.0 cm. No mass or microlithiasis visualized. Left testicle Measurements: 4.8 x 2.2 x 1.3 cm. No mass or microlithiasis visualized. Right epididymis:  Normal in size and appearance. Left epididymis: 1 cm simple appearing epididymal cyst. Otherwise normal. Hydrocele:  None visualized. Varicocele:  Small bilateral varicoceles. IMPRESSION:  Small bilateral varicoceles. Small epididymal cyst on the left. Normal perfusion to both testicles. Electronically Signed   By: Lorriane Shire M.D.   On: 01/31/2016 21:57    Microbiology: No results found for this or any previous visit (from the past 240 hour(s)).   Labs: Basic Metabolic Panel:  Recent Labs Lab 02/07/16 1147 02/07/16 1953 02/08/16 0449 02/09/16 0439  NA 138  --  139 140  K 4.0  --  3.8 3.6  CL 104  --  106 108  CO2 28  --  27 25  GLUCOSE 98  --  83 96  BUN 9  --  9 6  CREATININE  0.96  --  1.11 0.97  CALCIUM 9.2  --  8.5* 8.4*  MG  --  2.0  --   --   PHOS  --  3.0  --   --    Liver Function Tests:  Recent Labs Lab 02/07/16 1147 02/07/16 1953  AST 12* 11*  ALT 11* 10*  ALKPHOS 57 50  BILITOT 0.6 0.5  PROT 7.4 6.8  ALBUMIN 4.2 3.9    Recent Labs Lab 02/07/16 1147 02/08/16 0449  LIPASE 207* 18   CBC:  Recent Labs Lab 02/07/16 1147 02/08/16 0449 02/09/16 0439 02/10/16 0506 02/11/16 0446  WBC 12.8* 12.0* 10.3 10.4 8.7  HGB 13.7 12.6* 12.0* 11.4* 11.4*  HCT 41.1 38.3* 36.3* 33.6* 33.8*  MCV 86.3 87.2 85.6 84.8 85.1  PLT 283 263 250 242 253    Signed:  Barton Dubois MD.  [facility] 02/12/2016, 12:16 PM

## 2016-02-12 NOTE — Discharge Instructions (Signed)
Low-Fiber Diet °Fiber is found in fruits, vegetables, and whole grains. A low-fiber diet restricts fibrous foods that are not digested in the small intestine. A diet containing about 10-15 grams of fiber per day is considered low fiber. Low-fiber diets may be used to: °· Promote healing and rest the bowel during intestinal flare-ups. °· Prevent blockage of a partially obstructed or narrowed gastrointestinal tract. °· Reduce fecal weight and volume. °· Slow the movement of feces. ° °You may be on a low-fiber diet as a transitional diet following surgery, after an injury (trauma), or because of a short (acute) or lifelong (chronic) illness. Your health care provider will determine the length of time you need to stay on this diet. °What do I need to know about a low-fiber diet? °Always check the fiber content on the packaging's Nutrition Facts label, especially on foods from the grains list. Ask your dietitian if you have questions about specific foods that are related to your condition, especially if the food is not listed below. In general, a low-fiber food will have less than 2 g of fiber. °What foods can I eat? °Grains °All breads and crackers made with white flour. Sweet rolls, doughnuts, waffles, pancakes, French toast, bagels. Pretzels, Melba toast, zwieback. Well-cooked cereals, such as cornmeal, farina, or cream cereals. Dry cereals that do not contain whole grains, fruit, or nuts, such as refined corn, wheat, rice, and oat cereals. Potatoes prepared any way without skins, plain pastas and noodles, refined white rice. Use white flour for baking and making sauces. Use allowed list of grains for casseroles, dumplings, and puddings. °Vegetables °Strained tomato and vegetable juices. Fresh lettuce, cucumber, spinach. Well-cooked (no skin or pulp) or canned vegetables, such as asparagus, bean sprouts, beets, carrots, green beans, mushrooms, potatoes, pumpkin, spinach, yellow squash, tomato sauce/puree, turnips,  yams, and zucchini. Keep servings limited to ½ cup. °Fruits °All fruit juices except prune juice. Cooked or canned fruits without skin and seeds, such as applesauce, apricots, cherries, fruit cocktail, grapefruit, grapes, mandarin oranges, melons, peaches, pears, pineapple, and plums. Fresh fruits without skin, such as apricots, avocados, bananas, melons, pineapple, nectarines, and peaches. Keep servings limited to ½ cup or 1 piece. °Meat and Other Protein Sources °Ground or well-cooked tender beef, ham, veal, lamb, pork, or poultry. Eggs, plain cheese. Fish, oysters, shrimp, lobster, and other seafood. Liver, organ meats. Smooth nut butters. °Dairy °All milk products and alternative dairy substitutes, such as soy, rice, almond, and coconut, not containing added whole nuts, seeds, or added fruit. °Beverages °Decaf coffee, fruit, and vegetable juices or smoothies (small amounts, with no pulp or skins, and with fruits from allowed list), sports drinks, herbal tea. °Condiments °Ketchup, mustard, vinegar, cream sauce, cheese sauce, cocoa powder. Spices in moderation, such as allspice, basil, bay leaves, celery powder or leaves, cinnamon, cumin powder, curry powder, ginger, mace, marjoram, onion or garlic powder, oregano, paprika, parsley flakes, ground pepper, rosemary, sage, savory, tarragon, thyme, and turmeric. °Sweets and Desserts °Plain cakes and cookies, pie made with allowed fruit, pudding, custard, cream pie. Gelatin, fruit, ice, sherbet, frozen ice pops. Ice cream, ice milk without nuts. Plain hard candy, honey, jelly, molasses, syrup, sugar, chocolate syrup, gumdrops, marshmallows. Limit overall sugar intake. °Fats and Oil °Margarine, butter, cream, mayonnaise, salad oils, plain salad dressings made from allowed foods. Choose healthy fats such as olive oil, canola oil, and omega-3 fatty acids (such as found in salmon or tuna) when possible. °Other °Bouillon, broth, or cream soups made from allowed foods. Any    strained soup. Casseroles or mixed dishes made with allowed foods. °The items listed above may not be a complete list of recommended foods or beverages. Contact your dietitian for more options. °What foods are not recommended? °Grains °All whole wheat and whole grain breads and crackers. Multigrains, rye, bran seeds, nuts, or coconut. Cereals containing whole grains, multigrains, bran, coconut, nuts, raisins. Cooked or dry oatmeal, steel-cut oats. Coarse wheat cereals, granola. Cereals advertised as high fiber. Potato skins. Whole grain pasta, wild or brown rice. Popcorn. Coconut flour. Bran, buckwheat, corn bread, multigrains, rye, wheat germ. °Vegetables °Fresh, cooked or canned vegetables, such as artichokes, asparagus, beet greens, broccoli, Brussels sprouts, cabbage, celery, cauliflower, corn, eggplant, kale, legumes or beans, okra, peas, and tomatoes. Avoid large servings of any vegetables, especially raw vegetables. °Fruits °Fresh fruits, such as apples with or without skin, berries, cherries, figs, grapes, grapefruit, guavas, kiwis, mangoes, oranges, papayas, pears, persimmons, pineapple, and pomegranate. Prune juice and juices with pulp, stewed or dried prunes. Dried fruits, dates, raisins. Fruit seeds or skins. Avoid large servings of all fresh fruits. °Meats and Other Protein Sources °Tough, fibrous meats with gristle. Chunky nut butter. Cheese made with seeds, nuts, or other foods not recommended. Nuts, seeds, legumes (beans, including baked beans), dried peas, beans, lentils. °Dairy °Yogurt or cheese that contains nuts, seeds, or added fruit. °Beverages °Fruit juices with high pulp, prune juice. Caffeinated coffee and teas. °Condiments °Coconut, maple syrup, pickles, olives. °Sweets and Desserts °Desserts, cookies, or candies that contain nuts or coconut, chunky peanut butter, dried fruits. Jams, preserves with seeds, marmalade. Large amounts of sugar and sweets. Any other dessert made with fruits from  the not recommended list. °Other °Soups made from vegetables that are not recommended or that contain other foods not recommended. °The items listed above may not be a complete list of foods and beverages to avoid. Contact your dietitian for more information. °This information is not intended to replace advice given to you by your health care provider. Make sure you discuss any questions you have with your health care provider. °Document Released: 08/17/2001 Document Revised: 08/03/2015 Document Reviewed: 01/18/2013 °Elsevier Interactive Patient Education © 2017 [employer] Inc[employer] ° °

## 2016-02-12 NOTE — Care Management Important Message (Signed)
Important Message  Patient Details  Name: Cambron Chatham MRN: [id number] Date of Birth: May 31, 1955   Medicare Important Message Given:  Yes    Camillo Flaming 02/12/2016, 10:41 AMImportant Message  Patient Details  Name: Talmage Horry MRN: [id number] Date of Birth: 10/20/1955   Medicare Important Message Given:  Yes    Camillo Flaming 02/12/2016, 10:41 AM

## 2016-02-12 NOTE — Progress Notes (Signed)
[facility] Progress Note     Subjective: Had a small BM yesterday after warm prune juice. Abdominal pain improving - still mild pain in LLQ. Tolerating PO without N/V.  Objective: Vital signs in last 24 hours: Temp:  [97.9 F (36.6 C)-98.5 F (36.9 C)] 98.2 F (36.8 C) (12/04 0601) Pulse Rate:  [72-78] 72 (12/04 0601) Resp:  [15-16] 15 (12/04 0601) BP: (144-158)/(94-98) 144/94 (12/04 0601) SpO2:  [100 %] 100 % (12/04 0601) Last BM Date: 02/11/16  Intake/Output from previous day: 12/03 0701 - 12/04 0700 In: 1600 [P.O.:1500; IV Piggyback:100] Out: 1700 [Urine:1700] Intake/Output this shift: No intake/output data recorded.  PE: Gen:  Alert, NAD, pleasant Pulm:  CTA, no W/R/R Abd: Soft, tender to deep palpation LLQ, ND, +BS Ext:  No erythema, edema, or tenderness   Lab Results:   Recent Labs  02/10/16 0506 02/11/16 0446  WBC 10.4 8.7  HGB 11.4* 11.4*  HCT 33.6* 33.8*  PLT 242 253   CMP     Component Value Date/Time   NA 140 02/09/2016 0439   NA 142 10/27/2014 1004   K 3.6 02/09/2016 0439   K 3.7 10/27/2014 1004   CL 108 02/09/2016 0439   CO2 25 02/09/2016 0439   CO2 25 10/27/2014 1004   GLUCOSE 96 02/09/2016 0439   GLUCOSE 77 10/27/2014 1004   BUN 6 02/09/2016 0439   BUN 14.6 10/27/2014 1004   CREATININE 0.97 02/09/2016 0439   CREATININE 1.0 10/27/2014 1004   CALCIUM 8.4 (L) 02/09/2016 0439   CALCIUM 9.5 10/27/2014 1004   PROT 6.8 02/07/2016 1953   PROT 6.7 10/27/2014 1004   ALBUMIN 3.9 02/07/2016 1953   ALBUMIN 3.9 10/27/2014 1004   AST 11 (L) 02/07/2016 1953   AST 14 10/27/2014 1004   ALT 10 (L) 02/07/2016 1953   ALT 12 10/27/2014 1004   ALKPHOS 50 02/07/2016 1953   ALKPHOS 78 10/27/2014 1004   BILITOT 0.5 02/07/2016 1953   BILITOT 0.44 10/27/2014 1004   GFRNONAA >60 02/09/2016 0439   GFRAA >60 02/09/2016 0439   Lipase     Component Value Date/Time   LIPASE 18 02/08/2016 0449    Anti-infectives: Anti-infectives    Start      Dose/Rate Route Frequency Ordered Stop   02/08/16 0000  Ampicillin-Sulbactam (UNASYN) 3 g in sodium chloride 0.9 % 100 mL IVPB  Status:  Discontinued     3 g 200 mL/hr over 30 Minutes Intravenous Every 8 hours 02/07/16 1846 02/08/16 0836   02/07/16 2200  piperacillin-tazobactam (ZOSYN) IVPB 3.375 g     3.375 g 12.5 mL/hr over 240 Minutes Intravenous Every 8 hours 02/07/16 1906     02/07/16 1630  Ampicillin-Sulbactam (UNASYN) 3 g in sodium chloride 0.9 % 100 mL IVPB     3 g 200 mL/hr over 30 Minutes Intravenous  Once 02/07/16 1617 02/07/16 1744   02/07/16 1615  piperacillin-tazobactam (ZOSYN) IVPB 3.375 g  Status:  Discontinued     3.375 g 12.5 mL/hr over 240 Minutes Intravenous Every 6 hours 02/07/16 1603 02/07/16 1617     Assessment/Plan Acute diverticulitis with microperforation On IV Zosyn On regular diet - encourage low residue  WBC normal at 8.7             Dulcolax supp this AM for constipation - may repeat             Stable for discharge from a surgical standpoint - will need oral abx for 7-10 days on discharge  Will follow with you    LOS: 5 days    Jill Alexanders , [facility] 02/12/2016, 9:54 AM Pager: [phone] Consults: [phone] Mon-Fri 7:00 am-4:30 pm Sat-Sun 7:00 am-11:30 am

## 2016-02-12 NOTE — Plan of Care (Signed)
Problem: Food- and Nutrition-Related Knowledge Deficit (NB-1.1) Goal: Nutrition education Formal process to instruct or train a patient/client in a skill or to impart knowledge to help patients/clients voluntarily manage or modify food choices and eating behavior to maintain or improve health. Outcome: Completed/Met Date Met: 02/12/16 Nutrition Education Note  RD consulted for nutrition education regarding a low fiber diet for diverticulitis.  RD provided "Fiber Restricted Nutrition Therapy" handout from the Academy of [employer]. Reviewed patient's dietary recall. Provided examples of low and high fiber foods. Discouraged intake of high fiber foods, processed foods, caffeine and red meats. Encouraged use of a multi-vitamin while following a low fiber diet. Teach back method used.  Expect good compliance.  Body mass index is 24.66 kg/m. Pt meets criteria for normal range based on current BMI.  Current diet order is Regular diet -low residue, patient is consuming approximately 100% of meals at this time. Labs and medications reviewed. No further nutrition interventions warranted at this time. If additional nutrition issues arise, please re-consult RD.   , MS, RD, LDN Pager: [phone] After Hours Pager: [phone]     

## 2016-04-10 IMAGING — MR MR HEAD W/O CM
9 of 12 series · 33 of 48 positions shown · non-contrast
Comparison: None.

CLINICAL DATA: Left lower extremity numbness, tingling, and
weakness beginning this morning and worsening. Evaluate for possible
stroke.

EXAM:
MRI HEAD WITHOUT CONTRAST
TECHNIQUE: Multiplanar, multiecho pulse sequences of the brain and surrounding
structures were obtained without intravenous contrast.

[Series 3: DWI · axial · 5.0mm · 1.09mm/px · z∈[-45,+100]mm · 7 of 60 slices shown (1 of 4)]
[im 1/60]
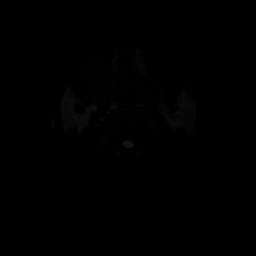
[im 10/60]
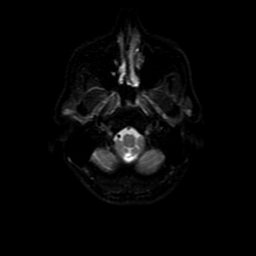
[im 20/60]
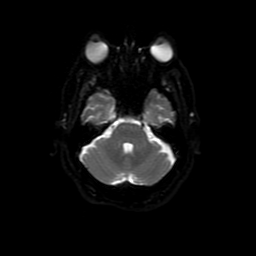
[im 30/60]
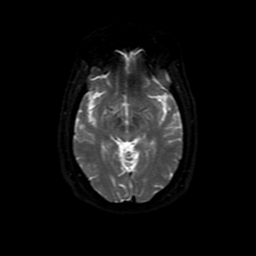
[im 40/60]
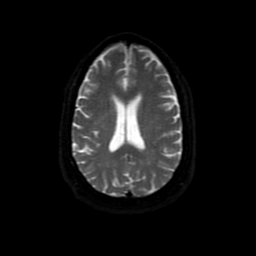
[im 50/60]
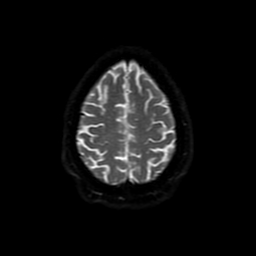
[im 60/60]
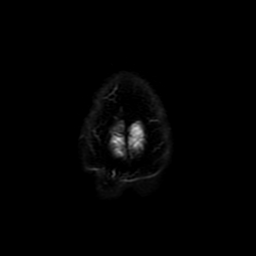

[Series 5: DWI · coronal · 5.0mm · 1.09mm/px · 7 of 60 slices shown (2 of 4)]
[im 1/60]
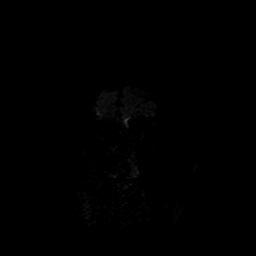
[im 10/60]
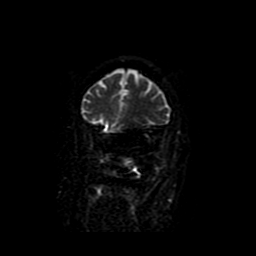
[im 20/60]
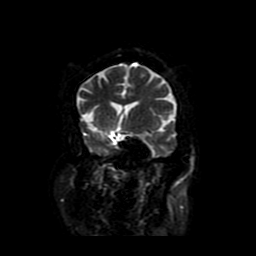
[im 30/60]
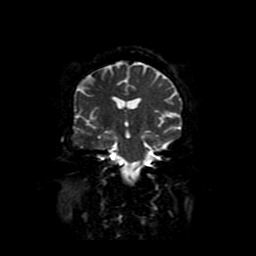
[im 40/60]
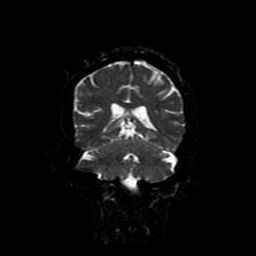
[im 50/60]
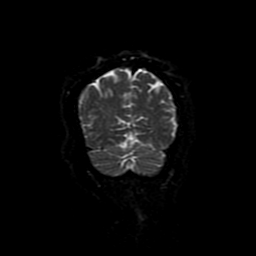
[im 60/60]
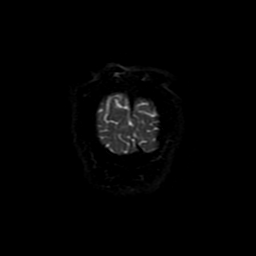

[Series 6: T2 · axial · 5.0mm · 0.43mm/px · z∈[-58,+91]mm · 3 of 24 slices shown (1 of 4)]
[im 1/24]
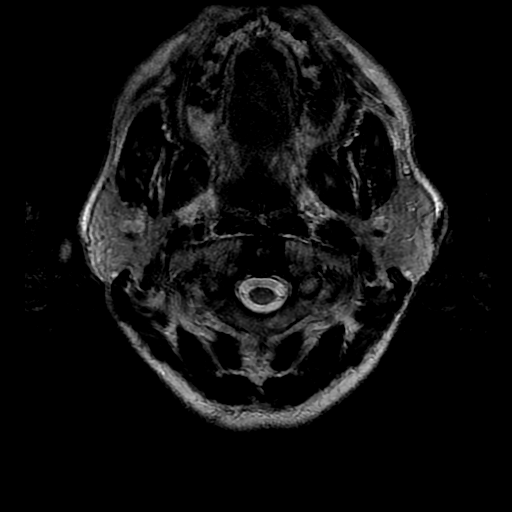
[im 12/24]
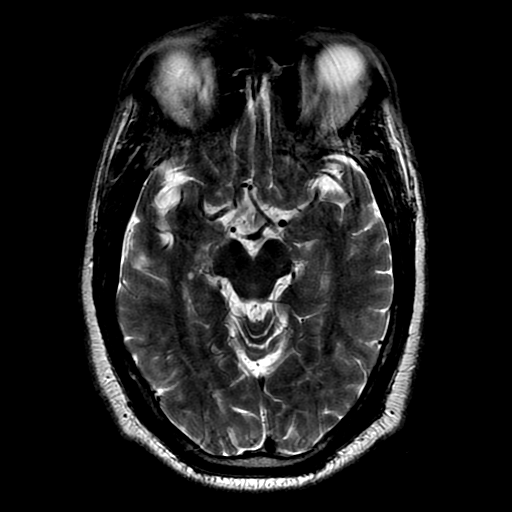
[im 24/24]
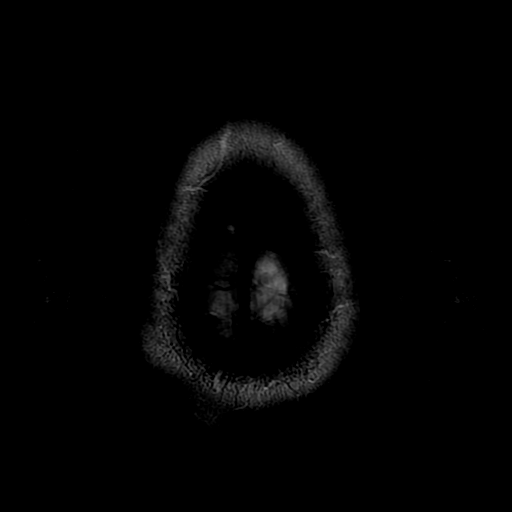

[Series 7: T2 · axial · 5.0mm · 0.43mm/px · z∈[-65,+97]mm · 3 of 28 slices shown (2 of 4)]
[im 1/28]
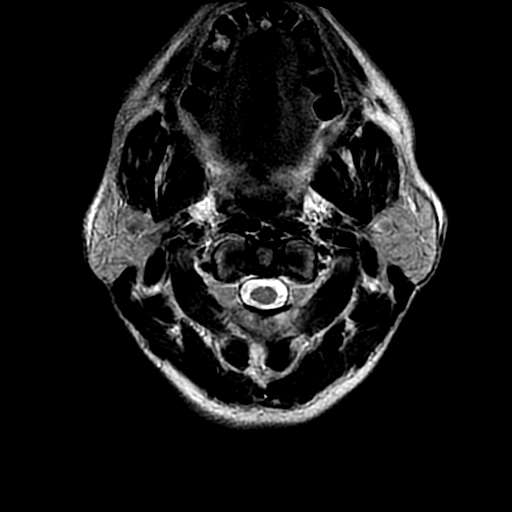
[im 14/28]
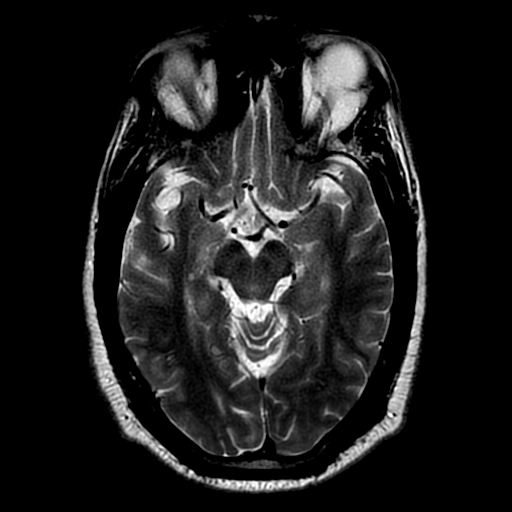
[im 28/28]
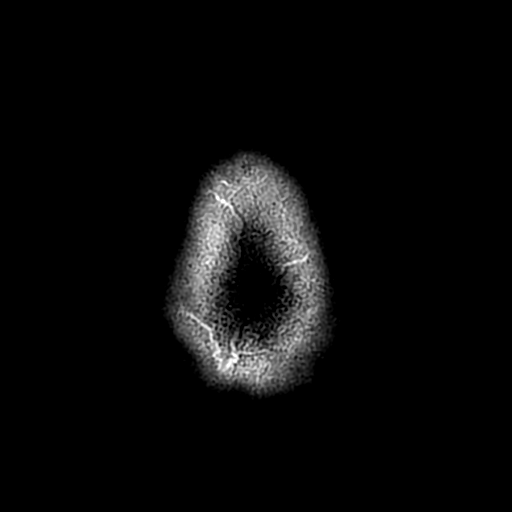

[Series 8: FLAIR · axial · 5.0mm · 0.43mm/px · z∈[-65,+97]mm · 3 of 28 slices shown]
[im 1/28]
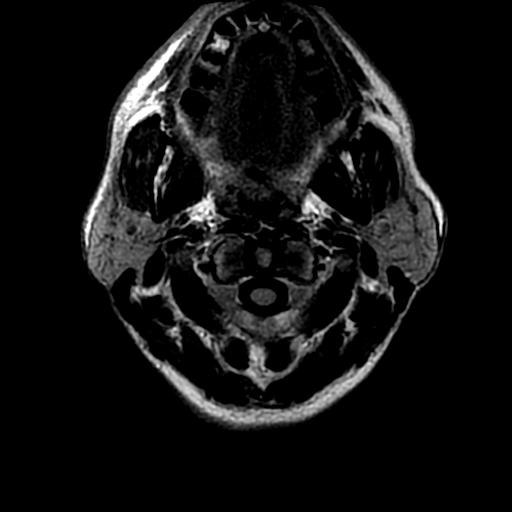
[im 14/28]
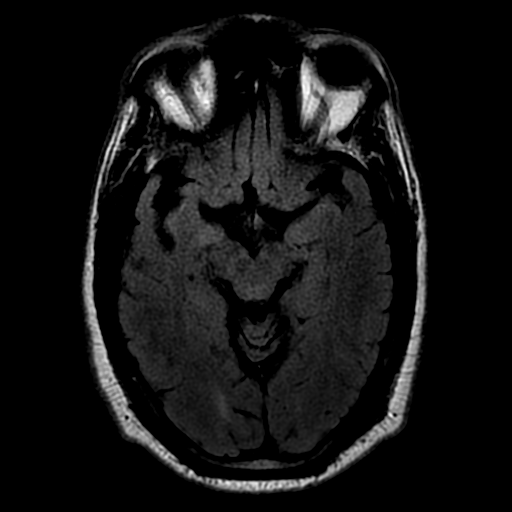
[im 28/28]
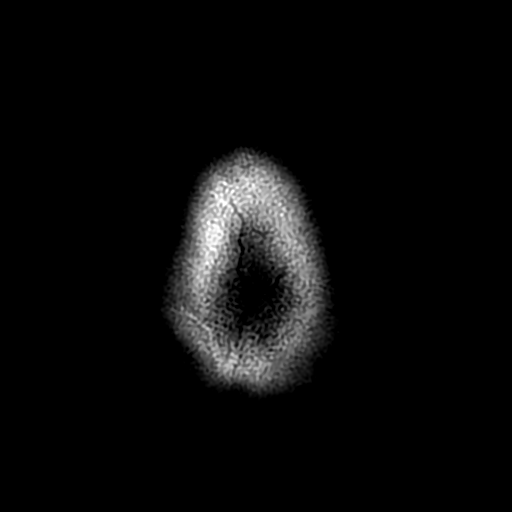

[Series 11: T2 · coronal · 5.0mm · 0.47mm/px · 3 of 24 slices shown (3 of 4)]
[im 1/24]
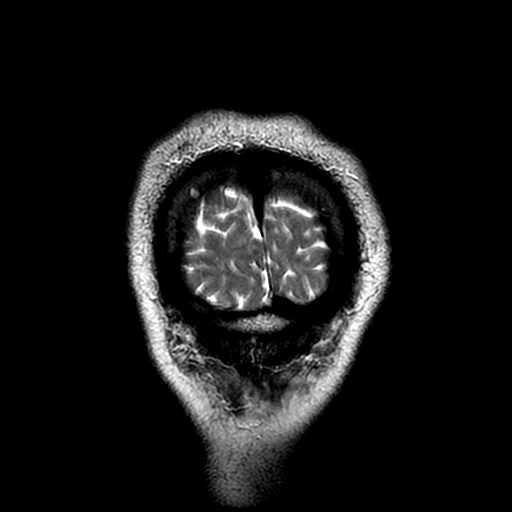
[im 12/24]
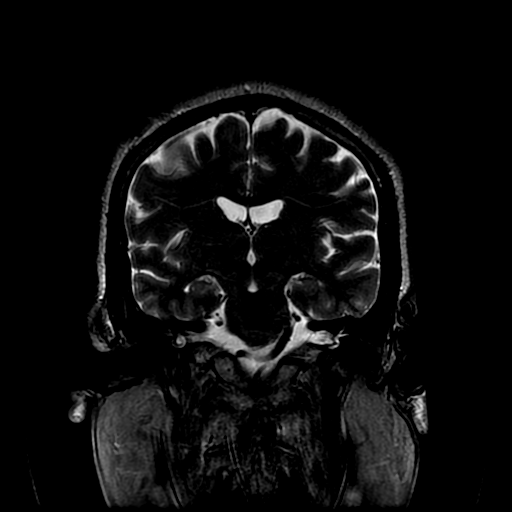
[im 24/24]
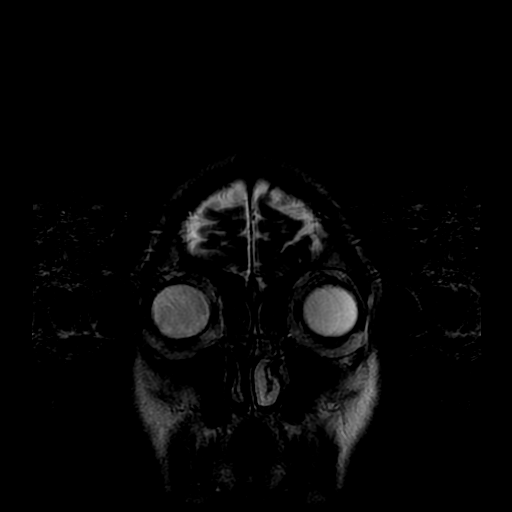

[Series 12: T2 · coronal · 5.0mm · 0.47mm/px · 1 of 24 slices shown (4 of 4)]
[im 1/24]
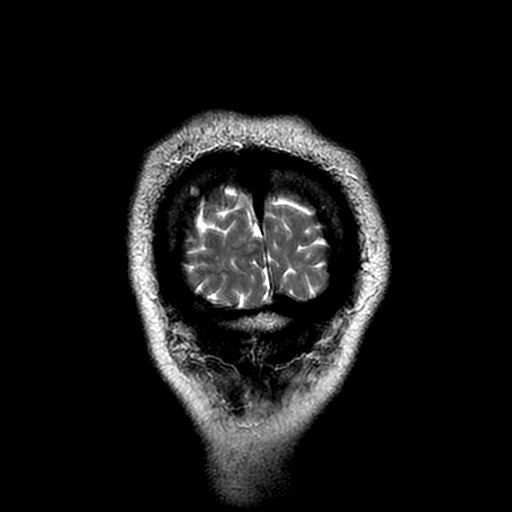

[Series 300: DWI · axial · 5.0mm · 1.09mm/px · z∈[-45,+100]mm · 3 of 30 slices shown (3 of 4)]
[im 1/30]
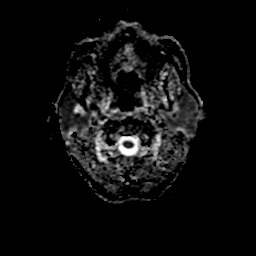
[im 15/30]
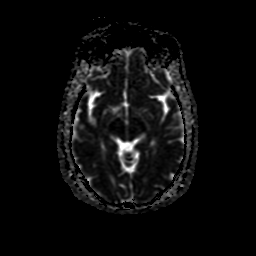
[im 30/30]
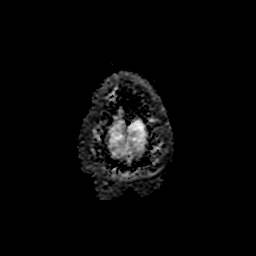

[Series 500: DWI · coronal · 5.0mm · 1.09mm/px · 3 of 30 slices shown (4 of 4)]
[im 1/30]
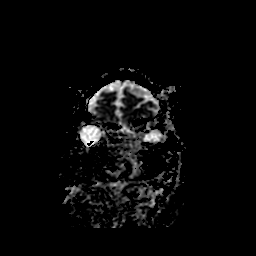
[im 15/30]
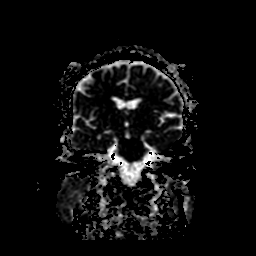
[im 30/30]
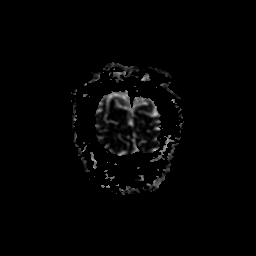

[33 of 48 positions shown; findings below may reference images not displayed]

FINDINGS: Images are mildly degraded by motion. Faster, motion resistant
sequences were used, with some sequences being repeated.

There is no evidence of acute infarct, intracranial hemorrhage,
midline shift, or extra-axial fluid collection. There is mild,
age-related cerebral atrophy. There is no significant white matter
disease for patient's age. 1 cm pineal cyst is noted.

Abnormal contour of the right lobe posteriorly may reflect a
staphyloma. Tiny right maxillary sinus mucous retention cyst is
noted. Mastoid air cells are clear. Major intracranial vascular flow
voids are preserved.
IMPRESSION: No evidence of infarct or other acute intracranial abnormality.

## 2016-05-03 ENCOUNTER — Emergency Department (HOSPITAL_COMMUNITY)
Admission: EM | Admit: 2016-05-03 | Discharge: 2016-05-03 | Disposition: A | Discharge status: Home / Self Care | Payer: Medicare HMO | Attending: Emergency Medicine | Admitting: Emergency Medicine

## 2016-05-03 ENCOUNTER — Emergency Department (HOSPITAL_COMMUNITY): Payer: Medicare HMO

## 2016-05-03 ENCOUNTER — Encounter (HOSPITAL_COMMUNITY): Payer: Self-pay

## 2016-05-03 DIAGNOSIS — R1032 Left lower quadrant pain: Secondary | ICD-10-CM | POA: Diagnosis not present

## 2016-05-03 DIAGNOSIS — I10 Essential (primary) hypertension: Secondary | ICD-10-CM | POA: Insufficient documentation

## 2016-05-03 DIAGNOSIS — F17210 Nicotine dependence, cigarettes, uncomplicated: Secondary | ICD-10-CM | POA: Insufficient documentation

## 2016-05-03 DIAGNOSIS — R05 Cough: Secondary | ICD-10-CM

## 2016-05-03 DIAGNOSIS — Z79899 Other long term (current) drug therapy: Secondary | ICD-10-CM | POA: Diagnosis not present

## 2016-05-03 DIAGNOSIS — R059 Cough, unspecified: Secondary | ICD-10-CM

## 2016-05-03 LAB — COMPREHENSIVE METABOLIC PANEL
ALK PHOS: 63 U/L (ref 38–126)
ALT: 15 U/L — ABNORMAL LOW (ref 17–63)
ANION GAP: 6 (ref 5–15)
AST: 18 U/L (ref 15–41)
Albumin: 4.3 g/dL (ref 3.5–5.0)
BUN: 9 mg/dL (ref 6–20)
CALCIUM: 9.5 mg/dL (ref 8.9–10.3)
CHLORIDE: 106 mmol/L (ref 101–111)
CO2: 28 mmol/L (ref 22–32)
Creatinine, Ser: 0.88 mg/dL (ref 0.61–1.24)
GFR calc Af Amer: >60 mL/min (ref >60)
GFR calc non Af Amer: >60 mL/min (ref >60)
Glucose, Bld: 98 mg/dL (ref 65–99)
Potassium: 4.1 mmol/L (ref 3.5–5.1)
SODIUM: 140 mmol/L (ref 135–145)
Total Bilirubin: 0.7 mg/dL (ref 0.3–1.2)
Total Protein: 7.7 g/dL (ref 6.5–8.1)

## 2016-05-03 LAB — CBC
HCT: 39 % (ref 39.0–52.0)
HEMOGLOBIN: 13.4 g/dL (ref 13.0–17.0)
MCH: 28.8 pg (ref 26.0–34.0)
MCHC: 34.4 g/dL (ref 30.0–36.0)
MCV: 83.9 fL (ref 78.0–100.0)
Platelets: 298 10*3/uL (ref 150–400)
RBC: 4.65 MIL/uL (ref 4.22–5.81)
RDW: 15 % (ref 11.5–15.5)
WBC: 9.7 10*3/uL (ref 4.0–10.5)

## 2016-05-03 LAB — URINALYSIS, ROUTINE W REFLEX MICROSCOPIC
Bilirubin Urine: NEGATIVE
Glucose, UA: NEGATIVE mg/dL
HGB URINE DIPSTICK: NEGATIVE
Ketones, ur: NEGATIVE mg/dL
Leukocytes, UA: NEGATIVE
Nitrite: NEGATIVE
Protein, ur: NEGATIVE mg/dL
SPECIFIC GRAVITY, URINE: 1.017 (ref 1.005–1.030)
pH: 6 (ref 5.0–8.0)

## 2016-05-03 LAB — LIPASE, BLOOD: LIPASE: 83 U/L — AB (ref 11–51)

## 2016-05-03 MED ORDER — IOPAMIDOL (ISOVUE-300) INJECTION 61%
100.0000 mL | Freq: Once | INTRAVENOUS | Status: AC | PRN
  Administered 2016-05-03: 100 mL via INTRAVENOUS

## 2016-05-03 MED ORDER — IOPAMIDOL (ISOVUE-370) INJECTION 76%
INTRAVENOUS | Status: AC
  Filled 2016-05-03: qty 100

## 2016-05-03 MED ORDER — BENZONATATE 100 MG PO CAPS
100.0000 mg | ORAL_CAPSULE | Freq: Once | ORAL | Status: AC
  Administered 2016-05-03: 100 mg via ORAL
  Filled 2016-05-03: qty 1

## 2016-05-03 MED ORDER — ONDANSETRON HCL 4 MG/2ML IJ SOLN
4.0000 mg | Freq: Once | INTRAMUSCULAR | Status: AC
  Administered 2016-05-03: 4 mg via INTRAVENOUS
  Filled 2016-05-03: qty 2

## 2016-05-03 MED ORDER — IPRATROPIUM-ALBUTEROL 0.5-2.5 (3) MG/3ML IN SOLN
3.0000 mL | Freq: Once | RESPIRATORY_TRACT | Status: AC
  Administered 2016-05-03: 3 mL via RESPIRATORY_TRACT
  Filled 2016-05-03: qty 3

## 2016-05-03 MED ORDER — ALBUTEROL SULFATE HFA 108 (90 BASE) MCG/ACT IN AERS: 1.0000 | INHALATION_SPRAY | Freq: Four times a day (QID) | RESPIRATORY_TRACT | 0 refills | Status: DC | PRN

## 2016-05-03 MED ORDER — MORPHINE SULFATE (PF) 4 MG/ML IV SOLN
4.0000 mg | Freq: Once | INTRAVENOUS | Status: AC
  Administered 2016-05-03: 4 mg via INTRAVENOUS
  Filled 2016-05-03: qty 1

## 2016-05-03 MED ORDER — IOPAMIDOL (ISOVUE-300) INJECTION 61%
INTRAVENOUS | Status: AC
  Filled 2016-05-03: qty 100

## 2016-05-03 MED ORDER — BENZONATATE 100 MG PO CAPS: 100.0000 mg | ORAL_CAPSULE | Freq: Three times a day (TID) | ORAL | 0 refills | Status: DC | PRN

## 2016-05-03 MED ORDER — ACETAMINOPHEN 325 MG PO TABS: 650.0000 mg | ORAL_TABLET | Freq: Four times a day (QID) | ORAL | 0 refills | Status: DC | PRN

## 2016-05-03 NOTE — ED Triage Notes (Signed)
Pt states that he has bilateral lower quadrant abdominal pain. She states that he has a hx of colon polyps. Pt c/o gas and constipation. Pt also states that he has had a productive cough for the last week and a half. He also endorses chills, night sweats and nasal congestion. Denies N/V/D. A&Ox4. Ambulatory.

## 2016-05-03 NOTE — Discharge Instructions (Signed)
All of your labs and imaging as been normal. Please take motrin and tylenol for pain. Take the tessalon for cough and use the albuterol inhaler as needed. Follow up with your primary care doctor. Return to the ED if you symptoms worsen.

## 2016-05-03 NOTE — ED Notes (Signed)
Received bedside report from [facility]

## 2016-05-04 NOTE — ED Provider Notes (Signed)
[facility] DEPT Provider Note   CSN: [id number] Arrival date & time: 05/03/16  1549     History   Chief Complaint Chief Complaint  Patient presents with  . Abdominal Pain  . Cough    HPI Paul Brady is a 61 y.o. male.  61 year old African-American male with a past medical history significant for diverticulitis presents to the ED today with complaints of left lower quadrant abdominal pain that started approximately 2 days ago. This describes the pain as sharp and intermittent in nature. Endorses mild nausea but denies any emesis. Denies any melena or hematochezia. Denies any urinary symptoms. States he does have a history of diverticulitis and colon polyps. States his last bowel movement was approximately 3 days ago. Denies any diarrhea. Pain is not associated with food. Denies any fevers. Patient also reports a productive cough for the past week and a half. States he is coughing up green to yellow sputum.He also endorses chills, rhinorrhea, nasal congestion, body aches. Denies any fevers. Patient has tried Mucinex for the past 2 days with significant relief in his symptoms. States that his nasal congestion and rhinorrhea has become clear. Denies any sick contacts. Denies history of the flu vaccination. States he does have a history of gas and constipation.Denies any chest pain, shortness of breath, urinary symptoms.      Past Medical History:  Diagnosis Date  . Chest pain   . Depression   . Hyperlipidemia   . Hypertension     Patient Active Problem List   Diagnosis Date Noted  . Perforation of sigmoid colon due to diverticulitis   . Benign prostatic hyperplasia with incomplete bladder emptying   . Diverticulitis of colon with perforation 02/07/2016  . Depression 02/07/2016  . Elevated lipase 02/07/2016  . Diverticulitis large intestine 02/07/2016  . Bilateral inguinal hernia (BIH) s/p lap repair 10/14/2014 10/14/2014  . Leg weakness, bilateral 10/14/2014  . Lytic bone  lesions on xray 10/04/2014  . Occipital neuralgia of right side 06/28/2014  . Suicidal ideations 04/12/2014  . Homicidal ideations 04/12/2014  . Major depressive disorder, recurrent severe without psychotic features ([place]) 04/11/2014  . Chest pain at rest 08/26/2013  . Protein-calorie malnutrition, severe ([place]) 08/26/2013  . Cervical radicular pain 03/25/2013  . Neck pain 03/24/2013  . Chest pain 08/04/2012  . HTN (hypertension) 08/04/2012  . Hyperlipidemia 08/04/2012  . Tobacco abuse 08/04/2012    Past Surgical History:  Procedure Laterality Date  . ANTERIOR CERVICAL DECOMP/DISCECTOMY FUSION N/A 03/24/2013   Procedure: ACDF C5 - C7 2 LEVELS;  Surgeon: Melina Schools, MD;  Location: [facility];  Service: Orthopedics;  Laterality: N/A;  . CARPAL TUNNEL RELEASE     right   . HERNIA REPAIR  1980's  . INGUINAL HERNIA REPAIR Bilateral 10/14/2014   Procedure: LAPAROSCOPIC BILATERAL INGUINAL HERNIA REPAIRS WITH MESH;  Surgeon: Michael Boston, MD;  Location: [facility];  Service: General;  Laterality: Bilateral;  . WRIST FUSION Right 04/2012       Home Medications    Prior to Admission medications   Medication Sig Start Date End Date Taking? Authorizing Provider  amLODipine (NORVASC) 10 MG tablet Take 1 tablet (10 mg total) by mouth daily. 02/13/16  Yes Barton Dubois, MD  famotidine (PEPCID) 20 MG tablet Take 1 tablet (20 mg total) by mouth daily. Patient taking differently: Take 20 mg by mouth daily as needed for heartburn.  02/13/16  Yes Barton Dubois, MD  nitroGLYCERIN (NITROSTAT) 0.4 MG SL tablet Place 0.4 mg under the tongue every  5 (five) minutes as needed for chest pain (chest pain).    Yes Historical Provider, MD  polyethylene glycol (MIRALAX) packet Take 17 g by mouth daily as needed for mild constipation. 02/12/16  Yes Barton Dubois, MD  QUEtiapine (SEROQUEL) 25 MG tablet Take 25 mg by mouth 2 (two) times daily. 06/03/14  Yes Historical Provider, MD  QUEtiapine (SEROQUEL) 25 MG tablet Take 75 mg  by mouth every evening.   Yes Historical Provider, MD  QUEtiapine (SEROQUEL) 400 MG tablet Take 400 mg by mouth at bedtime.    Yes Historical Provider, MD  tamsulosin (FLOMAX) 0.4 MG CAPS capsule Take 1 capsule (0.4 mg total) by mouth daily after supper. 02/12/16  Yes Barton Dubois, MD  albuterol (PROVENTIL HFA;VENTOLIN HFA) 108 (90 Base) MCG/ACT inhaler Inhale 1-2 puffs into the lungs every 6 (six) hours as needed for wheezing or shortness of breath. 05/03/16   Doristine Devoid, PA-C  amoxicillin-clavulanate (AUGMENTIN) 875-125 MG tablet Take 1 tablet by mouth 2 (two) times daily. Patient not taking: Reported on 05/03/2016 02/12/16   Barton Dubois, MD  benzonatate (TESSALON PERLES) 100 MG capsule Take 1 capsule (100 mg total) by mouth 3 (three) times daily as needed for cough. 05/03/16   Doristine Devoid, PA-C  nicotine (NICODERM CQ - DOSED IN MG/24 HOURS) 14 mg/24hr patch Place 1 patch (14 mg total) onto the skin daily. Patient not taking: Reported on 05/03/2016 02/13/16   Barton Dubois, MD  oxyCODONE-acetaminophen (PERCOCET/ROXICET) 5-325 MG tablet Take 1 tablet by mouth every 8 (eight) hours as needed for severe pain. Patient not taking: Reported on 05/03/2016 02/12/16   Barton Dubois, MD    Family History Family History  Problem Relation Age of Onset  . Hypertension Mother     Living 35  . Other Father     Work-related injury  . Mental illness Sister   . Mental illness Sister     Social History Social History  Substance Use Topics  . Smoking status: Current Every Day Smoker    Packs/day: 0.25    Years: 40.00    Types: Cigarettes  . Smokeless tobacco: Never Used  . Alcohol use No     Comment: Occasionally     Allergies   Mold extract [trichophyton] and Trazodone and nefazodone   Review of Systems Review of Systems  Constitutional: Negative for chills and fever.  HENT: Positive for congestion, rhinorrhea and sneezing. Negative for sore throat.   Eyes: Negative for visual  disturbance.  Respiratory: Positive for cough. Negative for shortness of breath and wheezing.   Cardiovascular: Negative for chest pain and palpitations.  Gastrointestinal: Positive for abdominal pain (llq), constipation and nausea. Negative for blood in stool, diarrhea and vomiting.  Genitourinary: Negative for dysuria, flank pain, hematuria, scrotal swelling, testicular pain and urgency.  Musculoskeletal: Negative for back pain.  Neurological: Negative for dizziness, syncope, weakness, light-headedness and headaches.  All other systems reviewed and are negative.    Physical Exam Updated Vital Signs BP 149/86   Pulse 60   Temp 97.6 F (36.4 C) (Oral)   Resp 16   Ht 6' 2.5" (1.892 m)   Wt 93.4 kg   SpO2 98%   BMI 26.10 kg/m   Physical Exam  Constitutional: He is oriented to person, place, and time. He appears well-developed and well-nourished. No distress.  Patient is nontoxic appearing. He is in no acute distress sitting on the bed.  HENT:  Head: Normocephalic and atraumatic.  Mouth/Throat: Oropharynx is clear and  moist.  Eyes: Conjunctivae are normal. Right eye exhibits no discharge. Left eye exhibits no discharge. No scleral icterus.  Neck: Normal range of motion. Neck supple. No thyromegaly present.  Cardiovascular: Normal rate, regular rhythm, normal heart sounds and intact distal pulses.  Exam reveals no gallop and no friction rub.   No murmur heard. Pulmonary/Chest: Effort normal and breath sounds normal. No respiratory distress. He has no wheezes. He exhibits no tenderness.  CTAB  Abdominal: Soft. Bowel sounds are normal. He exhibits no distension. There is tenderness in the left lower quadrant. There is no rigidity, no rebound, no guarding, no CVA tenderness, no tenderness at McBurney's point and negative Murphy's sign.  Musculoskeletal: Normal range of motion.  Lymphadenopathy:    He has no cervical adenopathy.  Neurological: He is alert and oriented to person,  place, and time.  Skin: Skin is warm and dry. Capillary refill takes less than 2 seconds.  Nursing note and vitals reviewed.    ED Treatments / Results  Labs (all labs ordered are listed, but only abnormal results are displayed) Labs Reviewed  LIPASE, BLOOD - Abnormal; Notable for the following:       Result Value   Lipase 83 (*)    All other components within normal limits  COMPREHENSIVE METABOLIC PANEL - Abnormal; Notable for the following:    ALT 15 (*)    All other components within normal limits  CBC  URINALYSIS, ROUTINE W REFLEX MICROSCOPIC    EKG  EKG Interpretation None       Radiology Dg Chest 2 View  Result Date: 05/03/2016 CLINICAL DATA:  Acute onset of bilateral lower quadrant abdominal pain and productive cough. Chills and diaphoresis. Nasal congestion. Initial encounter. EXAM: CHEST  2 VIEW COMPARISON:  Chest radiograph from 04/27/2015 FINDINGS: The lungs are well-aerated and clear. There is no evidence of focal opacification, pleural effusion or pneumothorax. The heart is normal in size; the mediastinal contour is within normal limits. No acute osseous abnormalities are seen. Cervical spinal fusion hardware is noted. IMPRESSION: No acute cardiopulmonary process seen. Electronically Signed   By: Garald Balding M.D.   On: 05/03/2016 18:02   Ct Abdomen Pelvis W Contrast  Result Date: 05/03/2016 CLINICAL DATA:  Bilateral lower quadrant abdominal pain with gas and constipation. Productive cough. Chills, night sweats and nasal congestion. EXAM: CT ABDOMEN AND PELVIS WITH CONTRAST TECHNIQUE: Multidetector CT imaging of the abdomen and pelvis was performed using the standard protocol following bolus administration of intravenous contrast. CONTRAST:  154mL ISOVUE-300 IOPAMIDOL (ISOVUE-300) INJECTION 61% COMPARISON:  02/07/2016 FINDINGS: Lower chest: Within normal. Hepatobiliary: Liver, gallbladder and biliary tree are within normal. Pancreas: Within normal. Spleen: Within  normal. Adrenals/Urinary Tract: Adrenal glands are normal. Kidneys normal size without hydronephrosis or nephrolithiasis. Ureters and bladder are normal. Stomach/Bowel: The stomach and small bowel are within normal. Appendix is normal. Mild diverticulosis of the colon with interval resolution of the previously seen sigmoid diverticulitis. Vascular/Lymphatic: Minimal calcified plaque over the distal abdominal aorta and iliac arteries. No significant adenopathy. Reproductive: Within normal. Other: No free fluid or focal inflammatory change. Musculoskeletal: Mild degenerate change of the spine. IMPRESSION: No acute findings in the abdomen/pelvis. Mild colonic diverticulosis without active inflammation. Minimal aortic atherosclerosis. Electronically Signed   By: Marin Olp M.D.   On: 05/03/2016 20:04    Procedures Procedures (including critical care time)  Medications Ordered in ED Medications  ondansetron (ZOFRAN) injection 4 mg (4 mg Intravenous Given 05/03/16 1853)  morphine 4 MG/ML injection 4  mg (4 mg Intravenous Given 05/03/16 1853)  iopamidol (ISOVUE-300) 61 % injection 100 mL (100 mLs Intravenous Contrast Given 05/03/16 1913)  ipratropium-albuterol (DUONEB) 0.5-2.5 (3) MG/3ML nebulizer solution 3 mL (3 mLs Nebulization Given 05/03/16 1948)  benzonatate (TESSALON) capsule 100 mg (100 mg Oral Given 05/03/16 1931)     Initial Impression / Assessment and Plan / ED Course  I have reviewed the triage vital signs and the nursing notes.  Pertinent labs & imaging results that were available during my care of the patient were reviewed by me and considered in my medical decision making (see chart for details).     Patient presents with left lower quadrant abdominal pain and productive cough. No leukocytosis. Mild elevation in lipase. Urine without signs of infection. Patient is afebrile and not tachycardic.Patient is nontoxic, nonseptic appearing, in no apparent distress.  Patient's pain and other  symptoms adequately managed in emergency department.  Fluid bolus given.  Labs, imaging and vitals reviewed.  CT without any acute findings. No signs of diverticulitis. Patient does not meet the SIRS or Sepsis criteria.  On repeat exam patient does not have a surgical abdomin and there are no peritoneal signs.  No indication of appendicitis, bowel obstruction, bowel perforation, cholecystitis, diverticulitis.  Abdominal pain likely due to musculoskeletal pain from coughing.Chest x-ray shows no focal infiltrate. Lungs clear to auscultation bilaterally.Patient given DuoNeb with significant relief in his symptoms. Will be discharged with albuterol inhaler and Tessalon.Pain management in the ED. Patient discharged home with symptomatic treatment and given strict instructions for follow-up with their primary care physician.  I have also discussed reasons to return immediately to the ER.  Patient expresses understanding and agrees with plan.     Final Clinical Impressions(s) / ED Diagnoses   Final diagnoses:  Cough  Left lower quadrant pain    New Prescriptions Discharge Medication List as of 05/03/2016  8:26 PM    START taking these medications   Details  albuterol (PROVENTIL HFA;VENTOLIN HFA) 108 (90 Base) MCG/ACT inhaler Inhale 1-2 puffs into the lungs every 6 (six) hours as needed for wheezing or shortness of breath., Starting Fri 05/03/2016, Print    benzonatate (TESSALON PERLES) 100 MG capsule Take 1 capsule (100 mg total) by mouth 3 (three) times daily as needed for cough., Starting Fri 05/03/2016, Print         Doristine Devoid, PA-C 05/04/16 [place], DO 05/04/16 [id number]

## 2016-07-23 ENCOUNTER — Emergency Department (HOSPITAL_COMMUNITY): Payer: Medicare HMO

## 2016-07-23 ENCOUNTER — Other Ambulatory Visit: Payer: Self-pay

## 2016-07-23 ENCOUNTER — Encounter (HOSPITAL_COMMUNITY): Payer: Self-pay

## 2016-07-23 ENCOUNTER — Emergency Department (HOSPITAL_COMMUNITY)
Admission: EM | Admit: 2016-07-23 | Discharge: 2016-07-23 | Disposition: A | Discharge status: Home / Self Care | Payer: Medicare HMO | Attending: Emergency Medicine | Admitting: Emergency Medicine

## 2016-07-23 DIAGNOSIS — N410 Acute prostatitis: Secondary | ICD-10-CM | POA: Diagnosis not present

## 2016-07-23 DIAGNOSIS — R1032 Left lower quadrant pain: Secondary | ICD-10-CM | POA: Diagnosis present

## 2016-07-23 DIAGNOSIS — F17210 Nicotine dependence, cigarettes, uncomplicated: Secondary | ICD-10-CM | POA: Diagnosis not present

## 2016-07-23 DIAGNOSIS — I10 Essential (primary) hypertension: Secondary | ICD-10-CM | POA: Insufficient documentation

## 2016-07-23 DIAGNOSIS — K5730 Diverticulosis of large intestine without perforation or abscess without bleeding: Secondary | ICD-10-CM | POA: Insufficient documentation

## 2016-07-23 LAB — CBC
HCT: 43.9 % (ref 39.0–52.0)
HEMOGLOBIN: 14.7 g/dL (ref 13.0–17.0)
MCH: 28.6 pg (ref 26.0–34.0)
MCHC: 33.5 g/dL (ref 30.0–36.0)
MCV: 85.4 fL (ref 78.0–100.0)
PLATELETS: 223 10*3/uL (ref 150–400)
RBC: 5.14 MIL/uL (ref 4.22–5.81)
RDW: 15 % (ref 11.5–15.5)
WBC: 7.5 10*3/uL (ref 4.0–10.5)

## 2016-07-23 LAB — URINALYSIS, ROUTINE W REFLEX MICROSCOPIC
Bilirubin Urine: NEGATIVE
Glucose, UA: NEGATIVE mg/dL
HGB URINE DIPSTICK: NEGATIVE
Ketones, ur: NEGATIVE mg/dL
Leukocytes, UA: NEGATIVE
Nitrite: NEGATIVE
PH: 5 (ref 5.0–8.0)
Protein, ur: NEGATIVE mg/dL
SPECIFIC GRAVITY, URINE: 1.013 (ref 1.005–1.030)

## 2016-07-23 LAB — COMPREHENSIVE METABOLIC PANEL
ALK PHOS: 59 U/L (ref 38–126)
ALT: 12 U/L — AB (ref 17–63)
ANION GAP: 5 (ref 5–15)
AST: 15 U/L (ref 15–41)
Albumin: 4.6 g/dL (ref 3.5–5.0)
BILIRUBIN TOTAL: 0.9 mg/dL (ref 0.3–1.2)
BUN: 7 mg/dL (ref 6–20)
CALCIUM: 9.2 mg/dL (ref 8.9–10.3)
CO2: 27 mmol/L (ref 22–32)
CREATININE: 0.87 mg/dL (ref 0.61–1.24)
Chloride: 108 mmol/L (ref 101–111)
GFR calc non Af Amer: >60 mL/min (ref >60)
GLUCOSE: 86 mg/dL (ref 65–99)
Potassium: 3.9 mmol/L (ref 3.5–5.1)
Sodium: 140 mmol/L (ref 135–145)
TOTAL PROTEIN: 7.5 g/dL (ref 6.5–8.1)

## 2016-07-23 LAB — I-STAT TROPONIN, ED: TROPONIN I, POC: 0 ng/mL (ref 0.00–0.08)

## 2016-07-23 LAB — LIPASE, BLOOD: LIPASE: 19 U/L (ref 11–51)

## 2016-07-23 MED ORDER — IOPAMIDOL (ISOVUE-300) INJECTION 61%
100.0000 mL | Freq: Once | INTRAVENOUS | Status: AC | PRN
  Administered 2016-07-23: 100 mL via INTRAVENOUS

## 2016-07-23 MED ORDER — MORPHINE SULFATE (PF) 4 MG/ML IV SOLN
4.0000 mg | Freq: Once | INTRAVENOUS | Status: AC
  Administered 2016-07-23: 4 mg via INTRAVENOUS
  Filled 2016-07-23: qty 1

## 2016-07-23 MED ORDER — STERILE WATER FOR INJECTION IJ SOLN
INTRAMUSCULAR | Status: AC
  Administered 2016-07-23: 10 mL
  Filled 2016-07-23: qty 10

## 2016-07-23 MED ORDER — IOPAMIDOL (ISOVUE-300) INJECTION 61%
INTRAVENOUS | Status: AC
  Filled 2016-07-23: qty 100

## 2016-07-23 MED ORDER — DOXYCYCLINE HYCLATE 100 MG PO CAPS: 100.0000 mg | ORAL_CAPSULE | Freq: Two times a day (BID) | ORAL | 0 refills | Status: AC

## 2016-07-23 MED ORDER — CEFTRIAXONE SODIUM 250 MG IJ SOLR
250.0000 mg | Freq: Once | INTRAMUSCULAR | Status: AC
  Administered 2016-07-23: 250 mg via INTRAMUSCULAR
  Filled 2016-07-23: qty 250

## 2016-07-23 NOTE — ED Notes (Signed)
Patient transported to CT 

## 2016-07-23 NOTE — Discharge Instructions (Signed)
Please take doxycycline twice a day for 3 weeks. Please make sure to take complete regimen as prescribed. Please follow up with urology within 1 week regarding today's visit. Please follow-up with your primary care provider in 1-2 weeks today's visit. Please take Tylenol as needed for your pain.  Get help right away if: You have chills. You feel nauseous. You vomit. You feel light-headed or feel like you are going to faint. You are unable to urinate. You have blood or blood clots in your urine.

## 2016-07-23 NOTE — ED Triage Notes (Signed)
Per EMS- Patient c/o left rib cag pain and left flank pain x 2-3 days. Patient rates pain 10/10.

## 2016-07-23 NOTE — ED Provider Notes (Signed)
  Face-to-face evaluation   History: Onset of abdominal pain with nausea and diarrhea, several days ago, and worsening.  Pain feels similar to recent episode of diverticulitis.  He did not follow-up with a physician after that visit.  Physical exam: Alert male who appears uncomfortable.  Heart regular rate and rhythm without murmur lungs clear.  Abdomen soft.  Mild left upper and lower quadrant tenderness.  No mass or rebound.    Date: 07/23/16  Rate: 99  Rhythm: normal sinus rhythm  QRS Axis: normal  PR and QT Intervals: normal  ST/T Wave abnormalities: normal  PR and QRS Conduction Disutrbances:none  Narrative Interpretation:   Old EKG Reviewed: unchanged     Medical screening examination/treatment/procedure(s) were conducted as a shared visit with non-physician practitioner(s) and myself.  I personally evaluated the patient during the encounter   Daleen Bo, MD 07/23/16 2002

## 2016-07-23 NOTE — ED Provider Notes (Signed)
[facility] DEPT Provider Note   CSN: [id number] Arrival date & time: 07/23/16  1122     History   Chief Complaint Chief Complaint  Patient presents with  . rib cage pain  . Flank Pain    HPI Paul Brady is a 61 y.o. male with PMHx diverticulitis brought in by EMS presents today with complaints of LLQ pain x 3-4 worsened today. He reports the pain woke him up this morning at 2am and feels like the pain is "pushing up and hitting my rib cage" and also radiating to his back. He reports his pain worsening, constant, 7/10 lying down and with movement it is 9 10/10. Movement, turning makes symptoms worse. He denies alleviating symptoms. He has tried aspirin and excedrin with no relief.  He reports this feels like "a lot of swelling" to his left side. He reports it is similar to previous diverticulitis pain but worse. He reports nausea. He denies emesis. He denies urinary symptoms. Hematuria. He reports his bowel movement is "water and lose". He does reports a history of kidney stone "one time in the 80's".  He reports chest pain "from all the pushing coming from my left side of belly". He denies fevers, chills, shortness of breath. He reports hernia surgery as only abdominal surgery.   The history is provided by the patient. No language interpreter was used.    Past Medical History:  Diagnosis Date  . Chest pain   . Depression   . Hyperlipidemia   . Hypertension     Patient Active Problem List   Diagnosis Date Noted  . Perforation of sigmoid colon due to diverticulitis   . Benign prostatic hyperplasia with incomplete bladder emptying   . Diverticulitis of colon with perforation 02/07/2016  . Depression 02/07/2016  . Elevated lipase 02/07/2016  . Diverticulitis large intestine 02/07/2016  . Bilateral inguinal hernia (BIH) s/p lap repair 10/14/2014 10/14/2014  . Leg weakness, bilateral 10/14/2014  . Lytic bone lesions on xray 10/04/2014  . Occipital neuralgia of right side  06/28/2014  . Suicidal ideations 04/12/2014  . Homicidal ideations 04/12/2014  . Major depressive disorder, recurrent severe without psychotic features ([place]) 04/11/2014  . Chest pain at rest 08/26/2013  . Protein-calorie malnutrition, severe ([place]) 08/26/2013  . Cervical radicular pain 03/25/2013  . Neck pain 03/24/2013  . Chest pain 08/04/2012  . HTN (hypertension) 08/04/2012  . Hyperlipidemia 08/04/2012  . Tobacco abuse 08/04/2012    Past Surgical History:  Procedure Laterality Date  . ANTERIOR CERVICAL DECOMP/DISCECTOMY FUSION N/A 03/24/2013   Procedure: ACDF C5 - C7 2 LEVELS;  Surgeon: Melina Schools, MD;  Location: [facility];  Service: Orthopedics;  Laterality: N/A;  . CARPAL TUNNEL RELEASE     right   . HERNIA REPAIR  1980's  . INGUINAL HERNIA REPAIR Bilateral 10/14/2014   Procedure: LAPAROSCOPIC BILATERAL INGUINAL HERNIA REPAIRS WITH MESH;  Surgeon: Michael Boston, MD;  Location: [facility];  Service: General;  Laterality: Bilateral;  . WRIST FUSION Right 04/2012       Home Medications    Prior to Admission medications   Medication Sig Start Date End Date Taking? Authorizing Provider  doxycycline (VIBRAMYCIN) 100 MG capsule Take 1 capsule (100 mg total) by mouth 2 (two) times daily. 07/23/16 08/13/16  Bettey Costa, PA    Family History Family History  Problem Relation Age of Onset  . Hypertension Mother        Living 33  . Other Father  Work-related injury  . Mental illness Sister   . Mental illness Sister     Social History Social History  Substance Use Topics  . Smoking status: Current Every Day Smoker    Packs/day: 0.25    Years: 40.00    Types: Cigarettes  . Smokeless tobacco: Never Used  . Alcohol use 0.0 oz/week     Comment: Occasionally     Allergies   Mold extract [trichophyton] and Trazodone and nefazodone   Review of Systems Review of Systems  Constitutional: Negative for chills and fever.  Eyes: Negative for photophobia and visual  disturbance.  Respiratory: Negative for shortness of breath.   Cardiovascular: Positive for chest pain (secondary to abdominal pain and swelling pushing up to chest).  Gastrointestinal: Positive for abdominal pain (left sided) and nausea. Negative for diarrhea and vomiting.  Genitourinary: Negative for difficulty urinating and dysuria.  Musculoskeletal: Positive for back pain.  Skin: Negative for rash and wound.  All other systems reviewed and are negative.    Physical Exam Updated Vital Signs BP (!) 158/87   Pulse 61   Temp 98 F (36.7 C) (Oral)   Resp 14   Ht 6\' 2"  (1.88 m)   Wt 90.7 kg   SpO2 100%   BMI 25.68 kg/m   Physical Exam  Constitutional: He appears well-developed and well-nourished.  Uncomfortable  HENT:  Head: Normocephalic and atraumatic.  Nose: Nose normal.  Mouth/Throat: Oropharynx is clear and moist.  Eyes: Conjunctivae and EOM are normal.  Neck: Normal range of motion.  Cardiovascular: Normal rate, normal heart sounds and intact distal pulses.   No murmur heard. Pulmonary/Chest: Effort normal and breath sounds normal. No respiratory distress. He has no wheezes. He has no rales.  Normal work of breathing. No respiratory distress noted.   Abdominal: Soft. Bowel sounds are normal. There is tenderness. There is guarding. There is no rebound.  Abdomen is soft. Patient is actually tender to left lower quadrant but also tender to left upper quadrant. Patient does exhibit guarding to this area. No rebound tenderness. CVA tenderness noted to left side. No CVA tenderness to right.   Genitourinary:  Genitourinary Comments: Chaperone was present. Patient with no pain around the rectal area. There are no external fissures noted. No induration of the skin or swelling. No external hemorrhoids seen. Patient able to tolerate examination. I was able to feel the first 2-3cm of the rectum digitally without gross abnormality. There is no gross blood.  NO signs of perirectal  abscess. Prostate noted to be boggy with moderate TTP.   Musculoskeletal: Normal range of motion.  Neurological: He is alert.  Skin: Skin is warm. No rash noted.  Psychiatric: He has a normal mood and affect. His behavior is normal.  Nursing note and vitals reviewed.    ED Treatments / Results  Labs (all labs ordered are listed, but only abnormal results are displayed) Labs Reviewed  COMPREHENSIVE METABOLIC PANEL - Abnormal; Notable for the following:       Result Value   ALT 12 (*)    All other components within normal limits  URINALYSIS, ROUTINE W REFLEX MICROSCOPIC  CBC  LIPASE, BLOOD  I-STAT TROPOININ, ED    EKG  EKG Interpretation None       Radiology Dg Chest 2 View  Result Date: 07/23/2016 CLINICAL DATA:  Left-sided thorax and abdominal pain for 3 days EXAM: CHEST  2 VIEW COMPARISON:  05/03/2016 FINDINGS: Hardware in the cervical spine. No acute infiltrate or effusion.  Normal cardiomediastinal silhouette. No pneumothorax. IMPRESSION: No active cardiopulmonary disease. Electronically Signed   By: Donavan Foil M.D.   On: 07/23/2016 14:21   Ct Abdomen Pelvis W Contrast  Result Date: 07/23/2016 CLINICAL DATA:  LUQ/LLQ pain with abd distention x 3 days; worse since 3am; nausea; loose stools; wt loss; night sweats; no recent surg^149mL ISOVUE-300 IOPAMIDOL (ISOVUE-300) INJECTION 61% EXAM: CT ABDOMEN AND PELVIS WITH CONTRAST TECHNIQUE: Multidetector CT imaging of the abdomen and pelvis was performed using the standard protocol following bolus administration of intravenous contrast. CONTRAST:  160mL ISOVUE-300 IOPAMIDOL (ISOVUE-300) INJECTION 61% COMPARISON:  CT abdomen dated 05/03/2016. FINDINGS: Lower chest: No acute abnormality. Hepatobiliary: No focal liver abnormality is seen. No gallstones, gallbladder wall thickening, or biliary dilatation. Pancreas: Unremarkable. No pancreatic ductal dilatation or surrounding inflammatory changes. Spleen: Normal in size without focal  abnormality. Adrenals/Urinary Tract: Adrenal glands appear normal. Kidneys appear normal without mass, stone or hydronephrosis. No ureteral or bladder calculi identified. Bladder is unremarkable, partially decompressed. Stomach/Bowel: Bowel is normal in caliber. Scattered diverticulosis within the descending and sigmoid colon. Perhaps mild thickening of the walls of the descending colon but not convincing. No pericolonic inflammation to suggest acute diverticulitis at this time. No small bowel wall inflammation seen. Stomach appears normal. Appendix is normal. Vascular/Lymphatic: Aortic atherosclerosis. No enlarged abdominal or pelvic lymph nodes. Reproductive: Prostate gland is mildly prominent in size, with a central nodular component causing slight mass effect on the bladder base. Other: No free fluid or abscess collection. No free intraperitoneal air. Musculoskeletal: Mild degenerative spurring within the slightly scoliotic thoracolumbar spine. No acute or suspicious osseous finding. Superficial soft tissues are unremarkable. IMPRESSION: 1. Colonic diverticulosis without pericolonic inflammation or other convincing evidence of acute diverticulitis. Perhaps mild thickening of the walls of the descending colon which is suspected CHRONIC muscularis hypertrophy related to the underlying diverticulosis, less likely an acute mild diverticulitis. 2. Remainder of the abdomen and pelvis CT is unremarkable. No bowel obstruction. No free fluid or abscess collection. No free intraperitoneal air. No evidence of acute solid organ abnormality. No renal or ureteral calculi. 3. **An incidental finding of potential clinical significance has been found. Prostate gland mildly prominent in size, with central nodular component causing slight mass effect on the bladder base. Recommend correlation with physical exam findings and/or PSA lab values. ** 4. Aortic atherosclerosis. Electronically Signed   By: Franki Cabot M.D.   On:  07/23/2016 16:02    Procedures Procedures (including critical care time)  Medications Ordered in ED Medications  iopamidol (ISOVUE-300) 61 % injection (not administered)  morphine 4 MG/ML injection 4 mg (4 mg Intravenous Given 07/23/16 1358)  iopamidol (ISOVUE-300) 61 % injection 100 mL (100 mLs Intravenous Contrast Given 07/23/16 1541)  cefTRIAXone (ROCEPHIN) injection 250 mg (250 mg Intramuscular Given 07/23/16 1716)  sterile water (preservative free) injection (10 mLs  Given 07/23/16 1717)     Initial Impression / Assessment and Plan / ED Course  I have reviewed the triage vital signs and the nursing notes.  Pertinent labs & imaging results that were available during my care of the patient were reviewed by me and considered in my medical decision making (see chart for details).     Patient here with colonic diverticulosis without pericolonic inflammation or other convincing evidence of acute diverticulitis on CT. There was an incidental finding on CT that showed prostate gland mildly prominent in size with central nodular component causing slight mass effect on bladder base. Patient's prostate was boggy and tender to  palpation on exam.  Low suspicion for diverticulitis, kidney stone, pancreatitis, SBO. Patient here afebrile, hemodynamically stable, uncomfortable. Heart and lung sounds are clear. Patient's abdomen was soft and nontender to left lower quadrant and upper left quadrant. Patient also had a CVA tenderness to left side. Lab work is reassuring. Chest x-ray is negative for any acute findings. Patient given morphine here with relief. Patient also given Rocephin here and will be discharged with doxycycline x 3 weeks. Patient given instruction to follow-up with urologist regarding today's visit. Patient also given instruction to follow up with his primary care provider. Patient is agreeable with discharge and verbally understands. He is in agreement with assessment and plan. Reasons to  immediately return to ED discussed. Pt also seen and evaluated by Dr. Eulis Foster who agreed with assessment and plan.    Final Clinical Impressions(s) / ED Diagnoses   Final diagnoses:  Acute prostatitis  Diverticulosis of large intestine without hemorrhage    New Prescriptions New Prescriptions   DOXYCYCLINE (VIBRAMYCIN) 100 MG CAPSULE    Take 1 capsule (100 mg total) by mouth 2 (two) times daily.     Flonnie Overman [place], [place] 07/23/16 1719    Daleen Bo, MD 07/23/16 2003

## 2016-07-23 NOTE — ED Notes (Signed)
Bed: WA02 Expected date:  Expected time:  Means of arrival:  Comments: 

## 2016-07-23 NOTE — ED Notes (Signed)
Bed: WA01 Expected date:  Expected time:  Means of arrival:  Comments: 

## 2016-08-06 ENCOUNTER — Ambulatory Visit (HOSPITAL_COMMUNITY)
Admission: EM | Admit: 2016-08-06 | Discharge: 2016-08-06 | Disposition: A | Discharge status: Home / Self Care | Payer: Medicare HMO | Attending: Family Medicine | Admitting: Family Medicine

## 2016-08-06 ENCOUNTER — Encounter (HOSPITAL_COMMUNITY): Payer: Self-pay | Admitting: *Deleted

## 2016-08-06 DIAGNOSIS — J301 Allergic rhinitis due to pollen: Secondary | ICD-10-CM | POA: Diagnosis not present

## 2016-08-06 DIAGNOSIS — R05 Cough: Secondary | ICD-10-CM

## 2016-08-06 DIAGNOSIS — J9801 Acute bronchospasm: Secondary | ICD-10-CM | POA: Diagnosis not present

## 2016-08-06 DIAGNOSIS — J069 Acute upper respiratory infection, unspecified: Secondary | ICD-10-CM

## 2016-08-06 HISTORY — DX: Diverticulitis of intestine, part unspecified, without perforation or abscess without bleeding: K57.92

## 2016-08-06 MED ORDER — ALBUTEROL SULFATE HFA 108 (90 BASE) MCG/ACT IN AERS: 2.0000 | INHALATION_SPRAY | RESPIRATORY_TRACT | 0 refills | Status: DC | PRN

## 2016-08-06 MED ORDER — IPRATROPIUM-ALBUTEROL 0.5-2.5 (3) MG/3ML IN SOLN
3.0000 mL | Freq: Once | RESPIRATORY_TRACT | Status: AC
  Administered 2016-08-06: 3 mL via RESPIRATORY_TRACT

## 2016-08-06 MED ORDER — DEXAMETHASONE SODIUM PHOSPHATE 10 MG/ML IJ SOLN
10.0000 mg | Freq: Once | INTRAMUSCULAR | Status: AC
  Administered 2016-08-06: 10 mg via INTRAMUSCULAR

## 2016-08-06 MED ORDER — PREDNISONE 50 MG PO TABS: ORAL_TABLET | ORAL | 0 refills | Status: DC

## 2016-08-06 MED ORDER — IPRATROPIUM-ALBUTEROL 0.5-2.5 (3) MG/3ML IN SOLN
RESPIRATORY_TRACT | Status: AC
  Filled 2016-08-06: qty 3

## 2016-08-06 MED ORDER — DEXAMETHASONE SODIUM PHOSPHATE 10 MG/ML IJ SOLN
INTRAMUSCULAR | Status: AC
  Filled 2016-08-06: qty 1

## 2016-08-06 NOTE — Discharge Instructions (Signed)
Your symptoms are suggestive of a combination of allergies and possibly a summer cold like illness. The treatment for your symptoms are the same. In addition you may need to rest a little bit longer. Use the albuterol inhaler 2 puffs every 4 hours as needed for cough or shortness of breath. Take the prednisone as directed starting tomorrow. Take with food. The following medications, also help with congestion and other allergy type symptoms. Sudafed PE 10 mg every 4 to 6 hours as needed for congestion Allegra or Zyrtec daily as needed for drainage and runny nose. For stronger antihistamine may take Chlor-Trimeton 2 to 4 mg every 4 to 6 hours, may cause drowsiness. Saline nasal spray used frequently. Ibuprofen 600 mg every 6 hours as needed for pain, discomfort or fever. Drink plenty of fluids and stay well-hydrated. Flonase or Rhinocort nasal spray daily

## 2016-08-06 NOTE — ED Triage Notes (Signed)
Pt  Reports   symprtoms  Of  Cough   /  Congested   X  6  Days   Ago      With  Symptoms  Of  Fever       Pt     Is  Awake  And  Alert       Pt  Reports  Sinus      Congested    Pt  Reports  Symptoms  Not  releived  By     [facility]  meds

## 2016-08-06 NOTE — ED Provider Notes (Signed)
CSN: [id number]     Arrival date & time 08/06/16  1218 History   First MD Initiated Contact with Patient 08/06/16 1424     Chief Complaint  Patient presents with  . URI   (Consider location/radiation/quality/duration/timing/severity/associated sxs/prior Treatment) 61 year old male smoker presents with 5 to six-day history of body aches, head and nasal congestion, mucus in the head and chest and one day a temperature of 100.2. His also complaining of some shortness of breath, hard to get his breath sometimes. He has taken Alka-Seltzer cold plus medication without relief. His temperature currently is 98.6.      Past Medical History:  Diagnosis Date  . Chest pain   . Depression   . Diverticulitis   . Hyperlipidemia   . Hypertension    Past Surgical History:  Procedure Laterality Date  . ANTERIOR CERVICAL DECOMP/DISCECTOMY FUSION N/A 03/24/2013   Procedure: ACDF C5 - C7 2 LEVELS;  Surgeon: Melina Schools, MD;  Location: [facility];  Service: Orthopedics;  Laterality: N/A;  . CARPAL TUNNEL RELEASE     right   . HERNIA REPAIR  1980's  . INGUINAL HERNIA REPAIR Bilateral 10/14/2014   Procedure: LAPAROSCOPIC BILATERAL INGUINAL HERNIA REPAIRS WITH MESH;  Surgeon: Michael Boston, MD;  Location: [facility];  Service: General;  Laterality: Bilateral;  . WRIST FUSION Right 04/2012   Family History  Problem Relation Age of Onset  . Hypertension Mother        Living 80  . Other Father        Work-related injury  . Mental illness Sister   . Mental illness Sister    Social History  Substance Use Topics  . Smoking status: Current Every Day Smoker    Packs/day: 0.25    Years: 40.00    Types: Cigarettes  . Smokeless tobacco: Never Used  . Alcohol use 0.0 oz/week     Comment: Occasionally    Review of Systems  Constitutional: Positive for activity change and fever. Negative for diaphoresis and fatigue.  HENT: Positive for congestion and postnasal drip. Negative for ear pain, facial swelling,  rhinorrhea, sore throat and trouble swallowing.   Eyes: Negative for pain, discharge and redness.  Respiratory: Positive for cough and shortness of breath. Negative for chest tightness.   Cardiovascular: Negative.   Gastrointestinal: Negative.   Musculoskeletal: Negative.  Negative for neck pain and neck stiffness.  Skin: Negative.   Neurological: Negative.   All other systems reviewed and are negative.   Allergies  Mold extract [trichophyton] and Trazodone and nefazodone  Home Medications   Prior to Admission medications   Medication Sig Start Date End Date Taking? Authorizing Provider  albuterol (PROVENTIL HFA;VENTOLIN HFA) 108 (90 Base) MCG/ACT inhaler Inhale 2 puffs into the lungs every 4 (four) hours as needed for wheezing or shortness of breath. 08/06/16   Janne Napoleon, NP  doxycycline (VIBRAMYCIN) 100 MG capsule Take 1 capsule (100 mg total) by mouth 2 (two) times daily. 07/23/16 08/13/16  Bettey Costa, PA  predniSONE (DELTASONE) 50 MG tablet 1 tab po daily for 6 days. Take with food. Start 08/07/16. 08/06/16   Janne Napoleon, NP   Meds Ordered and Administered this Visit   Medications  ipratropium-albuterol (DUONEB) 0.5-2.5 (3) MG/3ML nebulizer solution 3 mL (3 mLs Nebulization Given 08/06/16 1502)  dexamethasone (DECADRON) injection 10 mg (10 mg Intramuscular Given 08/06/16 1459)    BP (!) 175/85 (BP Location: Right Arm)   Pulse 60   Temp 98.6 F (37 C) (Oral)  Resp 16   SpO2 100%  No data found.   Physical Exam  Constitutional: He is oriented to person, place, and time. He appears well-developed and well-nourished. No distress.  HENT:  Head: Normocephalic and atraumatic.  Mouth/Throat: No oropharyngeal exudate.  Bilateral TMs are normal. Oropharynx with minor erythema and small amount of clear PND.  Eyes: EOM are normal.  Neck: Normal range of motion. Neck supple.  Cardiovascular: Normal rate, regular rhythm and normal heart sounds.   Pulmonary/Chest: Effort  normal. No respiratory distress. He has no rales.  Tidal volume is low and no adventitious sounds. When encouraged the patient was able to make a relatively deep inspiration and on expiration faint end-expiratory wheeze.  Musculoskeletal: Normal range of motion. He exhibits no edema.  Lymphadenopathy:    He has no cervical adenopathy.  Neurological: He is alert and oriented to person, place, and time.  Skin: Skin is warm and dry. No rash noted.  Psychiatric: He has a normal mood and affect.  Nursing note and vitals reviewed.   Urgent Care Course     Procedures (including critical care time)  Labs Review Labs Reviewed - No data to display  Imaging Review No results found.   Visual Acuity Review  Right Eye Distance:   Left Eye Distance:   Bilateral Distance:    Right Eye Near:   Left Eye Near:    Bilateral Near:         MDM   1. Acute upper respiratory infection   2. Seasonal allergic rhinitis due to pollen   3. Cough due to bronchospasm   Post DuoNeb patient states he feels like his breathing little better. Auscultation reveals improved air movement although the patient still is either unwilling or unable to take a very deep breath. No wheezing. Some coarseness with coughing is present. Your symptoms are suggestive of a combination of allergies and possibly a summer cold like illness. The treatment for your symptoms are the same. In addition you may need to rest a little bit longer. Use the albuterol inhaler 2 puffs every 4 hours as needed for cough or shortness of breath. Take the prednisone as directed starting tomorrow. Take with food. The following medications, also help with congestion and other allergy type symptoms. Sudafed PE 10 mg every 4 to 6 hours as needed for congestion Allegra or Zyrtec daily as needed for drainage and runny nose. For stronger antihistamine may take Chlor-Trimeton 2 to 4 mg every 4 to 6 hours, may cause drowsiness. Saline nasal spray used  frequently. Ibuprofen 600 mg every 6 hours as needed for pain, discomfort or fever. Drink plenty of fluids and stay well-hydrated. Flonase or Rhinocort nasal spray daily Meds ordered this encounter  Medications  . ipratropium-albuterol (DUONEB) 0.5-2.5 (3) MG/3ML nebulizer solution 3 mL  . dexamethasone (DECADRON) injection 10 mg  . albuterol (PROVENTIL HFA;VENTOLIN HFA) 108 (90 Base) MCG/ACT inhaler    Sig: Inhale 2 puffs into the lungs every 4 (four) hours as needed for wheezing or shortness of breath.    Dispense:  1 Inhaler    Refill:  0    Order Specific Question:   Supervising Provider    Answer:   Robyn Haber [[id number]]  . predniSONE (DELTASONE) 50 MG tablet    Sig: 1 tab po daily for 6 days. Take with food. Start 08/07/16.    Dispense:  6 tablet    Refill:  0    Order Specific Question:   Supervising Provider  Answer:   Robyn Haber [[id number]]       YHCW, CBJSE, NP 08/06/16 1529

## 2016-12-10 DIAGNOSIS — Z720 Tobacco use: Secondary | ICD-10-CM | POA: Diagnosis not present

## 2016-12-10 DIAGNOSIS — H538 Other visual disturbances: Secondary | ICD-10-CM | POA: Diagnosis not present

## 2016-12-10 DIAGNOSIS — Z5181 Encounter for therapeutic drug level monitoring: Secondary | ICD-10-CM | POA: Diagnosis not present

## 2016-12-10 DIAGNOSIS — Z01118 Encounter for examination of ears and hearing with other abnormal findings: Secondary | ICD-10-CM | POA: Diagnosis not present

## 2016-12-10 DIAGNOSIS — Z131 Encounter for screening for diabetes mellitus: Secondary | ICD-10-CM | POA: Diagnosis not present

## 2016-12-10 DIAGNOSIS — I10 Essential (primary) hypertension: Secondary | ICD-10-CM | POA: Diagnosis not present

## 2016-12-10 DIAGNOSIS — N400 Enlarged prostate without lower urinary tract symptoms: Secondary | ICD-10-CM | POA: Diagnosis not present

## 2016-12-10 DIAGNOSIS — Z136 Encounter for screening for cardiovascular disorders: Secondary | ICD-10-CM | POA: Diagnosis not present

## 2016-12-10 DIAGNOSIS — M549 Dorsalgia, unspecified: Secondary | ICD-10-CM | POA: Diagnosis not present

## 2016-12-25 DIAGNOSIS — G5601 Carpal tunnel syndrome, right upper limb: Secondary | ICD-10-CM | POA: Diagnosis not present

## 2017-01-24 DIAGNOSIS — R202 Paresthesia of skin: Secondary | ICD-10-CM | POA: Diagnosis not present

## 2017-05-29 ENCOUNTER — Other Ambulatory Visit: Payer: Self-pay

## 2017-05-29 ENCOUNTER — Encounter (HOSPITAL_COMMUNITY): Payer: Self-pay | Admitting: Emergency Medicine

## 2017-05-29 ENCOUNTER — Ambulatory Visit (HOSPITAL_COMMUNITY)
Admission: EM | Admit: 2017-05-29 | Discharge: 2017-05-29 | Disposition: A | Discharge status: Home / Self Care | Payer: Medicare Other | Attending: Family Medicine | Admitting: Family Medicine

## 2017-05-29 ENCOUNTER — Ambulatory Visit (INDEPENDENT_AMBULATORY_CARE_PROVIDER_SITE_OTHER): Payer: Medicare Other

## 2017-05-29 DIAGNOSIS — R1030 Lower abdominal pain, unspecified: Secondary | ICD-10-CM

## 2017-05-29 DIAGNOSIS — R109 Unspecified abdominal pain: Secondary | ICD-10-CM | POA: Diagnosis not present

## 2017-05-29 DIAGNOSIS — K5900 Constipation, unspecified: Secondary | ICD-10-CM

## 2017-05-29 LAB — POCT URINALYSIS DIP (DEVICE)
Bilirubin Urine: NEGATIVE
Glucose, UA: NEGATIVE mg/dL
Hgb urine dipstick: NEGATIVE
Leukocytes, UA: NEGATIVE
NITRITE: NEGATIVE
PH: 5.5 (ref 5.0–8.0)
PROTEIN: NEGATIVE mg/dL
Specific Gravity, Urine: >=1.03 (ref 1.005–1.030)
Urobilinogen, UA: 0.2 mg/dL (ref 0.0–1.0)

## 2017-05-29 NOTE — ED Provider Notes (Signed)
[place]   [id number] 05/29/17 Arrival Time: 3474  ASSESSMENT & PLAN:  1. Abdominal discomfort   2. Constipation, unspecified constipation type    Recommend OTC MIralax and/or Magnesium Citrate. Encouraged fluid intake. Will f/u with his PCP or here if needed, the ED if abdominal discomfort worsens.  Reviewed expectations re: course of current medical issues. Questions answered. Outlined signs and symptoms indicating need for more acute intervention. Patient verbalized understanding. After Visit Summary given.   SUBJECTIVE:  Paul Brady is a 62 y.o. male who presents with complaint of intermittent abdominal discomfort. Onset gradual, over the past 3 days. Location: generalized; "feel it in different places" without radiation. Described as aching and cramping. Symptoms are unchanged since beginning. Aggravating factors: none. Alleviating factors: none. Associated symptoms: constipation. He denies arthralgias, belching, chills, fever, nausea, sweats and vomiting. Appetite: normal. PO intake: normal. Ambulatory without assistance. Urinary symptoms: none. Last bowel movement about 3 days ago and "small, like pebbles" and without blood. OTC treatment: none.  Past Surgical History:  Procedure Laterality Date  . ANTERIOR CERVICAL DECOMP/DISCECTOMY FUSION N/A 03/24/2013   Procedure: ACDF C5 - C7 2 LEVELS;  Surgeon: Melina Schools, MD;  Location: [facility];  Service: Orthopedics;  Laterality: N/A;  . CARPAL TUNNEL RELEASE     right   . HERNIA REPAIR  1980's  . INGUINAL HERNIA REPAIR Bilateral 10/14/2014   Procedure: LAPAROSCOPIC BILATERAL INGUINAL HERNIA REPAIRS WITH MESH;  Surgeon: Michael Boston, MD;  Location: [facility];  Service: General;  Laterality: Bilateral;  . WRIST FUSION Right 04/2012   ROS: As per HPI.  OBJECTIVE:  Vitals:   05/29/17 1551  BP: 139/87  Pulse: 73  Temp: 98.3 F (36.8 C)  TempSrc: Oral  SpO2: 100%    General appearance: alert; no distress Lungs:  clear to auscultation bilaterally Heart: regular rate and rhythm Abdomen: soft; non-distended; generalized mild tenderness; bowel sounds present; no masses or organomegaly; no guarding or rebound tenderness Back: no CVA tenderness; FROM at hips Extremities: no edema; symmetrical with no gross deformities Skin: warm and dry Neurologic: normal gait Psychological: alert and cooperative; normal mood and affect  Labs: Results for orders placed or performed during the hospital encounter of 05/29/17  POCT urinalysis dip (device)  Result Value Ref Range   Glucose, UA NEGATIVE NEGATIVE mg/dL   Bilirubin Urine NEGATIVE NEGATIVE   Ketones, ur TRACE (A) NEGATIVE mg/dL   Specific Gravity, Urine >=1.030 1.005 - 1.030   Hgb urine dipstick NEGATIVE NEGATIVE   pH 5.5 5.0 - 8.0   Protein, ur NEGATIVE NEGATIVE mg/dL   Urobilinogen, UA 0.2 0.0 - 1.0 mg/dL   Nitrite NEGATIVE NEGATIVE   Leukocytes, UA NEGATIVE NEGATIVE   Labs Reviewed  POCT URINALYSIS DIP (DEVICE) - Abnormal; Notable for the following components:      Result Value   Ketones, ur TRACE (*)    All other components within normal limits    Imaging: Dg Abd 2 Views  Result Date: 05/29/2017 CLINICAL DATA:  Lower abdominal pain history of diverticulitis. EXAM: ABDOMEN - 2 VIEW COMPARISON:  Body CT 07/23/2016 FINDINGS: The bowel gas pattern is normal. There is no evidence of free air. No radio-opaque calculi or other significant radiographic abnormality is seen. IMPRESSION: Negative. Electronically Signed   By: Fidela Salisbury M.D.   On: 05/29/2017 16:43    Allergies  Allergen Reactions  . Mold Extract [Trichophyton] Other (See Comments)    Per allergy testing.   . Trazodone And Nefazodone Other (See Comments)  Reaction:  Unknown                                                Past Medical History:  Diagnosis Date  . Chest pain   . Depression   . Diverticulitis   . Hyperlipidemia   . Hypertension    Social History    Socioeconomic History  . Marital status: Legally Separated    Spouse name: Not on file  . Number of children: Not on file  . Years of education: Not on file  . Highest education level: Not on file  Occupational History  . Not on file  Social Needs  . Financial resource strain: Not on file  . Food insecurity:    Worry: Not on file    Inability: Not on file  . Transportation needs:    Medical: Not on file    Non-medical: Not on file  Tobacco Use  . Smoking status: Current Every Day Smoker    Packs/day: 0.25    Years: 40.00    Pack years: 10.00    Types: Cigarettes  . Smokeless tobacco: Never Used  Substance and Sexual Activity  . Alcohol use: Yes    Alcohol/week: 0.0 oz    Comment: Occasionally  . Drug use: No  . Sexual activity: Not Currently  Lifestyle  . Physical activity:    Days per week: Not on file    Minutes per session: Not on file  . Stress: Not on file  Relationships  . Social connections:    Talks on phone: Not on file    Gets together: Not on file    Attends religious service: Not on file    Active member of club or organization: Not on file    Attends meetings of clubs or organizations: Not on file    Relationship status: Not on file  . Intimate partner violence:    Fear of current or ex partner: Not on file    Emotionally abused: Not on file    Physically abused: Not on file    Forced sexual activity: Not on file  Other Topics Concern  . Not on file  Social History Narrative   Lives alone in a one story home.  Has 2 sons.    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   On disability since 2010 for bipolar depression and chronic back pain   Family History  Problem Relation Age of Onset  . Hypertension Mother        Living 43  . Other Father        Work-related injury  . Mental illness Sister   . Mental illness Jackey Loge, MD 06/03/17 [phone]

## 2017-05-29 NOTE — ED Triage Notes (Signed)
C/o abdominal pain after eating onset 3 days. States BM is pebble size and he states when he urinates on occasion that is inadequate output

## 2017-05-29 NOTE — Discharge Instructions (Signed)
You may begin using over the counter Zantac 150mg  1-2 times daily (for acid reflux) and Miralax (for constipation).

## 2017-11-12 ENCOUNTER — Emergency Department (HOSPITAL_COMMUNITY)
Admission: EM | Admit: 2017-11-12 | Discharge: 2017-11-12 | Disposition: A | Discharge status: Home / Self Care | Payer: Medicare Other | Attending: Emergency Medicine | Admitting: Emergency Medicine

## 2017-11-12 ENCOUNTER — Emergency Department (HOSPITAL_COMMUNITY): Payer: Medicare Other

## 2017-11-12 ENCOUNTER — Encounter (HOSPITAL_COMMUNITY): Payer: Self-pay

## 2017-11-12 ENCOUNTER — Other Ambulatory Visit: Payer: Self-pay

## 2017-11-12 DIAGNOSIS — Z79899 Other long term (current) drug therapy: Secondary | ICD-10-CM | POA: Insufficient documentation

## 2017-11-12 DIAGNOSIS — F17210 Nicotine dependence, cigarettes, uncomplicated: Secondary | ICD-10-CM | POA: Diagnosis not present

## 2017-11-12 DIAGNOSIS — R030 Elevated blood-pressure reading, without diagnosis of hypertension: Secondary | ICD-10-CM | POA: Insufficient documentation

## 2017-11-12 DIAGNOSIS — N400 Enlarged prostate without lower urinary tract symptoms: Secondary | ICD-10-CM | POA: Insufficient documentation

## 2017-11-12 DIAGNOSIS — K5792 Diverticulitis of intestine, part unspecified, without perforation or abscess without bleeding: Secondary | ICD-10-CM | POA: Insufficient documentation

## 2017-11-12 DIAGNOSIS — R197 Diarrhea, unspecified: Secondary | ICD-10-CM | POA: Insufficient documentation

## 2017-11-12 DIAGNOSIS — I700 Atherosclerosis of aorta: Secondary | ICD-10-CM | POA: Diagnosis not present

## 2017-11-12 DIAGNOSIS — R1032 Left lower quadrant pain: Secondary | ICD-10-CM | POA: Diagnosis present

## 2017-11-12 LAB — URINALYSIS, ROUTINE W REFLEX MICROSCOPIC
BILIRUBIN URINE: NEGATIVE
Glucose, UA: NEGATIVE mg/dL
Hgb urine dipstick: NEGATIVE
KETONES UR: NEGATIVE mg/dL
LEUKOCYTES UA: NEGATIVE
NITRITE: NEGATIVE
Protein, ur: NEGATIVE mg/dL
SPECIFIC GRAVITY, URINE: 1.018 (ref 1.005–1.030)
pH: 5 (ref 5.0–8.0)

## 2017-11-12 LAB — CBC
HCT: 40.9 % (ref 39.0–52.0)
Hemoglobin: 13.9 g/dL (ref 13.0–17.0)
MCH: 29.4 pg (ref 26.0–34.0)
MCHC: 34 g/dL (ref 30.0–36.0)
MCV: 86.7 fL (ref 78.0–100.0)
Platelets: 257 10*3/uL (ref 150–400)
RBC: 4.72 MIL/uL (ref 4.22–5.81)
RDW: 15.1 % (ref 11.5–15.5)
WBC: 8 10*3/uL (ref 4.0–10.5)

## 2017-11-12 LAB — COMPREHENSIVE METABOLIC PANEL
ALK PHOS: 68 U/L (ref 38–126)
ALT: 13 U/L (ref 0–44)
AST: 16 U/L (ref 15–41)
Albumin: 4.2 g/dL (ref 3.5–5.0)
Anion gap: 7 (ref 5–15)
BILIRUBIN TOTAL: 0.5 mg/dL (ref 0.3–1.2)
BUN: 8 mg/dL (ref 6–20)
CALCIUM: 9.4 mg/dL (ref 8.9–10.3)
CO2: 29 mmol/L (ref 22–32)
Chloride: 108 mmol/L (ref 98–111)
Creatinine, Ser: 0.99 mg/dL (ref 0.61–1.24)
GLUCOSE: 99 mg/dL (ref 70–99)
POTASSIUM: 4.8 mmol/L (ref 3.5–5.1)
Sodium: 144 mmol/L (ref 135–145)
TOTAL PROTEIN: 7.3 g/dL (ref 6.5–8.1)

## 2017-11-12 LAB — LIPASE, BLOOD: Lipase: 39 U/L (ref 11–51)

## 2017-11-12 MED ORDER — IOPAMIDOL (ISOVUE-300) INJECTION 61%
100.0000 mL | Freq: Once | INTRAVENOUS | Status: AC | PRN
  Administered 2017-11-12: 100 mL via INTRAVENOUS

## 2017-11-12 MED ORDER — MORPHINE SULFATE (PF) 2 MG/ML IV SOLN
2.0000 mg | Freq: Once | INTRAVENOUS | Status: AC
  Administered 2017-11-12: 2 mg via INTRAVENOUS
  Filled 2017-11-12: qty 1

## 2017-11-12 MED ORDER — MORPHINE SULFATE (PF) 4 MG/ML IV SOLN
4.0000 mg | Freq: Once | INTRAVENOUS | Status: AC
  Administered 2017-11-12: 4 mg via INTRAVENOUS
  Filled 2017-11-12: qty 1

## 2017-11-12 MED ORDER — ONDANSETRON 4 MG PO TBDP: 4.0000 mg | ORAL_TABLET | Freq: Three times a day (TID) | ORAL | 0 refills | Status: DC | PRN

## 2017-11-12 MED ORDER — AMOXICILLIN-POT CLAVULANATE 875-125 MG PO TABS: 1.0000 | ORAL_TABLET | Freq: Two times a day (BID) | ORAL | 0 refills | Status: AC

## 2017-11-12 MED ORDER — SODIUM CHLORIDE 0.9 % IV BOLUS
500.0000 mL | Freq: Once | INTRAVENOUS | Status: AC
  Administered 2017-11-12: 500 mL via INTRAVENOUS

## 2017-11-12 MED ORDER — IOPAMIDOL (ISOVUE-300) INJECTION 61%
INTRAVENOUS | Status: AC
  Filled 2017-11-12: qty 100

## 2017-11-12 MED ORDER — ONDANSETRON HCL 4 MG/2ML IJ SOLN
4.0000 mg | Freq: Once | INTRAMUSCULAR | Status: AC
  Administered 2017-11-12: 4 mg via INTRAVENOUS
  Filled 2017-11-12: qty 2

## 2017-11-12 MED ORDER — SODIUM CHLORIDE 0.9 % IV BOLUS
1000.0000 mL | Freq: Once | INTRAVENOUS | Status: AC
  Administered 2017-11-12: 1000 mL via INTRAVENOUS

## 2017-11-12 MED ORDER — SODIUM CHLORIDE 0.9 % IV SOLN
3.0000 g | Freq: Once | INTRAVENOUS | Status: AC
  Administered 2017-11-12: 3 g via INTRAVENOUS
  Filled 2017-11-12: qty 3

## 2017-11-12 NOTE — ED Provider Notes (Signed)
[facility] DEPT Provider Note   CSN: [id number] Arrival date & time: 11/12/17  1212     History   Chief Complaint Chief Complaint  Patient presents with  . Abdominal Pain    HPI Paul Brady is a 62 y.o. male presenting for lower abdominal pain and bloating that began 3 days ago.  Patient states that the symptoms have progressed gradually over the past 3 days.  Patient states that his pain is a sharp moderate pain and he has lower abdomen that is worsened with movement and eating.  Patient states that pain increases suddenly to 10/10 in severity with movement and radiates to his groin and bilateral flanks.  Additionally patient states that he has had one episode of non-biliary/nonbloody emesis last night and persistent nausea since this occurrence.  Patient states that he has eaten a small amount of food without vomiting this morning.  Patient states that he has had small loose bowel movements over the past 3 days.  Patient denies blood in the stool.  Patient is denies history of fever.  Of note patient with history of diverticulitis with perforation and left inguinal hernia repair.  HPI  Past Medical History:  Diagnosis Date  . Chest pain   . Depression   . Diverticulitis   . Hyperlipidemia   . Hypertension     Patient Active Problem List   Diagnosis Date Noted  . Perforation of sigmoid colon due to diverticulitis   . Benign prostatic hyperplasia with incomplete bladder emptying   . Diverticulitis of colon with perforation 02/07/2016  . Depression 02/07/2016  . Elevated lipase 02/07/2016  . Diverticulitis large intestine 02/07/2016  . Bilateral inguinal hernia (BIH) s/p lap repair 10/14/2014 10/14/2014  . Leg weakness, bilateral 10/14/2014  . Lytic bone lesions on xray 10/04/2014  . Occipital neuralgia of right side 06/28/2014  . Suicidal ideations 04/12/2014  . Homicidal ideations 04/12/2014  . Major depressive disorder, recurrent  severe without psychotic features ([place]) 04/11/2014  . Chest pain at rest 08/26/2013  . Protein-calorie malnutrition, severe ([place]) 08/26/2013  . Cervical radicular pain 03/25/2013  . Neck pain 03/24/2013  . Chest pain 08/04/2012  . HTN (hypertension) 08/04/2012  . Hyperlipidemia 08/04/2012  . Tobacco abuse 08/04/2012    Past Surgical History:  Procedure Laterality Date  . ANTERIOR CERVICAL DECOMP/DISCECTOMY FUSION N/A 03/24/2013   Procedure: ACDF C5 - C7 2 LEVELS;  Surgeon: Melina Schools, MD;  Location: [facility];  Service: Orthopedics;  Laterality: N/A;  . CARPAL TUNNEL RELEASE     right   . HERNIA REPAIR  1980's  . INGUINAL HERNIA REPAIR Bilateral 10/14/2014   Procedure: LAPAROSCOPIC BILATERAL INGUINAL HERNIA REPAIRS WITH MESH;  Surgeon: Michael Boston, MD;  Location: [facility];  Service: General;  Laterality: Bilateral;  . WRIST FUSION Right 04/2012        Home Medications    Prior to Admission medications   Medication Sig Start Date End Date Taking? Authorizing Provider  ALPRAZolam [name]) 0.25 MG tablet Take 0.25 mg by mouth at bedtime as needed for anxiety.   Yes [provider]  amLODipine (NORVASC) 5 MG tablet Take 5 mg by mouth daily.  09/08/17  Yes [provider]  atorvastatin (LIPITOR) 20 MG tablet Take 20 mg by mouth daily.  09/08/17  Yes [provider]  buPROPion (WELLBUTRIN SR) 150 MG 12 hr tablet Take 150 mg by mouth 2 (two) times daily.  11/07/17  Yes [provider]  fluticasone (FLONASE) 50 MCG/ACT  nasal spray Place 1 spray into both nostrils at bedtime.  11/03/17  Yes [provider]  gabapentin (NEURONTIN) 600 MG tablet Take 600 mg by mouth 2 (two) times daily as needed (pain).  11/03/17  Yes [provider]  HYDROcodone-acetaminophen (NORCO) 10-325 MG tablet Take 1 tablet by mouth every 6 (six) hours as needed for moderate pain or severe pain.  11/03/17  Yes [provider]  omeprazole (PRILOSEC) 40 MG capsule Take  40 mg by mouth daily.  10/02/17  Yes [provider]  QUEtiapine (SEROQUEL) 400 MG tablet Take 400 mg by mouth at bedtime.  10/20/17  Yes [provider]  ranitidine (ZANTAC) 150 MG tablet Take 150 mg by mouth daily.  09/08/17  Yes [provider]  tamsulosin (FLOMAX) 0.4 MG CAPS capsule Take 0.4 mg by mouth daily.  09/08/17  Yes [provider]  albuterol (PROVENTIL HFA;VENTOLIN HFA) 108 (90 Base) MCG/ACT inhaler Inhale 2 puffs into the lungs every 4 (four) hours as needed for wheezing or shortness of breath. 08/06/16   Janne Napoleon, NP  amoxicillin-clavulanate (AUGMENTIN) 875-125 MG tablet Take 1 tablet by mouth every 12 (twelve) hours for 7 days. 11/12/17 11/19/17  Nuala Alpha A, PA-C  dicyclomine (BENTYL) 20 MG tablet Take 20 mg by mouth daily as needed for spasms.  09/29/17   [provider]  ondansetron (ZOFRAN ODT) 4 MG disintegrating tablet Take 1 tablet (4 mg total) by mouth every 8 (eight) hours as needed for nausea or vomiting. 11/12/17   Nuala Alpha A, PA-C  predniSONE (DELTASONE) 50 MG tablet 1 tab po daily for 6 days. Take with food. Start 08/07/16. Patient not taking: Reported on 11/12/2017 08/06/16   Janne Napoleon, NP    Family History Family History  Problem Relation Age of Onset  . Hypertension Mother        Living 64  . Other Father        Work-related injury  . Mental illness Sister   . Mental illness Sister     Social History Social History   Tobacco Use  . Smoking status: Current Every Day Smoker    Packs/day: 0.25    Years: 40.00    Pack years: 10.00    Types: Cigarettes  . Smokeless tobacco: Never Used  Substance Use Topics  . Alcohol use: Yes    Alcohol/week: 0.0 standard drinks    Comment: Occasionally  . Drug use: No     Allergies   Mold extract [trichophyton] and Trazodone and nefazodone   Review of Systems Review of Systems  Constitutional: Positive for appetite change and chills. Negative for fever.    HENT: Negative.  Negative for rhinorrhea and sore throat.   Eyes: Negative.  Negative for visual disturbance.  Respiratory: Negative.  Negative for cough and shortness of breath.   Cardiovascular: Negative.  Negative for chest pain.  Gastrointestinal: Positive for abdominal distention, abdominal pain, diarrhea, nausea and vomiting. Negative for blood in stool.  Genitourinary: Positive for flank pain. Negative for discharge, dysuria, hematuria, penile pain, penile swelling, scrotal swelling and testicular pain.  Musculoskeletal: Negative for arthralgias and myalgias.  Skin: Negative.  Negative for rash.  Neurological: Negative.  Negative for dizziness, weakness and headaches.     Physical Exam Updated Vital Signs BP (!) 141/96   Pulse 63   Temp 98.3 F (36.8 C) (Oral)   Resp 18   Ht 6\' 2"  (1.88 m)   Wt 89.8 kg   SpO2 100%  BMI 25.42 kg/m   Physical Exam  Constitutional: He is oriented to person, place, and time. He appears well-developed and well-nourished. No distress.  HENT:  Head: Normocephalic and atraumatic.  Right Ear: External ear normal.  Left Ear: External ear normal.  Nose: Nose normal.  Eyes: Pupils are equal, round, and reactive to light. EOM are normal.  Neck: Trachea normal and normal range of motion. No tracheal deviation present.  Cardiovascular: Normal rate, regular rhythm, normal heart sounds and intact distal pulses.  Pulmonary/Chest: Effort normal. No respiratory distress.  Abdominal: Soft. Bowel sounds are normal. There is tenderness in the suprapubic area and left lower quadrant. There is guarding. There is no rebound, no CVA tenderness, no tenderness at McBurney's point and negative Murphy's sign. No hernia. Hernia confirmed negative in the right inguinal area and confirmed negative in the left inguinal area.    Genitourinary: Testes normal and penis normal. Cremasteric reflex is present. Right testis shows no swelling and no tenderness. Left testis  shows no swelling and no tenderness.  Genitourinary Comments: Chaperone present during genital exam Rachel NT.  No external genital lesions noted, no bumps on head of penis, specifically no vesicles concerning for herpes or chancre suggestive of syphilis.  No pain with palpation of the penis/glans, no discharge or urethritis noted.  Scrotum and testicles without erythema/swelling or tenderness to palpation. Cremasteric reflex intact bilaterally.  No palpable hernia noted however with pain to palpation of the left inguinal area.   Musculoskeletal: Normal range of motion. He exhibits no edema or tenderness.  Neurological: He is alert and oriented to person, place, and time.  Skin: Skin is warm and dry.  Psychiatric: He has a normal mood and affect. His behavior is normal.     ED Treatments / Results  Labs (all labs ordered are listed, but only abnormal results are displayed) Labs Reviewed  LIPASE, BLOOD  COMPREHENSIVE METABOLIC PANEL  CBC  URINALYSIS, ROUTINE W REFLEX MICROSCOPIC    EKG None  Radiology Ct Abdomen Pelvis W Contrast  Result Date: 11/12/2017 CLINICAL DATA:  Three-day history of LEFT LOWER QUADRANT abdominal pain associated with diarrhea, vomiting and chills. Prior episodes of diverticulitis. EXAM: CT ABDOMEN AND PELVIS WITH CONTRAST TECHNIQUE: Multidetector CT imaging of the abdomen and pelvis was performed using the standard protocol following bolus administration of intravenous contrast. CONTRAST:  157mL ISOVUE-300 IOPAMIDOL INJECTION 61% IV. COMPARISON:  07/23/2016, 05/03/2016 and earlier. FINDINGS: Lower chest: Expected dependent atelectasis posteriorly in the lower lobes. Visualized lung bases otherwise clear. Normal heart size. Hepatobiliary: Liver normal in size and appearance. Gallbladder normal in appearance without calcified gallstones. No biliary ductal dilation. Pancreas: Normal in appearance without evidence of mass, ductal dilation, or inflammation. Spleen:  Normal in size and appearance. Adrenals/Urinary Tract: Normal appearing adrenal glands. Stable subcentimeter cortical cyst arising from the LOWER pole the RIGHT kidney. No significant focal parenchymal abnormality involving either kidney. No urinary tract calculi. No hydronephrosis. Urinary bladder decompressed with mild diffuse wall thickening. Stomach/Bowel: Stomach normal in appearance for the degree of distention. Normal-appearing small bowel. Diffuse colonic diverticulosis with mild pericolonic edema adjacent to the distal descending colon near its junction with the sigmoid colon. No abnormal fluid collection and no extraluminal gas. Normal appendix in the RIGHT UPPER pelvis. Vascular/Lymphatic: Moderate aortoiliofemoral atherosclerosis without evidence of aneurysm. Normal-appearing portal venous and systemic venous systems. No pathologic lymphadenopathy. Reproductive: Median lobe prostate gland enlargement as noted previously. Normal-appearing seminal vesicles. Other: Phleboliths low in both sides of the pelvis. Musculoskeletal: Degenerative  disc disease at L2-3. Slight thoracolumbar scoliosis. Mild lower thoracic spondylosis. No acute findings. IMPRESSION: 1. Acute diverticulitis involving the distal descending colon. No evidence of abscess or perforation. 2. Diffuse colonic diverticulosis. 3. Median lobe prostate gland enlargement. 4. Diffuse wall thickening involving the urinary bladder which may be due to the fact that the bladder is decompressed. However, in light of the chronic prostate gland enlargement, chronic outlet obstruction with muscular hypertrophy could be present. 5.  Aortic Atherosclerosis (ICD10-170.0) Electronically Signed   By: Evangeline Dakin M.D.   On: 11/12/2017 17:00    Procedures Procedures (including critical care time)  Medications Ordered in ED Medications  iopamidol (ISOVUE-300) 61 % injection (has no administration in time range)  sodium chloride 0.9 % bolus 1,000 mL (0  mLs Intravenous Stopped 11/12/17 1653)  ondansetron (ZOFRAN) injection 4 mg (4 mg Intravenous Given 11/12/17 1551)  morphine 4 MG/ML injection 4 mg (4 mg Intravenous Given 11/12/17 1552)  iopamidol (ISOVUE-300) 61 % injection 100 mL (100 mLs Intravenous Contrast Given 11/12/17 1623)  Ampicillin-Sulbactam (UNASYN) 3 g in sodium chloride 0.9 % 100 mL IVPB (0 g Intravenous Stopped 11/12/17 1822)  sodium chloride 0.9 % bolus 500 mL (0 mLs Intravenous Stopped 11/12/17 1845)  morphine 2 MG/ML injection 2 mg (2 mg Intravenous Given 11/12/17 1822)     Initial Impression / Assessment and Plan / ED Course  I have reviewed the triage vital signs and the nursing notes.  Pertinent labs & imaging results that were available during my care of the patient were reviewed by me and considered in my medical decision making (see chart for details).  Clinical Course as of Nov 13 1910  Wed Nov 12, 2017  [place] PMP aware shows that the patient receives 30-day supplies of hydrocodone/acetaminophen every month, last filled prescription on 11/03/2017.   [BM]    Clinical Course User Index [BM] Deliah Boston, PA-C   62 year old male with history of diverticulitis with perforation and left inguinal hernia repair presenting for 3 days of lower abdominal pain and bloating.  On examination patient is acutely tender with movement and palpation to that area. CBC within normal limits, lipase within normal limits, CMP within normal limits, UA within normal limits. CT abdomen pelvis ordered due to patient's acute pain on physical exam and history of diverticulitis.  CT shows acute diverticulitis without perforation or abscess.  Patient given 3 g of Unasyn here in the emergency department.  Patient given IV fluids.  Pain managed w/ morphine in the emergency department.  Patient given prescription for 7-day course of Augmentin and informed that he must follow-up with his primary care provider as soon as possible and return to the  emergency department for new or worsening symptoms or if his symptoms do not improve.  Patient also given Zofran for nausea. Patient with regular prescription of Vicodin every month, was not given additional narcotic medication prescription today.  The patient was noted to have elevated BP in ED today. Hx of HTN and has not taken daily medications today. I have spoken with the patient regarding elevated blood pressure readings and the need for improved management. I instructed the patient to followup with their PCP within 1 week for BP check. I also counseled the patient regarding the signs and symptoms which would require an emergent visit to an emergency department for hypertensive urgency and/or hypertensive emergency.  Patient informed of prostate enlargement and aortic atherosclerosis and need to follow-up with primary care provider.  Patient is  afebrile, not tachycardic, not hypotensive, well-appearing no acute distress at this time.  At this time there does not appear to be any evidence of an acute emergency medical condition and the patient appears stable for discharge with appropriate outpatient follow up. Diagnosis was discussed with patient who verbalizes understanding of care plan and is agreeable to discharge. I have discussed return precautions with patient whos verbalize understanding of return precautions. Patient strongly encouraged to follow-up with their PCP. All questions answered.  Patient's case discussed with Dr. Ashok Cordia who agrees with plan to discharge with follow-up.     Note: Portions of this report may have been transcribed using voice recognition software. Every effort was made to ensure accuracy; however, inadvertent computerized transcription errors may still be present.     Final Clinical Impressions(s) / ED Diagnoses   Final diagnoses:  Acute diverticulitis  Prostate enlargement  Aortic atherosclerosis ([place])  Elevated blood pressure reading    ED Discharge  Orders         Ordered    amoxicillin-clavulanate (AUGMENTIN) 875-125 MG tablet  Every 12 hours     11/12/17 1831    ondansetron (ZOFRAN ODT) 4 MG disintegrating tablet  Every 8 hours PRN     11/12/17 1834           Deliah Boston, PA-C 11/12/17 1925    Lajean Saver, MD 11/12/17 1947

## 2017-11-12 NOTE — Discharge Instructions (Addendum)
Please return to the Emergency Department for any new or worsening symptoms or if your symptoms do not improve. Please be sure to follow up with your Primary Care Physician as soon as possible regarding your visit today. If you do not have a Primary Doctor please use the resources below to establish one. Your CT scan today showed diverticulitis.  Please be sure to take all of your antibiotic medication Augmentin as prescribed for the next 7 days. You may use the Zofran prescribed to help with nausea. The CT today also showed enlargement of your prostate gland, please be sure to speak with your primary care provider regarding this.  It also showed some plaque buildup on your aorta also speak to your primary care provider regarding this as well. Your blood pressure was also elevated today.  Please be sure to follow-up with your primary care provider regarding this as well and take your medication as prescribed.  Contact a health care provider if: Your pain does not improve. You have a hard time drinking or eating food. Your bowel movements do not return to normal. Get help right away if: Your pain gets worse. Your symptoms do not get better with treatment. Your symptoms suddenly get worse. You have a fever. You vomit more than one time. You have stools that are bloody, black, or tarry. Contact a health care provider if: You have abdominal pain that: Gets worse. Stays in one area (localizes). You have a rash. You have a stiff neck. You are more irritable than usual. You are sleepier or more difficult to wake up than usual. You feel weak or dizzy. You feel very thirsty. You have urinated only a small amount of very dark urine over 6-8 hours. Get help right away if: You have symptoms of severe dehydration. You cannot drink fluids without vomiting. Your symptoms get worse with treatment. You have a fever. You have a severe headache. You have vomiting or diarrhea that: Gets worse. Does  not go away. You have blood or green matter (bile) in your vomit. You have blood in your stool. This may cause stool to look black and tarry. You have not urinated in 6-8 hours. You faint. Your heart rate while sitting still is over 100 beats a minute. You have trouble breathing.  RESOURCE GUIDE  Chronic Pain Problems: Contact [facility]  [phone] Patients need to be referred by their primary care doctor.  Insufficient Money for Medicine: Contact [employer]:  call "211" or [place] [phone].  No Primary Care Doctor: Call [facility]  [phone] - can help you locate a primary care doctor that  accepts your insurance, provides certain services, etc. Physician Referral [phone]  Agencies that provide inexpensive medical care: [facility]  [facility]  [phone] [facility]  [phone] [facility]  [phone] Planned Parenthood  [phone] [facility]  [phone]  [place] Providers: [facility]- [address], [address]  [phone], Mon-Fri 9am-7pm, Sat 9am-1pm [place], [address] [place]  [place], [address] [place]  [place]- [address]  [place], [address], [phone]  Only accepts [place] patients after they have their name  applied to their card  Self Pay (no insurance) in [place] [facility]: Sickle Cell Patients: Dr Kevan Ny, [place]  [facility]  [place], [facility]- [place]  [facility] [place]- [address] [place], [facility]- see information above (Speak to [employer] if you do not have insurance)       -  [facility]- [place], [place],  [place], [place]  Dr Vista Lawman-  [address], [place], [place] [facility]- [address] [phone]       -  [facility] [place]- [address] [place], [place], also [address] [phone]       -    [facility]- [address], [place], 1st & 3rd Saturday   every month, 10am-1pm  1) Find a Doctor and Pay Out of Pocket Although you won't have to find out who is covered by your insurance plan, it is a good idea to ask around and get recommendations. You will then need to call the office and see if the doctor you have chosen will accept you as a new patient and what types of options they offer for patients who are self-pay. Some doctors offer discounts or will set up payment plans for their patients who do not have insurance, but you will need to ask so you aren't surprised when you get to your appointment.  2) Contact Your Local Health Department Not all health departments have doctors that can see patients for sick visits, but many do, so it is worth a call to see if yours does. If you don't know where your local health department is, you can check in your phone book. The CDC also has a tool to help you locate your state's health department, and many state websites also have listings of all of their local health departments.  3) Find a [facility] If your illness is not likely to be very severe or complicated, you may want to try a walk in clinic. These are popping up all over the country in pharmacies, drugstores, and shopping centers. They're usually staffed by nurse practitioners or physician assistants that have been trained to treat common illnesses and complaints. They're usually fairly quick and inexpensive. However, if you have serious medical issues or chronic medical problems, these  are probably not your best option  [facility] [place], [facility], [address] [place], phone [phone] or [phone].  Monday - Friday, call for an appointment. [place], [facility], [address], [place], phone [phone] or [phone].  Monday - Friday, call for an appointment.  Abuse/Neglect: [place] [phone] [place] [phone] (After Hours)  Emergency Shelter:  [facility] [phone]  Maternity Homes: Room at the [place] [phone] [place] [phone]  MRSA Hotline #:   986 611 7165  [facility] [place] Dept. [address]  [place] Phone:  [phone]                                  Phone:  [phone]                   Phone:  [phone]  [facility], [place]- [phone]       -     [facility] in [place], [address],                                  [place] [phone] or [phone] (After Hours)   [place]  Substance Abuse Resources: [facility]  [phone] [place] [phone] [facility] [place] Chinita Pester [phone] Residential & Outpatient Substance Abuse Program  [phone]  Psychological Services: [place]  [phone] [place]  [place], [place]. [address] [place], [place]: [phone] or  [phone], [email or web address]  Dental Assistance  If unable to pay or uninsured, contact:  [facility] or [facility]. to become qualified for the adult dental clinic.  Patients with Medicaid: [facility] [phone] [address], [place] [address] [phone]  If unable to pay, or uninsured, contact [facility] [phone]) or [place] [phone] in [place], [place] in [facility]) to become qualified for the adult dental clinic   Other [place]- [place], [place], [place], [address], [place], [place], 2nd and 4th Thursday of the month at 6:30am.  10 clients each day by appointment, can sometimes see walk-in patients if someone does not show for an appointment. [facility]- [address] Hillard Danker [place], [place], [address], [place], [place], [place], [address], [place] Department- [phone] [facility] [phone]

## 2017-11-12 NOTE — ED Triage Notes (Addendum)
Pt states lower abd pain x 3 days. Pt states that he had emesis starting last night. Pt states that the pain increases at night. Pt states that he has been having small slimy BMs.  Pt states that when he eats, he'll be okay for a couple of minutes, but then the pain will hit

## 2017-12-05 ENCOUNTER — Other Ambulatory Visit: Payer: Self-pay

## 2017-12-05 ENCOUNTER — Emergency Department (HOSPITAL_COMMUNITY)
Admission: EM | Admit: 2017-12-05 | Discharge: 2017-12-05 | Disposition: A | Discharge status: Home / Self Care | Payer: Medicare Other | Attending: Emergency Medicine | Admitting: Emergency Medicine

## 2017-12-05 ENCOUNTER — Encounter (HOSPITAL_COMMUNITY): Payer: Self-pay

## 2017-12-05 ENCOUNTER — Emergency Department (HOSPITAL_COMMUNITY): Payer: Medicare Other

## 2017-12-05 DIAGNOSIS — F17210 Nicotine dependence, cigarettes, uncomplicated: Secondary | ICD-10-CM | POA: Diagnosis not present

## 2017-12-05 DIAGNOSIS — Z79899 Other long term (current) drug therapy: Secondary | ICD-10-CM | POA: Insufficient documentation

## 2017-12-05 DIAGNOSIS — Y9302 Activity, running: Secondary | ICD-10-CM | POA: Diagnosis not present

## 2017-12-05 DIAGNOSIS — S86011A Strain of right Achilles tendon, initial encounter: Secondary | ICD-10-CM

## 2017-12-05 DIAGNOSIS — M79661 Pain in right lower leg: Secondary | ICD-10-CM | POA: Diagnosis present

## 2017-12-05 DIAGNOSIS — I10 Essential (primary) hypertension: Secondary | ICD-10-CM | POA: Insufficient documentation

## 2017-12-05 DIAGNOSIS — M66361 Spontaneous rupture of flexor tendons, right lower leg: Secondary | ICD-10-CM | POA: Diagnosis not present

## 2017-12-05 MED ORDER — OXYCODONE-ACETAMINOPHEN 5-325 MG PO TABS
1.0000 | ORAL_TABLET | Freq: Once | ORAL | Status: AC
  Administered 2017-12-05: 1 via ORAL
  Filled 2017-12-05: qty 1

## 2017-12-05 MED ORDER — MORPHINE SULFATE (PF) 4 MG/ML IV SOLN
4.0000 mg | Freq: Once | INTRAVENOUS | Status: AC
  Administered 2017-12-05: 4 mg via INTRAMUSCULAR
  Filled 2017-12-05: qty 1

## 2017-12-05 NOTE — ED Provider Notes (Signed)
[facility] DEPT Provider Note   CSN: [id number] Arrival date & time: 12/05/17  1919     History   Chief Complaint Chief Complaint  Patient presents with  . Leg Pain    HPI Paul Brady is a 62 y.o. male.  HPI   Pt is a 62 y/o male with a h/o depression, diverticulitis, HLD, HTN who presents to the ED today c/o pain to the RLE that began suddenly PTA. Pt states that he was running PTA and suddenly felt and heard a loud pop to the back part of the calf area. States that he immediately felt a tingling sensation to his foot that has been present since. He reports that pain is severe and constant in nature. Worse with movement or palpation. Denies any other injuries and denies that he fell to the ground.  Past Medical History:  Diagnosis Date  . Chest pain   . Depression   . Diverticulitis   . Hyperlipidemia   . Hypertension     Patient Active Problem List   Diagnosis Date Noted  . Perforation of sigmoid colon due to diverticulitis   . Benign prostatic hyperplasia with incomplete bladder emptying   . Diverticulitis of colon with perforation 02/07/2016  . Depression 02/07/2016  . Elevated lipase 02/07/2016  . Diverticulitis large intestine 02/07/2016  . Bilateral inguinal hernia (BIH) s/p lap repair 10/14/2014 10/14/2014  . Leg weakness, bilateral 10/14/2014  . Lytic bone lesions on xray 10/04/2014  . Occipital neuralgia of right side 06/28/2014  . Suicidal ideations 04/12/2014  . Homicidal ideations 04/12/2014  . Major depressive disorder, recurrent severe without psychotic features ([place]) 04/11/2014  . Chest pain at rest 08/26/2013  . Protein-calorie malnutrition, severe ([place]) 08/26/2013  . Cervical radicular pain 03/25/2013  . Neck pain 03/24/2013  . Chest pain 08/04/2012  . HTN (hypertension) 08/04/2012  . Hyperlipidemia 08/04/2012  . Tobacco abuse 08/04/2012    Past Surgical History:  Procedure Laterality Date  . ANTERIOR  CERVICAL DECOMP/DISCECTOMY FUSION N/A 03/24/2013   Procedure: ACDF C5 - C7 2 LEVELS;  Surgeon: Melina Schools, MD;  Location: [facility];  Service: Orthopedics;  Laterality: N/A;  . CARPAL TUNNEL RELEASE     right   . HERNIA REPAIR  1980's  . INGUINAL HERNIA REPAIR Bilateral 10/14/2014   Procedure: LAPAROSCOPIC BILATERAL INGUINAL HERNIA REPAIRS WITH MESH;  Surgeon: Michael Boston, MD;  Location: [facility];  Service: General;  Laterality: Bilateral;  . WRIST FUSION Right 04/2012        Home Medications    Prior to Admission medications   Medication Sig Start Date End Date Taking? Authorizing Provider  albuterol (PROVENTIL HFA;VENTOLIN HFA) 108 (90 Base) MCG/ACT inhaler Inhale 2 puffs into the lungs every 4 (four) hours as needed for wheezing or shortness of breath. 08/06/16   Mabe, Shanon Brow, NP  ALPRAZolam [name]) 0.25 MG tablet Take 0.25 mg by mouth at bedtime as needed for anxiety.    [provider]  amLODipine (NORVASC) 5 MG tablet Take 5 mg by mouth daily.  09/08/17   [provider]  atorvastatin (LIPITOR) 20 MG tablet Take 20 mg by mouth daily.  09/08/17   [provider]  buPROPion (WELLBUTRIN SR) 150 MG 12 hr tablet Take 150 mg by mouth 2 (two) times daily.  11/07/17   [provider]  dicyclomine (BENTYL) 20 MG tablet Take 20 mg by mouth daily as needed for spasms.  09/29/17   [provider]  fluticasone (FLONASE) 50  MCG/ACT nasal spray Place 1 spray into both nostrils at bedtime.  11/03/17   [provider]  gabapentin (NEURONTIN) 600 MG tablet Take 600 mg by mouth 2 (two) times daily as needed (pain).  11/03/17   [provider]  HYDROcodone-acetaminophen (NORCO) 10-325 MG tablet Take 1 tablet by mouth every 6 (six) hours as needed for moderate pain or severe pain.  11/03/17   [provider]  omeprazole (PRILOSEC) 40 MG capsule Take 40 mg by mouth daily.  10/02/17   [provider]  ondansetron (ZOFRAN ODT) 4 MG  disintegrating tablet Take 1 tablet (4 mg total) by mouth every 8 (eight) hours as needed for nausea or vomiting. 11/12/17   Nuala Alpha A, PA-C  predniSONE (DELTASONE) 50 MG tablet 1 tab po daily for 6 days. Take with food. Start 08/07/16. Patient not taking: Reported on 11/12/2017 08/06/16   Janne Napoleon, NP  QUEtiapine (SEROQUEL) 400 MG tablet Take 400 mg by mouth at bedtime.  10/20/17   [provider]  ranitidine (ZANTAC) 150 MG tablet Take 150 mg by mouth daily.  09/08/17   [provider]  tamsulosin (FLOMAX) 0.4 MG CAPS capsule Take 0.4 mg by mouth daily.  09/08/17   [provider]    Family History Family History  Problem Relation Age of Onset  . Hypertension Mother        Living 58  . Other Father        Work-related injury  . Mental illness Sister   . Mental illness Sister     Social History Social History   Tobacco Use  . Smoking status: Current Every Day Smoker    Packs/day: 0.25    Years: 40.00    Pack years: 10.00    Types: Cigarettes  . Smokeless tobacco: Never Used  Substance Use Topics  . Alcohol use: Yes    Alcohol/week: 0.0 standard drinks    Comment: Occasionally  . Drug use: No     Allergies   Mold extract [trichophyton] and Trazodone and nefazodone   Review of Systems Review of Systems  Constitutional: Negative for chills and fever.  Respiratory: Negative for shortness of breath.   Cardiovascular: Negative for chest pain.  Musculoskeletal:       RLE pain  Neurological: Positive for numbness.     Physical Exam Updated Vital Signs BP (!) 147/110 (BP Location: Left Arm)   Pulse 86   Temp 99 F (37.2 C) (Oral)   Resp 18   Ht 6\' 2"  (1.88 m)   Wt 89.8 kg   SpO2 100%   BMI 25.42 kg/m   Physical Exam  Constitutional: He appears well-developed and well-nourished. He appears distressed.  HENT:  Head: Normocephalic and atraumatic.  Eyes: Conjunctivae are normal.  Neck: Neck supple.  Cardiovascular: Normal rate.    Pulmonary/Chest: Effort normal.  Abdominal: Soft.  Musculoskeletal:  TTP over the distal attachment of the achilles tendon and up the calf. No medial or lateral malleolar TTP and no significant TTP throughout the foot. NVI. +Thompson test. Able to slightly dorsiflex foot. Unable to plantarflex foot.  Neurological: He is alert.  Skin: Skin is warm and dry.  Psychiatric: He has a normal mood and affect.  Nursing note and vitals reviewed.    ED Treatments / Results  Labs (all labs ordered are listed, but only abnormal results are displayed) Labs Reviewed - No data to display  EKG None  Radiology Dg Os Calcis Right  Result Date: 12/05/2017  CLINICAL DATA:  Pain. EXAM: RIGHT OS CALCIS - 2+ VIEW COMPARISON:  None. FINDINGS: Soft tissue swelling.  No fracture identified. IMPRESSION: No fracture noted.  Soft tissue swelling. Electronically Signed   By: Dorise Bullion III M.D   On: 12/05/2017 21:09    Procedures Procedures (including critical care time) SPLINT APPLICATION Date/Time: 2:70 PM Authorized by: Rodney Booze Consent: Verbal consent obtained. Risks and benefits: risks, benefits and alternatives were discussed Consent given by: patient Splint applied by: orthopedic technician Location details: RLE Splint type: posterior short leg splint Post-procedure: The splinted body part was neurovascularly unchanged following the procedure. Patient tolerance: Patient tolerated the procedure well with no immediate complications.   Medications Ordered in ED Medications  morphine 4 MG/ML injection 4 mg (4 mg Intramuscular Given 12/05/17 2011)  oxyCODONE-acetaminophen (PERCOCET/ROXICET) 5-325 MG per tablet 1 tablet (1 tablet Oral Given 12/05/17 2114)     Initial Impression / Assessment and Plan / ED Course  I have reviewed the triage vital signs and the nursing notes.  Pertinent labs & imaging results that were available during my care of the patient were reviewed by me and  considered in my medical decision making (see chart for details).  Discussed pt presentation and exam findings with Dr. Vanita Panda, who evaluated pt and agrees with the plan to splint pt and have pt f/u with ortho.  Reviewed pts chart in [place] narcotic database. He sees pain management and has active Rx for hydrocodone-acetaminophen 10-325. Last Rx was filled yesterday. Will have him take his regular Rx for pain and hold on any new rx at this time.  Final Clinical Impressions(s) / ED Diagnoses   Final diagnoses:  Achilles tendon rupture, right, initial encounter   62 y/o male presents to the ED today c/o pain to the RLE that began after hearing and feeling a loud pop to the distal posterior calf. TTP along the achilles tendon with +Thompson test. Pt unable to plantarflex foot. No TTP throughout the remainder of the foot and ankle. Xray without evidence of calcaneal avulsion fracture. Clinically pt has a ruptured achilles tendon. Will place pt in short leg posterior splint and given crutches. He was advised to be non-weight bearing and was given a referral to the orthopedic doctor for follow up. Advised to f/u next week and to return for any new or worsening sxs. Pt voices an understanding of plan and reason to return to the ED.  ED Discharge Orders    None       Bishop [place] 12/05/17 2144    Carmin Muskrat, MD 12/05/17 2212

## 2017-12-05 NOTE — ED Triage Notes (Signed)
Pt states he was running when he heard a loud pop and a pain up his leg.

## 2017-12-05 NOTE — Discharge Instructions (Addendum)
°  Please do not put weight on your right leg. Please  follow up with the orthopedic doctor within the next 7 days for re-evaluation and further treatment of your symptoms.   Please return to the ER sooner if you have any new or worsening symptoms.

## 2017-12-05 NOTE — ED Triage Notes (Signed)
Pt arrives via EMS. Per their report, he was running across the road, felt a pop in the right lower leg. Then he felt pain in the calf area, it went numb, then pain started below the knee to the toes. 142/90, hr 100, 94%, 18R.

## 2017-12-31 NOTE — Progress Notes (Signed)
Please place orders in Epic as patient is being scheduled for a pre-op appointment! Thank you! 

## 2018-01-01 NOTE — Patient Instructions (Signed)
Paul Brady  01/01/2018   Your procedure is scheduled on: Wednesday 01/07/2018  Report to [facility] Main  Entrance              Report to admitting at   [place] AM    Call this number if you have problems the morning of surgery [phone]    Remember: Do not eat food or drink liquids :After Midnight.              BRUSH YOUR TEETH MORNING OF SURGERY AND RINSE YOUR MOUTH OUT, NO CHEWING GUM CANDY OR MINTS.     Take these medicines the morning of surgery with A SIP OF WATER: Amlodipine (Norvasc), Bupropion (Wellbutrin), Gabapentin (Neurontin), Lamotrigine (Lamictal), Omeprazole (Prilosec), use Albuterol inhaler if needed and bring inhaler with you to the hospital                                You may not have any metal on your body including hair pins and              piercings  Do not wear jewelry, make-up, lotions, powders or perfumes, deodorant                        Men may shave face and neck.   Do not bring valuables to the hospital. [place].  Contacts, dentures or bridgework may not be worn into surgery.  Leave suitcase in the car. After surgery it may be brought to your room.                  Please read over the following fact sheets you were given: _____________________________________________________________________             [facility] - Preparing for Surgery Before surgery, you can play an important role.  Because skin is not sterile, your skin needs to be as free of germs as possible.  You can reduce the number of germs on your skin by washing with CHG (chlorahexidine gluconate) soap before surgery.  CHG is an antiseptic cleaner which kills germs and bonds with the skin to continue killing germs even after washing. Please DO NOT use if you have an allergy to CHG or antibacterial soaps.  If your skin becomes reddened/irritated stop using the CHG and inform your nurse when you arrive at  Short Stay. Do not shave (including legs and underarms) for at least 48 hours prior to the first CHG shower.  You may shave your face/neck. Please follow these instructions carefully:  1.  Shower with CHG Soap the night before surgery and the  morning of Surgery.  2.  If you choose to wash your hair, wash your hair first as usual with your  normal  shampoo.  3.  After you shampoo, rinse your hair and body thoroughly to remove the  shampoo.                           4.  Use CHG as you would any other liquid soap.  You can apply chg directly  to the skin and wash  Gently with a scrungie or clean washcloth.  5.  Apply the CHG Soap to your body ONLY FROM THE NECK DOWN.   Do not use on face/ open                           Wound or open sores. Avoid contact with eyes, ears mouth and genitals (private parts).                       Wash face,  Genitals (private parts) with your normal soap.             6.  Wash thoroughly, paying special attention to the area where your surgery  will be performed.  7.  Thoroughly rinse your body with warm water from the neck down.  8.  DO NOT shower/wash with your normal soap after using and rinsing off  the CHG Soap.                9.  Pat yourself dry with a clean towel.            10.  Wear clean pajamas.            11.  Place clean sheets on your bed the night of your first shower and do not  sleep with pets. Day of Surgery : Do not apply any lotions/deodorants the morning of surgery.  Please wear clean clothes to the hospital/surgery center.  FAILURE TO FOLLOW THESE INSTRUCTIONS MAY RESULT IN THE CANCELLATION OF YOUR SURGERY PATIENT SIGNATURE_________________________________  NURSE SIGNATURE__________________________________  ________________________________________________________________________   Paul Brady  An incentive spirometer is a tool that can help keep your lungs clear and active. This tool measures how well you are  filling your lungs with each breath. Taking long deep breaths may help reverse or decrease the chance of developing breathing (pulmonary) problems (especially infection) following:  A long period of time when you are unable to move or be active. BEFORE THE PROCEDURE   If the spirometer includes an indicator to show your best effort, your nurse or respiratory therapist will set it to a desired goal.  If possible, sit up straight or lean slightly forward. Try not to slouch.  Hold the incentive spirometer in an upright position. INSTRUCTIONS FOR USE  1. Sit on the edge of your bed if possible, or sit up as far as you can in bed or on a chair. 2. Hold the incentive spirometer in an upright position. 3. Breathe out normally. 4. Place the mouthpiece in your mouth and seal your lips tightly around it. 5. Breathe in slowly and as deeply as possible, raising the piston or the ball toward the top of the column. 6. Hold your breath for 3-5 seconds or for as long as possible. Allow the piston or ball to fall to the bottom of the column. 7. Remove the mouthpiece from your mouth and breathe out normally. 8. Rest for a few seconds and repeat Steps 1 through 7 at least 10 times every 1-2 hours when you are awake. Take your time and take a few normal breaths between deep breaths. 9. The spirometer may include an indicator to show your best effort. Use the indicator as a goal to work toward during each repetition. 10. After each set of 10 deep breaths, practice coughing to be sure your lungs are clear. If you have an incision (the cut made at the time of surgery),  support your incision when coughing by placing a pillow or rolled up towels firmly against it. Once you are able to get out of bed, walk around indoors and cough well. You may stop using the incentive spirometer when instructed by your caregiver.  RISKS AND COMPLICATIONS  Take your time so you do not get dizzy or light-headed.  If you are in pain,  you may need to take or ask for pain medication before doing incentive spirometry. It is harder to take a deep breath if you are having pain. AFTER USE  Rest and breathe slowly and easily.  It can be helpful to keep track of a log of your progress. Your caregiver can provide you with a simple table to help with this. If you are using the spirometer at home, follow these instructions: [place] IF:   You are having difficultly using the spirometer.  You have trouble using the spirometer as often as instructed.  Your pain medication is not giving enough relief while using the spirometer.  You develop fever of 100.5 F (38.1 C) or higher. SEEK IMMEDIATE MEDICAL CARE IF:   You cough up bloody sputum that had not been present before.  You develop fever of 102 F (38.9 C) or greater.  You develop worsening pain at or near the incision site. MAKE SURE YOU:   Understand these instructions.  Will watch your condition.  Will get help right away if you are not doing well or get worse. Document Released: 07/08/2006 Document Revised: 05/20/2011 Document Reviewed: 09/08/2006 [facility] Patient Information 2014 [place], [place].   ________________________________________________________________________

## 2018-01-02 ENCOUNTER — Encounter (HOSPITAL_COMMUNITY): Payer: Self-pay

## 2018-01-02 ENCOUNTER — Other Ambulatory Visit: Payer: Self-pay

## 2018-01-02 ENCOUNTER — Encounter (HOSPITAL_COMMUNITY)
Admission: RE | Admit: 2018-01-02 | Discharge: 2018-01-02 | Disposition: A | Discharge status: Home / Self Care | Payer: Medicare Other | Source: Ambulatory Visit | Attending: Orthopedic Surgery | Admitting: Orthopedic Surgery

## 2018-01-02 DIAGNOSIS — X58XXXA Exposure to other specified factors, initial encounter: Secondary | ICD-10-CM | POA: Insufficient documentation

## 2018-01-02 DIAGNOSIS — Z01812 Encounter for preprocedural laboratory examination: Secondary | ICD-10-CM | POA: Diagnosis not present

## 2018-01-02 DIAGNOSIS — S86011A Strain of right Achilles tendon, initial encounter: Secondary | ICD-10-CM | POA: Diagnosis not present

## 2018-01-02 LAB — CBC WITH DIFFERENTIAL/PLATELET
Abs Immature Granulocytes: 0.03 10*3/uL (ref 0.00–0.07)
Basophils Absolute: 0 10*3/uL (ref 0.0–0.1)
Basophils Relative: 0 %
Eosinophils Absolute: 0.2 10*3/uL (ref 0.0–0.5)
Eosinophils Relative: 2 %
HCT: 41.3 % (ref 39.0–52.0)
Hemoglobin: 13.3 g/dL (ref 13.0–17.0)
Immature Granulocytes: 0 %
Lymphocytes Relative: 24 %
Lymphs Abs: 2.3 10*3/uL (ref 0.7–4.0)
MCH: 28.7 pg (ref 26.0–34.0)
MCHC: 32.2 g/dL (ref 30.0–36.0)
MCV: 89 fL (ref 80.0–100.0)
Monocytes Absolute: 0.8 10*3/uL (ref 0.1–1.0)
Monocytes Relative: 8 %
Neutro Abs: 6.3 10*3/uL (ref 1.7–7.7)
Neutrophils Relative %: 66 %
Platelets: 264 10*3/uL (ref 150–400)
RBC: 4.64 MIL/uL (ref 4.22–5.81)
RDW: 15.1 % (ref 11.5–15.5)
WBC: 9.6 10*3/uL (ref 4.0–10.5)
nRBC: 0 % (ref 0.0–0.2)

## 2018-01-02 LAB — COMPREHENSIVE METABOLIC PANEL
ALT: 18 U/L (ref 0–44)
AST: 21 U/L (ref 15–41)
Albumin: 3.8 g/dL (ref 3.5–5.0)
Alkaline Phosphatase: 67 U/L (ref 38–126)
Anion gap: 5 (ref 5–15)
BUN: 12 mg/dL (ref 6–20)
CO2: 28 mmol/L (ref 22–32)
Calcium: 9.1 mg/dL (ref 8.9–10.3)
Chloride: 109 mmol/L (ref 98–111)
Creatinine, Ser: 0.93 mg/dL (ref 0.61–1.24)
GFR calc Af Amer: >60 mL/min (ref >60)
GFR calc non Af Amer: >60 mL/min (ref >60)
Glucose, Bld: 98 mg/dL (ref 70–99)
Potassium: 4.6 mmol/L (ref 3.5–5.1)
Sodium: 142 mmol/L (ref 135–145)
Total Bilirubin: 0.5 mg/dL (ref 0.3–1.2)
Total Protein: 6.9 g/dL (ref 6.5–8.1)

## 2018-01-02 LAB — APTT: aPTT: 29 seconds (ref 24–36)

## 2018-01-02 LAB — PROTIME-INR
INR: 0.91
Prothrombin Time: 12.2 seconds (ref 11.4–15.2)

## 2018-01-02 NOTE — Progress Notes (Signed)
07/21/2017- EKG on chart from [facility]

## 2018-01-06 NOTE — H&P (Deleted)
TOTAL KNEE ADMISSION H&P  Patient is being admitted for right total knee arthroplasty.  Subjective:  Chief Complaint:right knee pain.  HPI: Paul Brady, 62 y.o. male, has a history of pain and functional disability in the right knee due to arthritis and has failed non-surgical conservative treatments for greater than 12 weeks to includeNSAID's and/or analgesics, corticosteriod injections, flexibility and strengthening excercises and activity modification.  Onset of symptoms was gradual, starting 5 years ago with gradually worsening course since that time. The patient noted prior procedures on the knee to include  arthroscopy and menisectomy on the right knee(s).  Patient currently rates pain in the right knee(s) at 3 out of 10 with activity. Patient has night pain, worsening of pain with activity and weight bearing, pain that interferes with activities of daily living, pain with passive range of motion, crepitus and joint swelling.  Patient has evidence of periarticular osteophytes and joint space narrowing by imaging studies. There is no active infection.  Patient Active Problem List   Diagnosis Date Noted  . Perforation of sigmoid colon due to diverticulitis   . Benign prostatic hyperplasia with incomplete bladder emptying   . Diverticulitis of colon with perforation 02/07/2016  . Depression 02/07/2016  . Elevated lipase 02/07/2016  . Diverticulitis large intestine 02/07/2016  . Bilateral inguinal hernia (BIH) s/p lap repair 10/14/2014 10/14/2014  . Leg weakness, bilateral 10/14/2014  . Lytic bone lesions on xray 10/04/2014  . Occipital neuralgia of right side 06/28/2014  . Suicidal ideations 04/12/2014  . Homicidal ideations 04/12/2014  . Major depressive disorder, recurrent severe without psychotic features ([place]) 04/11/2014  . Chest pain at rest 08/26/2013  . Protein-calorie malnutrition, severe ([place]) 08/26/2013  . Cervical radicular pain 03/25/2013  . Neck pain 03/24/2013  . Chest  pain 08/04/2012  . HTN (hypertension) 08/04/2012  . Hyperlipidemia 08/04/2012  . Tobacco abuse 08/04/2012   Past Medical History:  Diagnosis Date  . Chest pain   . Depression   . Diverticulitis   . Hyperlipidemia   . Hypertension     Past Surgical History:  Procedure Laterality Date  . ANTERIOR CERVICAL DECOMP/DISCECTOMY FUSION N/A 03/24/2013   Procedure: ACDF C5 - C7 2 LEVELS;  Surgeon: Melina Schools, MD;  Location: [place];  Service: Orthopedics;  Laterality: N/A;  . CARPAL TUNNEL RELEASE     right   . EYE SURGERY     bilateral catawract surgery with lens implant  . HERNIA REPAIR  1980's  . INGUINAL HERNIA REPAIR Bilateral 10/14/2014   Procedure: LAPAROSCOPIC BILATERAL INGUINAL HERNIA REPAIRS WITH MESH;  Surgeon: Michael Boston, MD;  Location: [facility];  Service: General;  Laterality: Bilateral;  . WRIST FUSION Right 04/2012    Current Outpatient Medications  Medication Sig Dispense Refill Last Dose  . albuterol (PROVENTIL HFA;VENTOLIN HFA) 108 (90 Base) MCG/ACT inhaler Inhale 2 puffs into the lungs every 4 (four) hours as needed for wheezing or shortness of breath. 1 Inhaler 0 uknown  . ALPRAZolam (XANAX) 0.25 MG tablet Take 0.25 mg by mouth at bedtime as needed for anxiety.   11/11/2017 at Unknown time  . amLODipine (NORVASC) 10 MG tablet Take 10 mg by mouth daily.    11/11/2017 at Unknown time  . aspirin EC 81 MG tablet Take 81 mg by mouth daily.     [name] buPROPion (WELLBUTRIN SR) 150 MG 12 hr tablet Take 150 mg by mouth 2 (two) times daily.    Past Week at Unknown time  . cetirizine (ZYRTEC) 10 MG tablet  Take 10 mg by mouth at bedtime.     . fluticasone (FLONASE) 50 MCG/ACT nasal spray Place 1-2 sprays into both nostrils daily as needed for allergies.    11/11/2017 at Unknown time  . gabapentin (NEURONTIN) 600 MG tablet Take 600 mg by mouth 3 (three) times daily.    Past Week at Unknown time  . HYDROcodone-acetaminophen (NORCO) 10-325 MG tablet Take 1-2 tablets by mouth every 8 (eight) hours  as needed for moderate pain or severe pain.    Past Week at Unknown time  . lamoTRIgine (LAMICTAL) 100 MG tablet Take 100 mg by mouth daily.     [name] loratadine (CLARITIN) 10 MG tablet Take 10 mg by mouth daily.     . meloxicam (MOBIC) 15 MG tablet Take 15 mg by mouth at bedtime.     . Multiple Vitamins-Minerals (MULTIVITAMIN MEN 50+ PO) Take 1 tablet by mouth daily.     . Omega-3 Fatty Acids (FISH OIL) 1000 MG CAPS Take 2,000 mg by mouth daily.     [name] omeprazole (PRILOSEC) 40 MG capsule Take 40 mg by mouth daily.    Past Week at Unknown time  . Probiotic Product (PROBIOTIC PO) Take 1 capsule by mouth daily.     . QUEtiapine (SEROQUEL) 400 MG tablet Take 400 mg by mouth at bedtime.    11/11/2017 at Unknown time  . ranitidine (ZANTAC) 150 MG tablet Take 150 mg by mouth daily.    Past Week at Unknown time  . tamsulosin (FLOMAX) 0.4 MG CAPS capsule Take 0.4 mg by mouth daily.    11/11/2017 at Unknown time  . ondansetron (ZOFRAN ODT) 4 MG disintegrating tablet Take 1 tablet (4 mg total) by mouth every 8 (eight) hours as needed for nausea or vomiting. (Patient not taking: Reported on 12/31/2017) 9 tablet 0 Completed Course at Unknown time   Allergies  Allergen Reactions  . Mold Extract [Trichophyton] Other (See Comments)    Per allergy testing.   . Trazodone And Nefazodone Other (See Comments)    Reaction:  Unknown     Social History   Tobacco Use  . Smoking status: Current Every Day Smoker    Packs/day: 0.25    Years: 40.00    Pack years: 10.00    Types: Cigarettes  . Smokeless tobacco: Never Used  Substance Use Topics  . Alcohol use: Yes    Alcohol/week: 0.0 standard drinks    Comment: Occasionally    Family History  Problem Relation Age of Onset  . Hypertension Mother        Living 51  . Other Father        Work-related injury  . Mental illness Sister   . Mental illness Sister      Review of Systems  Constitutional: Negative.   HENT: Negative.   Eyes: Negative.   Cardiovascular:  Negative.   Gastrointestinal: Negative.   Genitourinary: Negative.   Musculoskeletal: Positive for joint pain and myalgias. Negative for back pain, falls and neck pain.  Skin: Negative.   Neurological: Negative.   Endo/Heme/Allergies: Negative.   Psychiatric/Behavioral: Negative.     Objective:  Physical Exam  Constitutional: He is oriented to person, place, and time. He appears well-developed and well-nourished. No distress.  HENT:  Head: Normocephalic and atraumatic.  Right Ear: External ear normal.  Left Ear: External ear normal.  Nose: Nose normal.  Mouth/Throat: Oropharynx is clear and moist.  Eyes: Conjunctivae and EOM are normal.  Neck: Normal range of motion. Neck  supple.  Cardiovascular: Normal rate, regular rhythm, normal heart sounds and intact distal pulses.  No murmur heard. Respiratory: Effort normal and breath sounds normal. No respiratory distress. He has no wheezes.  GI: Soft. Bowel sounds are normal. He exhibits no distension. There is no tenderness.  Musculoskeletal:       Right hip: Normal.       Left hip: Normal.  Incision over left knee healed with no signs of infection. Right knee tender along the medial joint line. Mild effusion. Moderate patellofemoral crepitus. ROM 5-125 degrees. Left knee 0-125 degrees.  Neurological: He is alert and oriented to person, place, and time. He has normal strength. No sensory deficit.  Skin: No rash noted. He is not diaphoretic. No erythema.  Psychiatric: He has a normal mood and affect. His behavior is normal.    Ht: 5 ft 11 in  Wt: 196 lbs  BP:     124/76 HR:    76 bpm BMI: 27.3  Pain Scale: 3   Imaging Review Plain radiographs demonstrate severe degenerative joint disease of the right knee(s). The overall alignment ismild varus. The bone quality appears to be good for age and reported activity level.   Preoperative templating of the joint replacement has been completed, documented, and submitted to the Operating  Room personnel in order to optimize intra-operative equipment management.   Anticipated LOS equal to or greater than 2 midnights due to - Age 71 and older with one or more of the following:  - Obesity  - Expected need for hospital services (PT, OT, Nursing) required for safe  discharge  - Anticipated need for postoperative skilled nursing care or inpatient rehab  - Active co-morbidities: None OR   - Unanticipated findings during/Post Surgery: None  - Patient is a high risk of re-admission due to: None     Assessment/Plan:  End stage primary osteoarthritis, right knee   The patient history, physical examination, clinical judgment of the provider and imaging studies are consistent with end stage degenerative joint disease of the right knee(s) and total knee arthroplasty is deemed medically necessary. The treatment options including medical management, injection therapy arthroscopy and arthroplasty were discussed at length. The risks and benefits of total knee arthroplasty were presented and reviewed. The risks due to aseptic loosening, infection, stiffness, patella tracking problems, thromboembolic complications and other imponderables were discussed. The patient acknowledged the explanation, agreed to proceed with the plan and consent was signed. Patient is being admitted for inpatient treatment for surgery, pain control, PT, OT, prophylactic antibiotics, VTE prophylaxis, progressive ambulation and ADL's and discharge planning. The patient is planning to be discharged home.   Therapy Plans: outpatient therapy at [facility] 11/4 Disposition: Home with wife DME needed: none PCP: Dr. Mervyn Skeeters Other: no anesthesia concerns   Ardeen Jourdain, PA-C

## 2018-01-06 NOTE — Progress Notes (Signed)
Called patient and instructed patient that there was a time change in his surgery and he needed to arrive at Admitting at [facility] at 1040 am. Follow all other instructions about taking medications the morning of surgery and nothing to eat or drink after midnight. Patient verbalized understanding.

## 2018-01-07 ENCOUNTER — Ambulatory Visit (HOSPITAL_COMMUNITY): Payer: Medicare Other | Admitting: Certified Registered Nurse Anesthetist

## 2018-01-07 ENCOUNTER — Encounter (HOSPITAL_COMMUNITY): Payer: Self-pay | Admitting: Certified Registered Nurse Anesthetist

## 2018-01-07 ENCOUNTER — Other Ambulatory Visit: Payer: Self-pay

## 2018-01-07 ENCOUNTER — Encounter (HOSPITAL_COMMUNITY)
Admission: RE | Disposition: A | Discharge status: Home / Self Care | Payer: Self-pay | Source: Ambulatory Visit | Attending: Orthopedic Surgery

## 2018-01-07 ENCOUNTER — Observation Stay (HOSPITAL_COMMUNITY)
Admission: RE | Admit: 2018-01-07 | Discharge: 2018-01-08 | Disposition: A | Discharge status: Home / Self Care | Payer: Medicare Other | Source: Ambulatory Visit | Attending: Orthopedic Surgery | Admitting: Orthopedic Surgery

## 2018-01-07 DIAGNOSIS — M79661 Pain in right lower leg: Secondary | ICD-10-CM | POA: Diagnosis present

## 2018-01-07 DIAGNOSIS — F329 Major depressive disorder, single episode, unspecified: Secondary | ICD-10-CM | POA: Insufficient documentation

## 2018-01-07 DIAGNOSIS — Z79899 Other long term (current) drug therapy: Secondary | ICD-10-CM | POA: Insufficient documentation

## 2018-01-07 DIAGNOSIS — F17210 Nicotine dependence, cigarettes, uncomplicated: Secondary | ICD-10-CM | POA: Diagnosis not present

## 2018-01-07 DIAGNOSIS — I10 Essential (primary) hypertension: Secondary | ICD-10-CM | POA: Diagnosis not present

## 2018-01-07 DIAGNOSIS — X58XXXA Exposure to other specified factors, initial encounter: Secondary | ICD-10-CM | POA: Insufficient documentation

## 2018-01-07 DIAGNOSIS — K219 Gastro-esophageal reflux disease without esophagitis: Secondary | ICD-10-CM | POA: Diagnosis not present

## 2018-01-07 DIAGNOSIS — Z791 Long term (current) use of non-steroidal anti-inflammatories (NSAID): Secondary | ICD-10-CM | POA: Insufficient documentation

## 2018-01-07 DIAGNOSIS — Z7982 Long term (current) use of aspirin: Secondary | ICD-10-CM | POA: Diagnosis not present

## 2018-01-07 DIAGNOSIS — S86011A Strain of right Achilles tendon, initial encounter: Principal | ICD-10-CM | POA: Diagnosis present

## 2018-01-07 HISTORY — PX: ACHILLES TENDON SURGERY: SHX542

## 2018-01-07 HISTORY — PX: CAST APPLICATION: SHX380

## 2018-01-07 LAB — CBC
HEMATOCRIT: 42.9 % (ref 39.0–52.0)
HEMOGLOBIN: 13.9 g/dL (ref 13.0–17.0)
MCH: 28.4 pg (ref 26.0–34.0)
MCHC: 32.4 g/dL (ref 30.0–36.0)
MCV: 87.7 fL (ref 80.0–100.0)
Platelets: 255 10*3/uL (ref 150–400)
RBC: 4.89 MIL/uL (ref 4.22–5.81)
RDW: 15 % (ref 11.5–15.5)
WBC: 9.5 10*3/uL (ref 4.0–10.5)
nRBC: 0 % (ref 0.0–0.2)

## 2018-01-07 LAB — CREATININE, SERUM
Creatinine, Ser: 0.87 mg/dL (ref 0.61–1.24)
GFR calc Af Amer: >60 mL/min (ref >60)

## 2018-01-07 SURGERY — REPAIR, TENDON, ACHILLES
Anesthesia: Regional | Site: Ankle | Laterality: Right

## 2018-01-07 MED ORDER — METOCLOPRAMIDE HCL 5 MG/ML IJ SOLN: 5.0000 mg | Freq: Three times a day (TID) | INTRAMUSCULAR | Status: DC | PRN

## 2018-01-07 MED ORDER — FENTANYL CITRATE (PF) 100 MCG/2ML IJ SOLN: 25.0000 ug | INTRAMUSCULAR | Status: DC | PRN

## 2018-01-07 MED ORDER — GABAPENTIN 300 MG PO CAPS
600.0000 mg | ORAL_CAPSULE | Freq: Three times a day (TID) | ORAL | Status: DC
  Administered 2018-01-07 – 2018-01-08 (×2): 600 mg via ORAL
  Filled 2018-01-07 (×2): qty 2

## 2018-01-07 MED ORDER — LAMOTRIGINE 100 MG PO TABS
100.0000 mg | ORAL_TABLET | Freq: Every day | ORAL | Status: DC
  Administered 2018-01-07 – 2018-01-08 (×2): 100 mg via ORAL
  Filled 2018-01-07 (×2): qty 1

## 2018-01-07 MED ORDER — BUPIVACAINE HCL (PF) 0.5 % IJ SOLN
INTRAMUSCULAR | Status: AC
  Filled 2018-01-07: qty 30

## 2018-01-07 MED ORDER — METOCLOPRAMIDE HCL 5 MG PO TABS: 5.0000 mg | ORAL_TABLET | Freq: Three times a day (TID) | ORAL | Status: DC | PRN

## 2018-01-07 MED ORDER — ONDANSETRON HCL 4 MG/2ML IJ SOLN: 4.0000 mg | Freq: Four times a day (QID) | INTRAMUSCULAR | Status: DC | PRN

## 2018-01-07 MED ORDER — LACTATED RINGERS IV SOLN
INTRAVENOUS | Status: DC
  Administered 2018-01-07: 21:00:00 via INTRAVENOUS

## 2018-01-07 MED ORDER — ONDANSETRON HCL 4 MG/2ML IJ SOLN
INTRAMUSCULAR | Status: DC | PRN
  Administered 2018-01-07: 4 mg via INTRAVENOUS

## 2018-01-07 MED ORDER — ALBUTEROL SULFATE HFA 108 (90 BASE) MCG/ACT IN AERS: 2.0000 | INHALATION_SPRAY | RESPIRATORY_TRACT | Status: DC | PRN

## 2018-01-07 MED ORDER — BISACODYL 5 MG PO TBEC: 5.0000 mg | DELAYED_RELEASE_TABLET | Freq: Every day | ORAL | Status: DC | PRN

## 2018-01-07 MED ORDER — ONDANSETRON HCL 4 MG PO TABS: 4.0000 mg | ORAL_TABLET | Freq: Four times a day (QID) | ORAL | Status: DC | PRN

## 2018-01-07 MED ORDER — FLEET ENEMA 7-19 GM/118ML RE ENEM: 1.0000 | ENEMA | Freq: Once | RECTAL | Status: DC | PRN

## 2018-01-07 MED ORDER — FENTANYL CITRATE (PF) 250 MCG/5ML IJ SOLN
INTRAMUSCULAR | Status: AC
  Filled 2018-01-07: qty 5

## 2018-01-07 MED ORDER — MORPHINE SULFATE (PF) 2 MG/ML IV SOLN: 0.5000 mg | INTRAVENOUS | Status: DC | PRN

## 2018-01-07 MED ORDER — ALPRAZOLAM 0.25 MG PO TABS: 0.2500 mg | ORAL_TABLET | Freq: Every evening | ORAL | Status: DC | PRN

## 2018-01-07 MED ORDER — ALBUTEROL SULFATE (2.5 MG/3ML) 0.083% IN NEBU: 2.5000 mg | INHALATION_SOLUTION | RESPIRATORY_TRACT | Status: DC | PRN

## 2018-01-07 MED ORDER — AMLODIPINE BESYLATE 10 MG PO TABS
10.0000 mg | ORAL_TABLET | Freq: Every day | ORAL | Status: DC
  Administered 2018-01-08: 10 mg via ORAL
  Filled 2018-01-07: qty 1

## 2018-01-07 MED ORDER — LIDOCAINE 2% (20 MG/ML) 5 ML SYRINGE
INTRAMUSCULAR | Status: DC | PRN
  Administered 2018-01-07: 60 mg via INTRAVENOUS

## 2018-01-07 MED ORDER — DEXAMETHASONE SODIUM PHOSPHATE 10 MG/ML IJ SOLN
INTRAMUSCULAR | Status: AC
  Filled 2018-01-07: qty 1

## 2018-01-07 MED ORDER — HYDROCODONE-ACETAMINOPHEN 5-325 MG PO TABS: 1.0000 | ORAL_TABLET | ORAL | Status: DC | PRN

## 2018-01-07 MED ORDER — ROCURONIUM BROMIDE 10 MG/ML (PF) SYRINGE
PREFILLED_SYRINGE | INTRAVENOUS | Status: AC
  Filled 2018-01-07: qty 10

## 2018-01-07 MED ORDER — CEFAZOLIN SODIUM-DEXTROSE 2-4 GM/100ML-% IV SOLN
2.0000 g | INTRAVENOUS | Status: AC
  Administered 2018-01-07: 2 g via INTRAVENOUS
  Filled 2018-01-07: qty 100

## 2018-01-07 MED ORDER — ENOXAPARIN SODIUM 40 MG/0.4ML ~~LOC~~ SOLN
40.0000 mg | SUBCUTANEOUS | Status: DC
  Administered 2018-01-08: 40 mg via SUBCUTANEOUS
  Filled 2018-01-07: qty 0.4

## 2018-01-07 MED ORDER — TAMSULOSIN HCL 0.4 MG PO CAPS
0.4000 mg | ORAL_CAPSULE | Freq: Every day | ORAL | Status: DC
  Administered 2018-01-07 – 2018-01-08 (×2): 0.4 mg via ORAL
  Filled 2018-01-07 (×2): qty 1

## 2018-01-07 MED ORDER — MIDAZOLAM HCL 2 MG/2ML IJ SOLN
INTRAMUSCULAR | Status: AC
  Filled 2018-01-07: qty 2

## 2018-01-07 MED ORDER — PANTOPRAZOLE SODIUM 40 MG PO TBEC
40.0000 mg | DELAYED_RELEASE_TABLET | Freq: Every day | ORAL | Status: DC
  Administered 2018-01-08: 40 mg via ORAL
  Filled 2018-01-07: qty 1

## 2018-01-07 MED ORDER — SUGAMMADEX SODIUM 200 MG/2ML IV SOLN
INTRAVENOUS | Status: DC | PRN
  Administered 2018-01-07: 350 mg via INTRAVENOUS

## 2018-01-07 MED ORDER — BACITRACIN-NEOMYCIN-POLYMYXIN 400-5-5000 EX OINT
TOPICAL_OINTMENT | CUTANEOUS | Status: AC
  Filled 2018-01-07: qty 1

## 2018-01-07 MED ORDER — PROPOFOL 10 MG/ML IV BOLUS
INTRAVENOUS | Status: DC | PRN
  Administered 2018-01-07: 200 mg via INTRAVENOUS

## 2018-01-07 MED ORDER — POLYETHYLENE GLYCOL 3350 17 G PO PACK: 17.0000 g | PACK | Freq: Every day | ORAL | Status: DC | PRN

## 2018-01-07 MED ORDER — LIDOCAINE 2% (20 MG/ML) 5 ML SYRINGE
INTRAMUSCULAR | Status: AC
  Filled 2018-01-07: qty 5

## 2018-01-07 MED ORDER — MIDAZOLAM HCL 2 MG/2ML IJ SOLN
1.0000 mg | INTRAMUSCULAR | Status: DC
  Administered 2018-01-07: 2 mg via INTRAVENOUS

## 2018-01-07 MED ORDER — FENTANYL CITRATE (PF) 100 MCG/2ML IJ SOLN
INTRAMUSCULAR | Status: AC
  Filled 2018-01-07: qty 2

## 2018-01-07 MED ORDER — ROCURONIUM BROMIDE 50 MG/5ML IV SOSY
PREFILLED_SYRINGE | INTRAVENOUS | Status: DC | PRN
  Administered 2018-01-07: 50 mg via INTRAVENOUS

## 2018-01-07 MED ORDER — CHLORHEXIDINE GLUCONATE 4 % EX LIQD: 60.0000 mL | Freq: Once | CUTANEOUS | Status: DC

## 2018-01-07 MED ORDER — QUETIAPINE FUMARATE 200 MG PO TABS
400.0000 mg | ORAL_TABLET | Freq: Every day | ORAL | Status: DC
  Administered 2018-01-07: 400 mg via ORAL
  Filled 2018-01-07: qty 8
  Filled 2018-01-07: qty 2

## 2018-01-07 MED ORDER — LACTATED RINGERS IV SOLN
INTRAVENOUS | Status: DC
  Administered 2018-01-07 (×2): via INTRAVENOUS

## 2018-01-07 MED ORDER — FLUTICASONE PROPIONATE 50 MCG/ACT NA SUSP
1.0000 | Freq: Every day | NASAL | Status: DC | PRN
  Filled 2018-01-07: qty 16

## 2018-01-07 MED ORDER — ACETAMINOPHEN 500 MG PO TABS
500.0000 mg | ORAL_TABLET | Freq: Four times a day (QID) | ORAL | Status: DC
  Administered 2018-01-07 (×2): 500 mg via ORAL
  Filled 2018-01-07 (×3): qty 1

## 2018-01-07 MED ORDER — ROPIVACAINE HCL 5 MG/ML IJ SOLN
INTRAMUSCULAR | Status: DC | PRN
  Administered 2018-01-07: 30 mL via PERINEURAL
  Administered 2018-01-07: 15 mL via PERINEURAL

## 2018-01-07 MED ORDER — FENTANYL CITRATE (PF) 100 MCG/2ML IJ SOLN
INTRAMUSCULAR | Status: DC | PRN
  Administered 2018-01-07 (×3): 50 ug via INTRAVENOUS

## 2018-01-07 MED ORDER — FENTANYL CITRATE (PF) 100 MCG/2ML IJ SOLN
50.0000 ug | INTRAMUSCULAR | Status: DC
  Administered 2018-01-07: 100 ug via INTRAVENOUS

## 2018-01-07 MED ORDER — DOCUSATE SODIUM 100 MG PO CAPS
100.0000 mg | ORAL_CAPSULE | Freq: Two times a day (BID) | ORAL | Status: DC
  Administered 2018-01-07 – 2018-01-08 (×2): 100 mg via ORAL
  Filled 2018-01-07 (×2): qty 1

## 2018-01-07 MED ORDER — 0.9 % SODIUM CHLORIDE (POUR BTL) OPTIME
TOPICAL | Status: DC | PRN
  Administered 2018-01-07: 1000 mL

## 2018-01-07 MED ORDER — ONDANSETRON HCL 4 MG/2ML IJ SOLN
INTRAMUSCULAR | Status: AC
  Filled 2018-01-07: qty 2

## 2018-01-07 MED ORDER — DEXAMETHASONE SODIUM PHOSPHATE 10 MG/ML IJ SOLN
INTRAMUSCULAR | Status: DC | PRN
  Administered 2018-01-07: 10 mg via INTRAVENOUS

## 2018-01-07 MED ORDER — BUPROPION HCL ER (SR) 150 MG PO TB12
150.0000 mg | ORAL_TABLET | Freq: Two times a day (BID) | ORAL | Status: DC
  Administered 2018-01-07 – 2018-01-08 (×2): 150 mg via ORAL
  Filled 2018-01-07 (×2): qty 1

## 2018-01-07 MED ORDER — SUGAMMADEX SODIUM 500 MG/5ML IV SOLN
INTRAVENOUS | Status: AC
  Filled 2018-01-07: qty 5

## 2018-01-07 MED ORDER — BACITRACIN-NEOMYCIN-POLYMYXIN 400-5-5000 EX OINT
TOPICAL_OINTMENT | CUTANEOUS | Status: DC | PRN
  Administered 2018-01-07: 1 via TOPICAL

## 2018-01-07 MED ORDER — ACETAMINOPHEN 325 MG PO TABS
325.0000 mg | ORAL_TABLET | Freq: Four times a day (QID) | ORAL | Status: DC | PRN
  Administered 2018-01-08: 500 mg via ORAL

## 2018-01-07 MED ORDER — HYDROCODONE-ACETAMINOPHEN 10-325 MG PO TABS: 1.0000 | ORAL_TABLET | Freq: Three times a day (TID) | ORAL | Status: DC | PRN

## 2018-01-07 MED ORDER — HYDROCODONE-ACETAMINOPHEN 7.5-325 MG PO TABS
1.0000 | ORAL_TABLET | ORAL | Status: DC | PRN
  Administered 2018-01-07: 1 via ORAL
  Administered 2018-01-08 (×2): 2 via ORAL
  Filled 2018-01-07: qty 1
  Filled 2018-01-07 (×2): qty 2

## 2018-01-07 MED ORDER — CEFAZOLIN SODIUM-DEXTROSE 1-4 GM/50ML-% IV SOLN
1.0000 g | Freq: Four times a day (QID) | INTRAVENOUS | Status: AC
  Administered 2018-01-07 – 2018-01-08 (×3): 1 g via INTRAVENOUS
  Filled 2018-01-07 (×3): qty 50

## 2018-01-07 SURGICAL SUPPLY — 48 items
BAG ZIPLOCK 12X15 (MISCELLANEOUS) ×3 IMPLANT
BANDAGE ESMARK 6X9 LF (GAUZE/BANDAGES/DRESSINGS) ×1 IMPLANT
BNDG ESMARK 6X9 LF (GAUZE/BANDAGES/DRESSINGS) ×3
COVER SURGICAL LIGHT HANDLE (MISCELLANEOUS) ×3 IMPLANT
COVER WAND RF STERILE (DRAPES) ×3 IMPLANT
CUFF TOURN SGL QUICK 34 (TOURNIQUET CUFF) ×2
CUFF TRNQT CYL 34X4X40X1 (TOURNIQUET CUFF) ×1 IMPLANT
DRAPE POUCH INSTRU U-SHP 10X18 (DRAPES) ×3 IMPLANT
DRSG ADAPTIC 3X8 NADH LF (GAUZE/BANDAGES/DRESSINGS) ×3 IMPLANT
DURAPREP 26ML APPLICATOR (WOUND CARE) ×6 IMPLANT
ELECT REM PT RETURN 15FT ADLT (MISCELLANEOUS) ×3 IMPLANT
GAUZE SPONGE 4X4 12PLY STRL (GAUZE/BANDAGES/DRESSINGS) ×3 IMPLANT
GLOVE BIOGEL M STRL SZ7.5 (GLOVE) ×3 IMPLANT
GLOVE BIOGEL PI IND STRL 6.5 (GLOVE) ×1 IMPLANT
GLOVE BIOGEL PI IND STRL 7.0 (GLOVE) ×2 IMPLANT
GLOVE BIOGEL PI IND STRL 7.5 (GLOVE) ×2 IMPLANT
GLOVE BIOGEL PI IND STRL 8.5 (GLOVE) ×1 IMPLANT
GLOVE BIOGEL PI INDICATOR 6.5 (GLOVE) ×2
GLOVE BIOGEL PI INDICATOR 7.0 (GLOVE) ×4
GLOVE BIOGEL PI INDICATOR 7.5 (GLOVE) ×4
GLOVE BIOGEL PI INDICATOR 8.5 (GLOVE) ×2
GLOVE ECLIPSE 8.0 STRL XLNG CF (GLOVE) ×3 IMPLANT
GLOVE SURG SS PI 6.5 STRL IVOR (GLOVE) ×3 IMPLANT
GOWN STRL REUS W/TWL LRG LVL3 (GOWN DISPOSABLE) ×6 IMPLANT
GOWN STRL REUS W/TWL XL LVL3 (GOWN DISPOSABLE) ×6 IMPLANT
MANIFOLD NEPTUNE II (INSTRUMENTS) ×3 IMPLANT
NEEDLE HYPO 22GX1.5 SAFETY (NEEDLE) ×3 IMPLANT
PACK ORTHO EXTREMITY (CUSTOM PROCEDURE TRAY) ×3 IMPLANT
PAD ABD 8X10 STRL (GAUZE/BANDAGES/DRESSINGS) ×3 IMPLANT
PAD CAST 4YDX4 CTTN HI CHSV (CAST SUPPLIES) ×2 IMPLANT
PADDING CAST COTTON 4X4 STRL (CAST SUPPLIES) ×4
PADDING CAST COTTON 6X4 STRL (CAST SUPPLIES) ×3 IMPLANT
PASSER SUT SWANSON 36MM LOOP (INSTRUMENTS) ×3 IMPLANT
POSITIONER SURGICAL ARM (MISCELLANEOUS) ×3 IMPLANT
SCOTCHCAST PLUS 4X4 WHITE (CAST SUPPLIES) ×6 IMPLANT
STAPLER VISISTAT (STAPLE) ×3 IMPLANT
SUCTION FRAZIER HANDLE 12FR (TUBING) ×2
SUCTION TUBE FRAZIER 12FR DISP (TUBING) ×1 IMPLANT
SUT ETHIBOND NAB CT1 #1 30IN (SUTURE) ×3 IMPLANT
SUT ETHILON 3 0 PS 1 (SUTURE) ×6 IMPLANT
SUT VIC AB 1 CT1 27 (SUTURE) ×2
SUT VIC AB 1 CT1 27XBRD ANTBC (SUTURE) ×1 IMPLANT
SUT VIC AB 2-0 CT1 27 (SUTURE) ×2
SUT VIC AB 2-0 CT1 TAPERPNT 27 (SUTURE) ×1 IMPLANT
SYR CONTROL 10ML LL (SYRINGE) ×3 IMPLANT
TOWEL OR 17X26 10 PK STRL BLUE (TOWEL DISPOSABLE) ×3 IMPLANT
TOWEL OR NON WOVEN STRL DISP B (DISPOSABLE) ×3 IMPLANT
YANKAUER SUCT BULB TIP 10FT TU (MISCELLANEOUS) ×3 IMPLANT

## 2018-01-07 NOTE — Op Note (Signed)
Paul Brady, Paul Brady MEDICAL RECORD [id number] ACCOUNT [id number] DATE OF BIRTH:04-21-1955 FACILITY: [facility] LOCATION: [facility] PHYSICIAN:Cheney Ewart Fransico Setters, MD  OPERATIVE REPORT  DATE OF PROCEDURE:  01/07/2018  SURGEON:  Kipp Brood. Gladstone Lighter, MD  ASSISTANT:  Ardeen Jourdain PA.  PREOPERATIVE DIAGNOSES:  Complete disruption of the right Achilles tendon.  POSTOPERATIVE DIAGNOSES:  Complete disruption of the right Achilles tendon.  OPERATION:   1.  Open repair of a disrupted right Achilles tendon. 2.  Application of a short leg cast with the foot in acute plantar flexion.  DESCRIPTION OF PROCEDURE:  Under general anesthesia, routine orthopedic prep and draping of the right lower extremity was carried out.  The patient was in the prone position.  The appropriate time-out was carried out.  I also marked the appropriate right  leg in the holding area.  The patient had 2 grams of IV Ancef.  After the prep and draping, the leg was exsanguinated with an Esmarch and tourniquet was elevated to 325 mmHg.  A posterior incision was carried out over the Achilles tendon on the right.   Great care was taken not to injure the neurovascular structures.  I then right down on the tendon defect.  We irrigated the area out.  We isolated the tear and we did a primary repair with #1 Ethibond suture.  After this was completed, we noted that the  tendon was in excellent position and we had a nice solid repair.  We then closed the skin and subcutaneous all in one layer with 3-0 nylon suture.  Sterile dressings were applied.    The patient's left foot then was casted with a short leg fiberglass cast with his foot in plantarflexion.  The patient left the operating room in satisfactory condition.  The surgeon, again Dr. Gladstone Lighter, assistant Ardeen Jourdain PA.    He will be kept overnight for pain control and released in the morning and he will be started on the appropriate anticoagulant.  AN/NUANCE  D:01/07/2018  T:01/07/2018 JOB:[id number]

## 2018-01-07 NOTE — Interval H&P Note (Signed)
History and Physical Interval Note:  01/07/2018 11:57 AM  Paul Brady  has presented today for surgery, with the diagnosis of right achilles tendon rupture  The various methods of treatment have been discussed with the patient and family. After consideration of risks, benefits and other options for treatment, the patient has consented to  Procedure(s) with comments: [place] (Right) - 30min as a surgical intervention .  The patient's history has been reviewed, patient examined, no change in status, stable for surgery.  I have reviewed the patient's chart and labs.  Questions were answered to the patient's satisfaction.     [name]

## 2018-01-07 NOTE — Evaluation (Signed)
Physical Therapy Evaluation Patient Details Name: Paul Brady MRN: [id number] DOB: Dec 21, 1955 Today's Date: 01/07/2018   History of Present Illness  62 YO male s/p R open achilles tendon repair on 01/07/18. Pt ruptured tendon while running 1 month ago. PMH includes depression, diverticulitis, HTN, HLD, cervical fusion C5-C7, R carpal tunnel release, eye surgery, inguinal hernia repair 10/2014, wrist fusion.     Clinical Impression  Pt presents with decreased knowledge of precautions, decreased safety awareness with mobility, and some difficulty performing mobility tasks. Pt to benefit from acute PT to address deficits. Pt ambulated 20 ft with RW, and adhered to precautions with frequent reinforcement. PT to progress mobility as tolerated, and will continue to follow acutely.      Follow Up Recommendations Follow surgeon's recommendation for DC plan and follow-up therapies    Equipment Recommendations  Rolling walker with 5" wheels    Recommendations for Other Services       Precautions / Restrictions Precautions Precautions: Fall Restrictions Weight Bearing Restrictions: Yes RLE Weight Bearing: Non weight bearing Other Position/Activity Restrictions: NWB with ambulation, touchdown WB with cast shoe when transferring       Mobility  Bed Mobility Overal bed mobility: Needs Assistance Bed Mobility: Supine to Sit     Supine to sit: Supervision;HOB elevated     General bed mobility comments: supervision for safety. Pt reminded of WB precautions when sitting EOB.   Transfers Overall transfer level: Needs assistance Equipment used: Rolling walker (2 wheeled) Transfers: Sit to/from Stand Sit to Stand: Min guard;From elevated surface         General transfer comment: Min guard for safety, verbal cuing for hand placement and NWB precautions. Pt followed precautions with transfer.   Ambulation/Gait Ambulation/Gait assistance: Min guard Gait Distance (Feet): 20  Feet Assistive device: Rolling walker (2 wheeled) Gait Pattern/deviations: (NWB RLE, knee flexed to avoid touching RLE on ground) Gait velocity: normal, pt encouraged to slow down for safety due to unsteadiness with turning and at times during ambulation    General Gait Details: Min guard for safety. Pt initially was holding RLE in front of him, pt advised to bend R knee and keep R lower leg behind him. WB precautions reinforced throughout ambulation. Pt encouraged to slow down, decreased safety awareness with ambulation and turning.   Stairs            Wheelchair Mobility    Modified Rankin (Stroke Patients Only)       Balance Overall balance assessment: Needs assistance Sitting-balance support: No upper extremity supported;Feet unsupported Sitting balance-Leahy Scale: Normal     Standing balance support: Bilateral upper extremity supported Standing balance-Leahy Scale: Poor                               Pertinent Vitals/Pain Pain Assessment: No/denies pain    Home Living Family/patient expects to be discharged to:: Private residence Living Arrangements: Alone Available Help at Discharge: Family;Friend(s);Available PRN/intermittently(Pt has home health nurse that comes in to help pt, 2 hours a day, 7 days a week) Type of Home: House Home Access: Stairs to enter Entrance Stairs-Rails: None Entrance Stairs-Number of Steps: In the front door, has 4 steps going down, then one step up. Going in the side door, 4 steps onto screened in porch, then one step to get into house.  Home Layout: Two level;Able to live on main level with bedroom/bathroom Home Equipment: Shower seat;Crutches      Prior  Function Level of Independence: Needs assistance   Gait / Transfers Assistance Needed: ambulating with crutches, pt states they are painful for him to use   ADL's / Homemaking Assistance Needed: Pt has aide come in 2 hours a day, 7 days a week to assist with cooking,  cleaning, errands         Hand Dominance   Dominant Hand: Right    Extremity/Trunk Assessment   Upper Extremity Assessment Upper Extremity Assessment: Overall WFL for tasks assessed    Lower Extremity Assessment Lower Extremity Assessment: Overall WFL for tasks assessed;RLE deficits/detail RLE Deficits / Details: RLE casted; able to move R toes RLE: Unable to fully assess due to immobilization RLE Sensation: WNL    Cervical / Trunk Assessment Cervical / Trunk Assessment: Normal  Communication   Communication: No difficulties  Cognition Arousal/Alertness: Awake/alert Behavior During Therapy: WFL for tasks assessed/performed Overall Cognitive Status: Within Functional Limits for tasks assessed                                        General Comments      Exercises     Assessment/Plan    PT Assessment Patient needs continued PT services  PT Problem List Decreased activity tolerance;Decreased knowledge of use of DME;Decreased balance;Decreased safety awareness;Decreased mobility;Decreased knowledge of precautions       PT Treatment Interventions DME instruction;Therapeutic activities;Gait training;Therapeutic exercise;Patient/family education;Stair training;Balance training;Functional mobility training    PT Goals (Current goals can be found in the Care Plan section)  Acute Rehab PT Goals PT Goal Formulation: With patient Time For Goal Achievement: 01/14/18 Potential to Achieve Goals: Good    Frequency Min 5X/week   Barriers to discharge        Co-evaluation               AM-PAC PT "6 Clicks" Daily Activity  Outcome Measure Difficulty turning over in bed (including adjusting bedclothes, sheets and blankets)?: Unable Difficulty moving from lying on back to sitting on the side of the bed? : Unable Difficulty sitting down on and standing up from a chair with arms (e.g., wheelchair, bedside commode, etc,.)?: Unable Help needed moving to  and from a bed to chair (including a wheelchair)?: A Little Help needed walking in hospital room?: A Little Help needed climbing 3-5 steps with a railing? : A Lot 6 Click Score: 11    End of Session Equipment Utilized During Treatment: Gait belt Activity Tolerance: Patient tolerated treatment well;No increased pain Patient left: in chair;with chair alarm set;with call bell/phone within reach;with SCD's reapplied Nurse Communication: Mobility status PT Visit Diagnosis: Other abnormalities of gait and mobility (R26.89);Unsteadiness on feet (R26.81)    Time: 1850-1920 PT Time Calculation (min) (ACUTE ONLY): 30 min   Charges:   PT Evaluation $PT Eval Low Complexity: 1 Low PT Treatments $Gait Training: 8-22 mins       Julien Girt, PT Acute Rehabilitation Services Pager [phone]  Office [phone]   Adelard Sanon D Elonda Husky 01/07/2018, 7:43 PM

## 2018-01-07 NOTE — Anesthesia Procedure Notes (Signed)
Anesthesia Regional Block: Adductor canal block   Pre-Anesthetic Checklist: ,, timeout performed, Correct Patient, Correct Site, Correct Laterality, Correct Procedure, Correct Position, site marked, Risks and benefits discussed,  Surgical consent,  Pre-op evaluation,  At surgeon's request and post-op pain management  Laterality: Right  Prep: Maximum Sterile Barrier Precautions used, chloraprep       Needles:  Injection technique: Single-shot  Needle Type: Echogenic Stimulator Needle     Needle Length: 9cm  Needle Gauge: 22     Additional Needles:   Procedures:,,,, ultrasound used (permanent image in chart),,,,  Narrative:  Start time: 01/07/2018 12:21 PM End time: 01/07/2018 12:31 PM Injection made incrementally with aspirations every 5 mL.  Performed by: Personally  Anesthesiologist: Freddrick March, MD  Additional Notes: Monitors applied. No increased pain on injection. No increased resistance to injection. Injection made in 5cc increments. Good needle visualization. Patient tolerated procedure well.

## 2018-01-07 NOTE — Anesthesia Procedure Notes (Signed)
Procedure Name: Intubation Date/Time: 01/07/2018 12:47 PM Performed by: Genelle Bal, CRNA Pre-anesthesia Checklist: Patient identified, Emergency Drugs available, Suction available and Patient being monitored Patient Re-evaluated:Patient Re-evaluated prior to induction Oxygen Delivery Method: Circle system utilized Preoxygenation: Pre-oxygenation with 100% oxygen Induction Type: IV induction Ventilation: Mask ventilation without difficulty Laryngoscope Size: Glidescope and 4 Grade View: Grade I Tube type: Oral Tube size: 7.5 mm Number of attempts: 2 Airway Equipment and Method: Stylet,  Oral airway,  Bougie stylet and Video-laryngoscopy Placement Confirmation: ETT inserted through vocal cords under direct vision,  positive ETCO2 and breath sounds checked- equal and bilateral Secured at: 23 cm Tube secured with: Tape Dental Injury: Teeth and Oropharynx as per pre-operative assessment  Difficulty Due To: Difficulty was unanticipated, Difficult Airway- due to large tongue and Difficult Airway- due to anterior larynx Future Recommendations: Recommend- induction with short-acting agent, and alternative techniques readily available Comments: DL x1 Mil 2, grade III view, unsuccessful attempt to pass Bougie, Glidescope #4 x1, grade I view

## 2018-01-07 NOTE — Anesthesia Preprocedure Evaluation (Addendum)
Anesthesia Evaluation  Patient identified by MRN, date of birth, ID band Patient awake    Reviewed: Allergy & Precautions, NPO status , Patient's Chart, lab work & pertinent test results  Airway Mallampati: I  TM Distance: >3 FB Neck ROM: Full    Dental no notable dental hx. (+) Teeth Intact, Dental Advisory Given   Pulmonary neg pulmonary ROS, Current Smoker,    Pulmonary exam normal breath sounds clear to auscultation       Cardiovascular hypertension, Pt. on medications negative cardio ROS Normal cardiovascular exam Rhythm:Regular Rate:Normal  TTE 2015 EF 55-60%, no valvular abnormalities   Neuro/Psych PSYCHIATRIC DISORDERS Depression negative neurological ROS     GI/Hepatic Neg liver ROS, GERD  Medicated,  Endo/Other  negative endocrine ROS  Renal/GU negative Renal ROS  negative genitourinary   Musculoskeletal negative musculoskeletal ROS (+)   Abdominal   Peds negative pediatric ROS (+)  Hematology negative hematology ROS (+)   Anesthesia Other Findings Right achilles tendon rupture  Reproductive/Obstetrics negative OB ROS                            Anesthesia Physical Anesthesia Plan  ASA: II  Anesthesia Plan: General and Regional   Post-op Pain Management:  Regional for Post-op pain   Induction: Intravenous  PONV Risk Score and Plan: 1 and Ondansetron, Dexamethasone and Midazolam  Airway Management Planned: Oral ETT  Additional Equipment:   Intra-op Plan:   Post-operative Plan: Extubation in OR  Informed Consent: I have reviewed the patients History and Physical, chart, labs and discussed the procedure including the risks, benefits and alternatives for the proposed anesthesia with the patient or authorized representative who has indicated his/her understanding and acceptance.   Dental advisory given  Plan Discussed with: CRNA  Anesthesia Plan Comments:        Anesthesia Quick Evaluation

## 2018-01-07 NOTE — Progress Notes (Signed)
Assisted Dr. Lanetta Inch with right knee popliteal block, 30 ml injected, and right adductor canal block with 15 ml injected.Side rails up, monitors on throughout procedure. See vital signs in flow sheet. Tolerated Procedure well.

## 2018-01-07 NOTE — Anesthesia Procedure Notes (Addendum)
Anesthesia Regional Block: Popliteal block   Pre-Anesthetic Checklist: ,, timeout performed, Correct Patient, Correct Site, Correct Laterality, Correct Procedure, Correct Position, site marked, Risks and benefits discussed,  Surgical consent,  Pre-op evaluation,  At surgeon's request and post-op pain management  Laterality: Right  Prep: Maximum Sterile Barrier Precautions used, chloraprep       Needles:  Injection technique: Single-shot  Needle Type: Echogenic Stimulator Needle     Needle Length: 9cm  Needle Gauge: 22     Additional Needles:   Procedures:,,,, ultrasound used (permanent image in chart),,,,  Narrative:  Start time: 01/07/2018 12:10 PM End time: 01/07/2018 12:20 PM Injection made incrementally with aspirations every 5 mL.  Performed by: Personally  Anesthesiologist: Freddrick March, MD  Additional Notes: Monitors applied. No increased pain on injection. No increased resistance to injection. Injection made in 5cc increments. Good needle visualization. Patient tolerated procedure well.

## 2018-01-07 NOTE — Transfer of Care (Signed)
Immediate Anesthesia Transfer of Care Note  Patient: Paul Brady  Procedure(s) Performed: OPEN ACHILLES TENDON REPAIR (Right Ankle) APPLICATION OF SHORT LEG CAST (Right Ankle)  Patient Location: PACU  Anesthesia Type:GA combined with regional for post-op pain  Level of Consciousness: awake, alert  and oriented  Airway & Oxygen Therapy: Patient Spontanous Breathing and Patient connected to face mask oxygen  Post-op Assessment: Report given to RN and Post -op Vital signs reviewed and stable  Post vital signs: Reviewed and stable  Last Vitals:  Vitals Value Taken Time  BP 138/79 01/07/2018  1:47 PM  Temp    Pulse 76   Resp 13 01/07/2018  1:48 PM  SpO2 100%   Vitals shown include unvalidated device data.  Last Pain:  Vitals:   01/07/18 1109  TempSrc:   PainSc: 7          Complications: No apparent anesthesia complications

## 2018-01-07 NOTE — Brief Op Note (Signed)
01/07/2018  1:39 PM  PATIENT:  Paul Brady  62 y.o. male  PRE-OPERATIVE DIAGNOSIS:  right achilles tendon rupture  POST-OPERATIVE DIAGNOSIS:  right achilles tendon rupture  PROCEDURE:  Procedure(s) with comments: OPEN ACHILLES TENDON REPAIR WITH APPLICATION OF SHORT LEG CAST (Right) - 76PPJ CAST APPLICATION (Right)  SURGEON:  Surgeon(s) and Role:    [name], MD - Primary  PHYSICIAN ASSISTANT: Ardeen Jourdain PA  ASSISTANTS: Ardeen Jourdain PA   ANESTHESIA:   general  EBL:  2 mL   BLOOD ADMINISTERED:none  DRAINS: none   LOCAL MEDICATIONS USED:  NONE  SPECIMEN:  No Specimen  DISPOSITION OF SPECIMEN:  N/A  COUNTS:  YES  TOURNIQUET:   Total Tourniquet Time Documented: Thigh (Right) - 26 minutes Total: Thigh (Right) - 26 minutes   DICTATION: .Other Dictation: Dictation Number [id number]  PLAN OF CARE: Admit for overnight observation  PATIENT DISPOSITION:  PACU - hemodynamically stable.   Delay start of Pharmacological VTE agent (>24hrs) due to surgical blood loss or risk of bleeding: yes

## 2018-01-07 NOTE — Anesthesia Postprocedure Evaluation (Signed)
Anesthesia Post Note  Patient: Paul Brady  Procedure(s) Performed: OPEN ACHILLES TENDON REPAIR (Right Ankle) APPLICATION OF SHORT LEG CAST (Right Ankle)     Patient location during evaluation: PACU Anesthesia Type: Regional and General Level of consciousness: awake and alert Pain management: pain level controlled Vital Signs Assessment: post-procedure vital signs reviewed and stable Respiratory status: spontaneous breathing, nonlabored ventilation, respiratory function stable and patient connected to nasal cannula oxygen Cardiovascular status: blood pressure returned to baseline and stable Postop Assessment: no apparent nausea or vomiting Anesthetic complications: no    Last Vitals:  Vitals:   01/07/18 1415 01/07/18 1430  BP: (!) 150/85 (!) 160/91  Pulse: 68 86  Resp: 15 13  Temp:    SpO2: 100% 100%    Last Pain:  Vitals:   01/07/18 1430  TempSrc:   PainSc: 0-No pain                 Seanmichael Salmons L Kamau Weatherall

## 2018-01-07 NOTE — H&P (Signed)
Paul Brady is an 62 y.o. male.   Chief Complaint: Pain and weakness of his right Calf. HPI: He injured his Right Achilles Tendon a few weeks ago.  Past Medical History:  Diagnosis Date  . Chest pain   . Depression   . Diverticulitis   . Hyperlipidemia   . Hypertension     Past Surgical History:  Procedure Laterality Date  . ANTERIOR CERVICAL DECOMP/DISCECTOMY FUSION N/A 03/24/2013   Procedure: ACDF C5 - C7 2 LEVELS;  Surgeon: Melina Schools, MD;  Location: [facility];  Service: Orthopedics;  Laterality: N/A;  . CARPAL TUNNEL RELEASE     right   . EYE SURGERY     bilateral catawract surgery with lens implant  . HERNIA REPAIR  1980's  . INGUINAL HERNIA REPAIR Bilateral 10/14/2014   Procedure: LAPAROSCOPIC BILATERAL INGUINAL HERNIA REPAIRS WITH MESH;  Surgeon: Michael Boston, MD;  Location: [facility];  Service: General;  Laterality: Bilateral;  . WRIST FUSION Right 04/2012    Family History  Problem Relation Age of Onset  . Hypertension Mother        Living 23  . Other Father        Work-related injury  . Mental illness Sister   . Mental illness Sister    Social History:  reports that he has been smoking cigarettes. He has a 10.00 pack-year smoking history. He has never used smokeless tobacco. He reports that he drinks alcohol. He reports that he does not use drugs.  Allergies:  Allergies  Allergen Reactions  . Mold Extract [Trichophyton] Other (See Comments)    Per allergy testing.   . Trazodone And Nefazodone Other (See Comments)    Reaction:  Unknown     Medications Prior to Admission  Medication Sig Dispense Refill  . albuterol (PROVENTIL HFA;VENTOLIN HFA) 108 (90 Base) MCG/ACT inhaler Inhale 2 puffs into the lungs every 4 (four) hours as needed for wheezing or shortness of breath. 1 Inhaler 0  . ALPRAZolam (XANAX) 0.25 MG tablet Take 0.25 mg by mouth at bedtime as needed for anxiety.    [name] amLODipine (NORVASC) 10 MG tablet Take 10 mg by mouth daily.     [name] aspirin EC 81 MG  tablet Take 81 mg by mouth daily.    [name] buPROPion (WELLBUTRIN SR) 150 MG 12 hr tablet Take 150 mg by mouth 2 (two) times daily.     . cetirizine (ZYRTEC) 10 MG tablet Take 10 mg by mouth at bedtime.    . fluticasone (FLONASE) 50 MCG/ACT nasal spray Place 1-2 sprays into both nostrils daily as needed for allergies.     [name] gabapentin (NEURONTIN) 600 MG tablet Take 600 mg by mouth 3 (three) times daily.     [name] HYDROcodone-acetaminophen (NORCO) 10-325 MG tablet Take 1-2 tablets by mouth every 8 (eight) hours as needed for moderate pain or severe pain.     [name] lamoTRIgine (LAMICTAL) 100 MG tablet Take 100 mg by mouth daily.    [name] loratadine (CLARITIN) 10 MG tablet Take 10 mg by mouth daily.    . meloxicam (MOBIC) 15 MG tablet Take 15 mg by mouth at bedtime.    . Multiple Vitamins-Minerals (MULTIVITAMIN MEN 50+ PO) Take 1 tablet by mouth daily.    . Omega-3 Fatty Acids (FISH OIL) 1000 MG CAPS Take 2,000 mg by mouth daily.    [name] omeprazole (PRILOSEC) 40 MG capsule Take 40 mg by mouth daily.     . Probiotic Product (PROBIOTIC PO) Take 1 capsule  by mouth daily.    . QUEtiapine (SEROQUEL) 400 MG tablet Take 400 mg by mouth at bedtime.     . ranitidine (ZANTAC) 150 MG tablet Take 150 mg by mouth daily.     . tamsulosin (FLOMAX) 0.4 MG CAPS capsule Take 0.4 mg by mouth daily.     . ondansetron (ZOFRAN ODT) 4 MG disintegrating tablet Take 1 tablet (4 mg total) by mouth every 8 (eight) hours as needed for nausea or vomiting. (Patient not taking: Reported on 12/31/2017) 9 tablet 0    No results found for this or any previous visit (from the past 48 hour(s)). No results found.  Review of Systems  Constitutional: Negative.   HENT: Negative.   Eyes: Negative.   Respiratory: Negative.   Cardiovascular: Negative.   Gastrointestinal: Negative.   Genitourinary: Negative.   Musculoskeletal: Positive for joint pain.  Skin: Negative.   Neurological: Negative.   Endo/Heme/Allergies: Negative.    Psychiatric/Behavioral: Negative.     Blood pressure 131/84, pulse 82, temperature 98.7 F (37.1 C), temperature source Oral, resp. rate 18, height 6\' 2"  (1.88 m), weight 91.6 kg, SpO2 100 %. Physical Exam  Constitutional: He appears well-developed.  HENT:  Head: Normocephalic.  Eyes: Pupils are equal, round, and reactive to light.  Neck: Normal range of motion.  Cardiovascular: Normal rate.  Respiratory: Effort normal.  GI: Soft.  Musculoskeletal:  Complete disruption of the Right Achilles Tendon.  Neurological: He is alert.  Skin: Skin is warm.  Psychiatric: He has a normal mood and affect.     Assessment/Plan OpenRepair and possible Graft of his Complete Torn Right Achilles Tendon.  [name], MD 01/07/2018, 11:50 AM

## 2018-01-08 ENCOUNTER — Encounter (HOSPITAL_COMMUNITY): Payer: Self-pay | Admitting: Orthopedic Surgery

## 2018-01-08 DIAGNOSIS — S86011A Strain of right Achilles tendon, initial encounter: Secondary | ICD-10-CM | POA: Diagnosis not present

## 2018-01-08 LAB — BASIC METABOLIC PANEL
Anion gap: 7 (ref 5–15)
BUN: 12 mg/dL (ref 6–20)
CHLORIDE: 109 mmol/L (ref 98–111)
CO2: 23 mmol/L (ref 22–32)
Calcium: 9 mg/dL (ref 8.9–10.3)
Creatinine, Ser: 0.82 mg/dL (ref 0.61–1.24)
GFR calc Af Amer: >60 mL/min (ref >60)
GFR calc non Af Amer: >60 mL/min (ref >60)
GLUCOSE: 129 mg/dL — AB (ref 70–99)
Potassium: 4 mmol/L (ref 3.5–5.1)
Sodium: 139 mmol/L (ref 135–145)

## 2018-01-08 MED ORDER — ASPIRIN EC 325 MG PO TBEC: 325.0000 mg | DELAYED_RELEASE_TABLET | Freq: Two times a day (BID) | ORAL | 0 refills | Status: DC

## 2018-01-08 MED ORDER — HYDROCODONE-ACETAMINOPHEN 7.5-325 MG PO TABS: 1.0000 | ORAL_TABLET | Freq: Four times a day (QID) | ORAL | 0 refills | Status: DC | PRN

## 2018-01-08 NOTE — Discharge Instructions (Signed)
Do not bear weight on the right foot except for touchdown weightbearing for transfers Keep cast dry No driving Take aspirin 325mg  twice a day to prevent blood clots Call if any temperatures greater than 101 or any wound complications: [phone] during the day and ask for Dr. Charlestine Night nurse, Brunilda Payor. Follow up in office in 2 weeks

## 2018-01-08 NOTE — Progress Notes (Signed)
Physical Therapy Treatment Patient Details Name: Paul Brady MRN: [id number] DOB: 1955-09-15 Today's Date: 01/08/2018    History of Present Illness 62 YO male s/p R open achilles tendon repair on 01/07/18. Pt ruptured tendon while running 1 month ago. PMH includes depression, diverticulitis, HTN, HLD, cervical fusion C5-C7, R carpal tunnel release, eye surgery, inguinal hernia repair 10/2014, wrist fusion.     PT Comments    Pt dressed and eager to go home.  Assisted with amb a functional distance and practiced stairs.  Pt has met goals to D/C to home.   Follow Up Recommendations  Follow surgeon's recommendation for DC plan and follow-up therapies     Equipment Recommendations  Rolling walker with 5" wheels    Recommendations for Other Services       Precautions / Restrictions Precautions Precautions: Fall Restrictions Weight Bearing Restrictions: Yes RLE Weight Bearing: Non weight bearing Other Position/Activity Restrictions: NWB with ambulation, touchdown WB with cast shoe when transferring     Mobility  Bed Mobility         Supine to sit: Supervision;HOB elevated     General bed mobility comments: OOB in recliner   Transfers Overall transfer level: Modified independent Equipment used: Rolling walker (2 wheeled) Transfers: Sit to/from Stand Sit to Stand: Modified independent (Device/Increase time)         General transfer comment: impulsive but this is his baseline      Ambulation/Gait Ambulation/Gait assistance: Modified independent (Device/Increase time);Supervision Gait Distance (Feet): 15 Feet Assistive device: Rolling walker (2 wheeled)   Gait velocity: impulsive    General Gait Details: performed NWB well and able to safely navigate around obsticles    Stairs Stairs: Yes Stairs assistance: Supervision Stair Management: Two rails;Forwards Number of Stairs: 3 General stair comments: performed well to maintain NWB   Wheelchair Mobility     Modified Rankin (Stroke Patients Only)       Balance                                            Cognition Arousal/Alertness: Awake/alert Behavior During Therapy: WFL for tasks assessed/performed Overall Cognitive Status: Within Functional Limits for tasks assessed                                        Exercises      General Comments        Pertinent Vitals/Pain Pain Assessment: Faces Pain Score: 8  Faces Pain Scale: Hurts a little bit Pain Location: R ankle Pain Descriptors / Indicators: Aching Pain Intervention(s): Monitored during session;Repositioned    Home Living Family/patient expects to be discharged to:: Private residence Living Arrangements: Alone           Home Equipment: Shower seat;Crutches      Prior Function    Gait / Transfers Assistance Needed: ambulating with crutches, pt states they are painful for him to use  ADL's / Homemaking Assistance Needed: Pt has aide come in 2 hours a day, 7 days a week to assist with cooking, cleaning, errands      PT Goals (current goals can now be found in the care plan section) Acute Rehab PT Goals Patient Stated Goal: get knee scooter and return to independence Progress towards PT goals: Progressing toward goals  Frequency    Min 5X/week      PT Plan Current plan remains appropriate    Co-evaluation              AM-PAC PT "6 Clicks" Daily Activity  Outcome Measure  Difficulty turning over in bed (including adjusting bedclothes, sheets and blankets)?: A Little Difficulty moving from lying on back to sitting on the side of the bed? : A Little Difficulty sitting down on and standing up from a chair with arms (e.g., wheelchair, bedside commode, etc,.)?: A Little Help needed moving to and from a bed to chair (including a wheelchair)?: A Little Help needed walking in hospital room?: A Little Help needed climbing 3-5 steps with a railing? : A Little 6 Click  Score: 18    End of Session Equipment Utilized During Treatment: Gait belt Activity Tolerance: Patient tolerated treatment well;No increased pain Patient left: in chair;with chair alarm set;with call bell/phone within reach;with SCD's reapplied Nurse Communication: (pt ready for D/C to home) PT Visit Diagnosis: Other abnormalities of gait and mobility (R26.89);Unsteadiness on feet (R26.81)     Time: 8295-6213 PT Time Calculation (min) (ACUTE ONLY): 13 min  Charges:  $Gait Training: 8-22 mins                     Rica Koyanagi  PTA Acute  Rehabilitation Services Pager      [phone] Office      [phone]

## 2018-01-08 NOTE — Progress Notes (Signed)
Subjective: 1 Day Post-Op Procedure(s) (LRB): OPEN ACHILLES TENDON REPAIR (Right) APPLICATION OF SHORT LEG CAST (Right) Patient reports pain as 1 on 0-10 scale. Doing fine .will DC   Objective: Vital signs in last 24 hours: Temp:  [97.5 F (36.4 C)-98.9 F (37.2 C)] 98.4 F (36.9 C) (10/31 0526) Pulse Rate:  [65-86] 71 (10/31 0526) Resp:  [10-19] 16 (10/31 0526) BP: (112-164)/(58-116) 118/75 (10/31 0526) SpO2:  [98 %-100 %] 100 % (10/31 0526) Weight:  [91.6 kg] 91.6 kg (10/30 1109)  Intake/Output from previous day: 10/30 0701 - 10/31 0700 In: 3571.5 [P.O.:480; I.V.:2941.5; IV Piggyback:150] Out: 2377 [Urine:2375; Blood:2] Intake/Output this shift: No intake/output data recorded.  Recent Labs    01/07/18 1607  HGB 13.9   Recent Labs    01/07/18 1607  WBC 9.5  RBC 4.89  HCT 42.9  PLT 255   Recent Labs    01/07/18 1607 01/08/18 0455  NA  --  139  K  --  4.0  CL  --  109  CO2  --  23  BUN  --  12  CREATININE 0.87 0.82  GLUCOSE  --  129*  CALCIUM  --  9.0   No results for input(s): LABPT, INR in the last 72 hours.  Neurovascular intact Sensation intact distally  Doing very well today.  Assessment/Plan: 1 Day Post-Op Procedure(s) (LRB): OPEN ACHILLES TENDON REPAIR (Right) APPLICATION OF SHORT LEG CAST (Right) Up with therapy.Will DC today.    [name] 01/08/2018, 7:17 AM

## 2018-01-08 NOTE — Care Management Obs Status (Signed)
[facility] NOTIFICATION   Patient Details  Name: Paul Brady MRN: [id number] Date of Birth: 01/04/1956   Medicare Observation Status Notification Given:  Yes    Guadalupe Maple, RN 01/08/2018, 10:55 AM

## 2018-01-08 NOTE — Care Management Note (Signed)
Case Management Note  Patient Details  Name: Quinnton Bury MRN: [id number] Date of Birth: May 17, 1955  Subjective/Objective:         Per OT patient has a script for knee scooter, contacted [facility] and they indicate patient will have to get it from [facility] store. Spoke with patient and he says his ride will be able to take him by at d/c.            Action/Plan: Provided patient with address to [facility] store. Patient also needs 3n1 per OT, contacted [facility] to deliver to the room.   Expected Discharge Date:  01/08/18               Expected Discharge Plan:  Home/Self Care  In-House Referral:  NA  Discharge planning Services  CM Consult  Post Acute Care Choice:  Durable Medical Equipment Choice offered to:  Patient  DME Arranged:  3-N-1 DME Agency:  [place]:  NA [place] Agency:  NA  Status of Service:  Completed, signed off  If discussed at [place] of Stay Meetings, dates discussed:    Additional Comments:  Guadalupe Maple, RN 01/08/2018, 10:41 AM

## 2018-01-08 NOTE — Evaluation (Signed)
Occupational Therapy Evaluation Patient Details Name: Twain Stenseth MRN: [id number] DOB: 11-27-1955 Today's Date: 01/08/2018    History of Present Illness 62 YO male s/p R open achilles tendon repair on 01/07/18. Pt ruptured tendon while running 1 month ago. PMH includes depression, diverticulitis, HTN, HLD, cervical fusion C5-C7, R carpal tunnel release, eye surgery, inguinal hernia repair 10/2014, wrist fusion.    Clinical Impression   Pt was admitted for the above. He will benefit from continued OT focusing on safe toilet transfers as he will be home most of the day on his own. He does have an aide 2 hours per day.    Follow Up Recommendations  No OT follow up    Equipment Recommendations  3 in 1 bedside commode    Recommendations for Other Services       Precautions / Restrictions Precautions Precautions: Fall Restrictions Weight Bearing Restrictions: Yes RLE Weight Bearing: Non weight bearing Other Position/Activity Restrictions: NWB with ambulation, touchdown WB with cast shoe when transferring       Mobility Bed Mobility         Supine to sit: Supervision;HOB elevated        Transfers   Equipment used: Rolling walker (2 wheeled)   Sit to Stand: Min guard;From elevated surface         General transfer comment: for safety;  slightly unsteady    Balance                                           ADL either performed or assessed with clinical judgement   ADL Overall ADL's : Needs assistance/impaired     Grooming: Set up   Upper Body Bathing: Set up   Lower Body Bathing: Min guard;Sit to/from stand   Upper Body Dressing : Set up   Lower Body Dressing: Min guard;Sit to/from stand   Toilet Transfer: Min guard   [place] and Hygiene: Min guard;Sit to/from stand         General ADL Comments: reviewed precautions. Pt is slightly unsteady with SPT to chair. Tends to move quickly. Issued reacher and long  sponge (for back).  Pt can reach to feet in long sitting but has some difficulty with right:  took several attempts to hook pants.  Encouraged him to have as much help as possible. Talked about meal prep; tying bag on walker or knee scooter and slowing down     Vision         Perception     Praxis      Pertinent Vitals/Pain Pain Assessment: 0-10 Pain Score: 8  Pain Location: R ankle Pain Descriptors / Indicators: Aching Pain Intervention(s): Limited activity within patient's tolerance;Monitored during session;Repositioned;Patient requesting pain meds-RN notified     Hand Dominance     Extremity/Trunk Assessment Upper Extremity Assessment Upper Extremity Assessment: Overall WFL for tasks assessed           Communication Communication Communication: No difficulties   Cognition Arousal/Alertness: Awake/alert Behavior During Therapy: WFL for tasks assessed/performed Overall Cognitive Status: Within Functional Limits for tasks assessed                                     General Comments       Exercises     Shoulder Instructions  Home Living Family/patient expects to be discharged to:: Private residence Living Arrangements: Alone                 Bathroom Shower/Tub: Tub/shower unit;Walk-in Psychologist, prison and probation services: Standard     Home Equipment: Research scientist (physical sciences) / Transfers Assistance Needed: ambulating with crutches, pt states they are painful for him to use  ADL's / Homemaking Assistance Needed: Pt has aide come in 2 hours a day, 7 days a week to assist with cooking, cleaning, errands             OT Problem List: Pain;Decreased knowledge of use of DME or AE;Decreased safety awareness      OT Treatment/Interventions: Self-care/ADL training;DME and/or AE instruction;Patient/family education;Balance training    OT Goals(Current goals can be found in the care plan section)  Acute Rehab OT Goals Patient Stated Goal: get knee scooter and return to independence OT Goal Formulation: With patient Time For Goal Achievement: 01/15/18 Potential to Achieve Goals: Good ADL Goals Pt Will Transfer to Toilet: with supervision;ambulating;bedside commode  OT Frequency: Min 2X/week   Barriers to D/C:            Co-evaluation              AM-PAC PT "6 Clicks" Daily Activity     Outcome Measure Help from another person eating meals?: None Help from another person taking care of personal grooming?: A Little Help from another person toileting, which includes using toliet, bedpan, or urinal?: A Little Help from another person bathing (including washing, rinsing, drying)?: A Little Help from another person to put on and taking off regular upper body clothing?: A Little Help from another person to put on and taking off regular lower body clothing?: A Little 6 Click Score: 19   End of Session    Activity Tolerance: Patient tolerated treatment well Patient left: in chair;with call bell/phone within reach;with chair alarm set  OT Visit Diagnosis: Unsteadiness on feet (R26.81)                Time: 7619-5093 OT Time Calculation (min): 23 min Charges:  OT General Charges $OT Visit: 1 Visit OT Evaluation $OT Eval Low Complexity: [place], OTR/L Acute Rehabilitation Services [phone] [facility] pager [phone] office 01/08/2018  [place] 01/08/2018, 10:50 AM

## 2018-01-08 NOTE — Plan of Care (Signed)
Patient discharged home in stable condition. Rx given, IV removed.

## 2018-01-13 NOTE — Discharge Summary (Signed)
Physician Discharge Summary   Patient ID: Paul Brady MRN: [id number] DOB/AGE: May 02, 1955 62 y.o.  Admit date: 01/07/2018 Discharge date: 01/13/2018  Primary Diagnosis:   Right achilles tendon rupture  Admission Diagnoses:  Past Medical History:  Diagnosis Date  . Chest pain   . Depression   . Diverticulitis   . Hyperlipidemia   . Hypertension    Discharge Diagnoses:   Active Problems:   Torn Achilles tendon, right, initial encounter  Procedure:  Procedure(s) (LRB): OPEN ACHILLES TENDON REPAIR (Right) APPLICATION OF SHORT LEG CAST (Right)   Consults: None  HPI: The patient is a 62 year old male who presented with the office with the chief complaint of right ankle pain. He reports that he injured the ankle a few weeks earlier and had immediate pain and difficulty weightbearing. MRI showed a torn right Achilles tendon.      Laboratory Data: Hospital Outpatient Visit on 01/02/2018  Component Date Value Ref Range Status  . aPTT 01/02/2018 29  24 - 36 seconds Final   Performed at [facility], [place] [address] [place], [place] [address]  . WBC 01/02/2018 9.6  4.0 - 10.5 K/uL Final  . RBC 01/02/2018 4.64  4.22 - 5.81 MIL/uL Final  . Hemoglobin 01/02/2018 13.3  13.0 - 17.0 g/dL Final  . HCT 01/02/2018 41.3  39.0 - 52.0 % Final  . MCV 01/02/2018 89.0  80.0 - 100.0 fL Final  . MCH 01/02/2018 28.7  26.0 - 34.0 pg Final  . MCHC 01/02/2018 32.2  30.0 - 36.0 g/dL Final  . RDW 01/02/2018 15.1  11.5 - 15.5 % Final  . Platelets 01/02/2018 264  150 - 400 K/uL Final  . nRBC 01/02/2018 0.0  0.0 - 0.2 % Final  . Neutrophils Relative % 01/02/2018 66  % Final  . Neutro Abs 01/02/2018 6.3  1.7 - 7.7 K/uL Final  . Lymphocytes Relative 01/02/2018 24  % Final  . Lymphs Abs 01/02/2018 2.3  0.7 - 4.0 K/uL Final  . Monocytes Relative 01/02/2018 8  % Final  . Monocytes Absolute 01/02/2018 0.8  0.1 - 1.0 K/uL Final  . Eosinophils Relative 01/02/2018 2  % Final  .  Eosinophils Absolute 01/02/2018 0.2  0.0 - 0.5 K/uL Final  . Basophils Relative 01/02/2018 0  % Final  . Basophils Absolute 01/02/2018 0.0  0.0 - 0.1 K/uL Final  . Immature Granulocytes 01/02/2018 0  % Final  . Abs Immature Granulocytes 01/02/2018 0.03  0.00 - 0.07 K/uL Final   Performed at [facility], [place] [address] [place], [place] [address]  . Sodium 01/02/2018 142  135 - 145 mmol/L Final  . Potassium 01/02/2018 4.6  3.5 - 5.1 mmol/L Final  . Chloride 01/02/2018 109  98 - 111 mmol/L Final  . CO2 01/02/2018 28  22 - 32 mmol/L Final  . Glucose, Bld 01/02/2018 98  70 - 99 mg/dL Final  . BUN 01/02/2018 12  6 - 20 mg/dL Final  . Creatinine, Ser 01/02/2018 0.93  0.61 - 1.24 mg/dL Final  . Calcium 01/02/2018 9.1  8.9 - 10.3 mg/dL Final  . Total Protein 01/02/2018 6.9  6.5 - 8.1 g/dL Final  . Albumin 01/02/2018 3.8  3.5 - 5.0 g/dL Final  . AST 01/02/2018 21  15 - 41 U/L Final  . ALT 01/02/2018 18  0 - 44 U/L Final  . Alkaline Phosphatase 01/02/2018 67  38 - 126 U/L Final  . Total Bilirubin 01/02/2018 0.5  0.3 -  1.2 mg/dL Final  . GFR calc non Af Amer 01/02/2018 >60  >60 mL/min Final  . GFR calc Af Amer 01/02/2018 >60  >60 mL/min Final   Comment: (NOTE) The eGFR has been calculated using the CKD EPI equation. This calculation has not been validated in all clinical situations. eGFR's persistently <60 mL/min signify possible Chronic Kidney Disease.   Georgiann Hahn gap 01/02/2018 5  5 - 15 Final   Performed at [facility], [place] [address] [place], [place] [address]  . Prothrombin Time 01/02/2018 12.2  11.4 - 15.2 seconds Final  . INR 01/02/2018 0.91   Final   Performed at [facility], [place] [address], [place], [place] [address]     Hospital Course: Patient was admitted to [facility] and taken to the OR and underwent the above state procedure without complications.  Patient tolerated the procedure well and was later  transferred to the recovery room and then to the orthopaedic floor for postoperative care.  They were given PO and IV analgesics for pain control following their surgery.  They were given 24 hours of postoperative antibiotics.   PT was consulted postop to assist with mobility and transfers. Discharge planning was consulted to help with postop disposition and equipment needs.  Patient had a fair night on the evening of surgery. Patient was seen in rounds and was ready to go home on day one. They were instructed on when to follow up in the office with Dr. Gladstone Lighter.   Diet: Cardiac diet Activity:TDWB for transfers only Follow-up:in 2 weeks Disposition - Home Discharged Condition: stable   Discharge Instructions    Call MD / Call 911   Complete by:  As directed    If you experience chest pain or shortness of breath, CALL 911 and be transported to the hospital emergency room.  If you develope a fever above 101 F, pus (white drainage) or increased drainage or redness at the wound, or calf pain, call your surgeon's office.   Constipation Prevention   Complete by:  As directed    Drink plenty of fluids.  Prune juice may be helpful.  You may use a stool softener, such as Colace (over the counter) 100 mg twice a day.  Use MiraLax (over the counter) for constipation as needed.   Diet - low sodium heart healthy   Complete by:  As directed    Discharge instructions   Complete by:  As directed    Do not bear weight on the right foot except for touchdown weightbearing for transfers Keep cast dry No driving Take aspirin 371m twice a day to prevent blood clots Call if any temperatures greater than 101 or any wound complications: [phone] the day and ask for Dr. GCharlestine Nightnurse, TBrunilda Payor Follow up in office in 2 weeks   Increase activity slowly as tolerated   Complete by:  As directed      Allergies as of 01/08/2018      Reactions   Mold Extract [trichophyton] Other (See Comments)   Per  allergy testing.    Trazodone And Nefazodone Other (See Comments)   Reaction:  Unknown       Medication List    STOP taking these medications   HYDROcodone-acetaminophen 10-325 MG tablet Commonly known as:  NORCO Replaced by:  HYDROcodone-acetaminophen 7.5-325 MG tablet   meloxicam 15 MG tablet Commonly known as:  MOBIC   ondansetron 4 MG disintegrating tablet Commonly known as:  ZOFRAN-ODT  TAKE these medications   albuterol 108 (90 Base) MCG/ACT inhaler Commonly known as:  PROVENTIL HFA;VENTOLIN HFA Inhale 2 puffs into the lungs every 4 (four) hours as needed for wheezing or shortness of breath.   ALPRAZolam 0.25 MG tablet Commonly known as:  XANAX Take 0.25 mg by mouth at bedtime as needed for anxiety.   amLODipine 10 MG tablet Commonly known as:  NORVASC Take 10 mg by mouth daily.   aspirin EC 325 MG tablet Take 1 tablet (325 mg total) by mouth 2 (two) times daily. What changed:    medication strength  how much to take  when to take this   buPROPion 150 MG 12 hr tablet Commonly known as:  WELLBUTRIN SR Take 150 mg by mouth 2 (two) times daily.   cetirizine 10 MG tablet Commonly known as:  ZYRTEC Take 10 mg by mouth at bedtime.   Fish Oil 1000 MG Caps Take 2,000 mg by mouth daily.   fluticasone 50 MCG/ACT nasal spray Commonly known as:  FLONASE Place 1-2 sprays into both nostrils daily as needed for allergies.   gabapentin 600 MG tablet Commonly known as:  NEURONTIN Take 600 mg by mouth 3 (three) times daily.   HYDROcodone-acetaminophen 7.5-325 MG tablet Commonly known as:  NORCO Take 1-2 tablets by mouth every 6 (six) hours as needed for severe pain (pain score 7-10). Replaces:  HYDROcodone-acetaminophen 10-325 MG tablet   lamoTRIgine 100 MG tablet Commonly known as:  LAMICTAL Take 100 mg by mouth daily.   loratadine 10 MG tablet Commonly known as:  CLARITIN Take 10 mg by mouth daily.   MULTIVITAMIN MEN 50+ PO Take 1 tablet by mouth  daily.   omeprazole 40 MG capsule Commonly known as:  PRILOSEC Take 40 mg by mouth daily.   PROBIOTIC PO Take 1 capsule by mouth daily.   QUEtiapine 400 MG tablet Commonly known as:  SEROQUEL Take 400 mg by mouth at bedtime.   ranitidine 150 MG tablet Commonly known as:  ZANTAC Take 150 mg by mouth daily.   tamsulosin 0.4 MG Caps capsule Commonly known as:  FLOMAX Take 0.4 mg by mouth daily.      Follow-up Information    [name], MD. Schedule an appointment as soon as possible for a visit in 2 week(s).   Specialty:  Orthopedic Surgery Contact information: [address] [place] [address] [place] [address] [phone]        [facility] - Dme Follow up.   Why:  take prescription to store on [address] to get knee scooter Contact information: [address] [place] [address] [phone]           Signed: Ardeen Jourdain, PA-C Orthopaedic Surgery 01/13/2018, 12:39 PM

## 2018-10-15 DIAGNOSIS — M25562 Pain in left knee: Secondary | ICD-10-CM | POA: Insufficient documentation

## 2019-01-07 ENCOUNTER — Encounter: Payer: Self-pay | Admitting: Neurology

## 2019-02-12 ENCOUNTER — Ambulatory Visit: Payer: Medicare Other | Admitting: Neurology

## 2019-06-01 DIAGNOSIS — E119 Type 2 diabetes mellitus without complications: Secondary | ICD-10-CM | POA: Insufficient documentation

## 2019-09-01 ENCOUNTER — Emergency Department (HOSPITAL_BASED_OUTPATIENT_CLINIC_OR_DEPARTMENT_OTHER)
Admission: EM | Admit: 2019-09-01 | Discharge: 2019-09-01 | Disposition: A | Discharge status: Home / Self Care | Payer: Medicare Other | Attending: Emergency Medicine | Admitting: Emergency Medicine

## 2019-09-01 ENCOUNTER — Other Ambulatory Visit: Payer: Self-pay

## 2019-09-01 ENCOUNTER — Encounter (HOSPITAL_BASED_OUTPATIENT_CLINIC_OR_DEPARTMENT_OTHER): Payer: Self-pay

## 2019-09-01 ENCOUNTER — Emergency Department (HOSPITAL_BASED_OUTPATIENT_CLINIC_OR_DEPARTMENT_OTHER): Payer: Medicare Other

## 2019-09-01 DIAGNOSIS — Y999 Unspecified external cause status: Secondary | ICD-10-CM | POA: Insufficient documentation

## 2019-09-01 DIAGNOSIS — M545 Low back pain, unspecified: Secondary | ICD-10-CM

## 2019-09-01 DIAGNOSIS — M25512 Pain in left shoulder: Secondary | ICD-10-CM | POA: Diagnosis not present

## 2019-09-01 DIAGNOSIS — W19XXXA Unspecified fall, initial encounter: Secondary | ICD-10-CM

## 2019-09-01 DIAGNOSIS — W101XXA Fall (on)(from) sidewalk curb, initial encounter: Secondary | ICD-10-CM | POA: Diagnosis not present

## 2019-09-01 DIAGNOSIS — S80211A Abrasion, right knee, initial encounter: Secondary | ICD-10-CM | POA: Diagnosis present

## 2019-09-01 DIAGNOSIS — Z7982 Long term (current) use of aspirin: Secondary | ICD-10-CM | POA: Diagnosis not present

## 2019-09-01 DIAGNOSIS — Y9355 Activity, bike riding: Secondary | ICD-10-CM | POA: Diagnosis not present

## 2019-09-01 DIAGNOSIS — Y92410 Unspecified street and highway as the place of occurrence of the external cause: Secondary | ICD-10-CM | POA: Insufficient documentation

## 2019-09-01 DIAGNOSIS — F17210 Nicotine dependence, cigarettes, uncomplicated: Secondary | ICD-10-CM | POA: Diagnosis not present

## 2019-09-01 DIAGNOSIS — I10 Essential (primary) hypertension: Secondary | ICD-10-CM | POA: Diagnosis not present

## 2019-09-01 MED ORDER — HYDROCODONE-ACETAMINOPHEN 5-325 MG PO TABS
1.0000 | ORAL_TABLET | Freq: Once | ORAL | Status: AC
  Administered 2019-09-01: 1 via ORAL
  Filled 2019-09-01: qty 1

## 2019-09-01 MED ORDER — CYCLOBENZAPRINE HCL 10 MG PO TABS: 10.0000 mg | ORAL_TABLET | Freq: Every day | ORAL | 0 refills | Status: DC
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
## 2019-09-01 NOTE — Discharge Instructions (Addendum)
Please take Tylenol as needed for management of your pain.  I am also prescribing you Flexeril.  You take this in the evening for management of your muscle pain and tightness.  This is a sedating medication so please do not mix with alcohol or operate a motor vehicle after taking it.  Please apply ice to the affected regions as needed.  Continue to move around and exercise as pain tolerates.  If you develop any new or worsening symptoms please return the emergency department.  Is a pleasure me.
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
## 2019-09-08 ENCOUNTER — Encounter (HOSPITAL_BASED_OUTPATIENT_CLINIC_OR_DEPARTMENT_OTHER): Payer: Self-pay

## 2019-09-08 ENCOUNTER — Other Ambulatory Visit: Payer: Self-pay

## 2019-09-08 ENCOUNTER — Emergency Department (HOSPITAL_BASED_OUTPATIENT_CLINIC_OR_DEPARTMENT_OTHER): Payer: Medicare Other

## 2019-09-08 ENCOUNTER — Emergency Department (HOSPITAL_BASED_OUTPATIENT_CLINIC_OR_DEPARTMENT_OTHER)
Admission: EM | Admit: 2019-09-08 | Discharge: 2019-09-08 | Disposition: A | Discharge status: Home / Self Care | Payer: Medicare Other | Attending: Emergency Medicine | Admitting: Emergency Medicine

## 2019-09-08 DIAGNOSIS — R1031 Right lower quadrant pain: Secondary | ICD-10-CM

## 2019-09-08 DIAGNOSIS — Z79899 Other long term (current) drug therapy: Secondary | ICD-10-CM | POA: Diagnosis not present

## 2019-09-08 DIAGNOSIS — I10 Essential (primary) hypertension: Secondary | ICD-10-CM | POA: Insufficient documentation

## 2019-09-08 DIAGNOSIS — F17210 Nicotine dependence, cigarettes, uncomplicated: Secondary | ICD-10-CM | POA: Insufficient documentation

## 2019-09-08 DIAGNOSIS — R1030 Lower abdominal pain, unspecified: Secondary | ICD-10-CM | POA: Diagnosis not present

## 2019-09-08 DIAGNOSIS — R519 Headache, unspecified: Secondary | ICD-10-CM | POA: Diagnosis not present

## 2019-09-08 DIAGNOSIS — M549 Dorsalgia, unspecified: Secondary | ICD-10-CM

## 2019-09-08 DIAGNOSIS — Z7982 Long term (current) use of aspirin: Secondary | ICD-10-CM | POA: Diagnosis not present

## 2019-09-08 DIAGNOSIS — W19XXXD Unspecified fall, subsequent encounter: Secondary | ICD-10-CM

## 2019-09-08 DIAGNOSIS — M545 Low back pain: Secondary | ICD-10-CM | POA: Insufficient documentation

## 2019-09-08 MED ORDER — PREDNISONE 20 MG PO TABS: 40.0000 mg | ORAL_TABLET | Freq: Every day | ORAL | 0 refills | Status: AC

## 2019-09-08 MED ORDER — DIAZEPAM 5 MG PO TABS
5.0000 mg | ORAL_TABLET | Freq: Once | ORAL | Status: AC
  Administered 2019-09-08: 5 mg via ORAL
  Filled 2019-09-08: qty 1

## 2019-09-08 NOTE — ED Notes (Signed)
Patient transported to CT 
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
## 2019-09-08 NOTE — Discharge Instructions (Addendum)
Your CT HEAD/Pelvis was negative on today's visit.   We discussed treatment with a short course of steroids, please take 2 tablets daily for the next 5 days.  You may also apply ice or heat to the area, you will need to stop lifting heavy items while covering from your previous back injury.
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
## 2019-09-08 NOTE — ED Notes (Signed)
Rt flank pain radiating to right back   Pain increased w deep breath

## 2020-11-28 ENCOUNTER — Other Ambulatory Visit: Payer: Self-pay

## 2020-11-28 ENCOUNTER — Ambulatory Visit (INDEPENDENT_AMBULATORY_CARE_PROVIDER_SITE_OTHER): Payer: Medicare Other | Admitting: Podiatry

## 2020-11-28 ENCOUNTER — Encounter: Payer: Self-pay | Admitting: Podiatry

## 2020-11-28 DIAGNOSIS — B353 Tinea pedis: Secondary | ICD-10-CM

## 2020-11-28 DIAGNOSIS — G5793 Unspecified mononeuropathy of bilateral lower limbs: Secondary | ICD-10-CM

## 2020-11-28 DIAGNOSIS — L309 Dermatitis, unspecified: Secondary | ICD-10-CM | POA: Diagnosis not present

## 2020-11-28 DIAGNOSIS — L603 Nail dystrophy: Secondary | ICD-10-CM | POA: Diagnosis not present

## 2020-11-28 MED ORDER — TERBINAFINE HCL 250 MG PO TABS: 250.0000 mg | ORAL_TABLET | Freq: Every day | ORAL | 0 refills | Status: DC
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
## 2020-12-28 ENCOUNTER — Ambulatory Visit: Payer: Medicare Other | Admitting: Podiatry

## 2022-08-27 ENCOUNTER — Ambulatory Visit: Payer: 59

## 2022-09-16 ENCOUNTER — Ambulatory Visit: Payer: 59 | Attending: Nurse Practitioner

## 2022-09-23 ENCOUNTER — Encounter: Payer: Self-pay | Admitting: *Deleted

## 2022-09-24 ENCOUNTER — Encounter: Payer: Self-pay | Admitting: Neurology

## 2022-09-24 ENCOUNTER — Telehealth: Payer: Self-pay | Admitting: Neurology

## 2022-09-24 ENCOUNTER — Ambulatory Visit: Payer: 59 | Admitting: Neurology

## 2022-09-24 NOTE — Telephone Encounter (Signed)
NS for new neuro consult today.

## 2022-09-24 NOTE — Telephone Encounter (Signed)
Noted sent to angie

## 2022-11-12 ENCOUNTER — Encounter: Payer: Self-pay | Admitting: *Deleted

## 2022-11-13 ENCOUNTER — Encounter: Payer: Self-pay | Admitting: Neurology

## 2022-11-13 ENCOUNTER — Ambulatory Visit (INDEPENDENT_AMBULATORY_CARE_PROVIDER_SITE_OTHER): Payer: 59 | Admitting: Neurology

## 2022-11-13 VITALS — BP 117/68 | HR 87 | Ht 74.0 in | Wt 224.0 lb

## 2022-11-13 DIAGNOSIS — Z8669 Personal history of other diseases of the nervous system and sense organs: Secondary | ICD-10-CM | POA: Diagnosis not present

## 2022-11-13 DIAGNOSIS — G4440 Drug-induced headache, not elsewhere classified, not intractable: Secondary | ICD-10-CM

## 2022-11-13 DIAGNOSIS — Z789 Other specified health status: Secondary | ICD-10-CM

## 2022-11-13 DIAGNOSIS — G4486 Cervicogenic headache: Secondary | ICD-10-CM

## 2022-11-13 DIAGNOSIS — R519 Headache, unspecified: Secondary | ICD-10-CM
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
## 2022-11-13 NOTE — Patient Instructions (Signed)
Please reduce your caffeine intake and limit yourself to 2 cups of coffee per day.  Please increase your water intake to aim for 64 ounces of water per day.  Please talk to your prescribing providers about reducing some of your medications that are affecting your balance including gabapentin, Flexeril, hydrocodone, and Xanax. Headaches can be perpetuated by taking daily anti-inflammatory medication such as Lodine.  I recommend that you talk to your prescriber about reducing the Lodine and avoiding high dose anti-inflammatory medication on a day-to-day basis.  Please follow-up with your orthopedic doctor, your ophthalmologist and your primary care as well as your sleep specialist.

## 2023-03-04 ENCOUNTER — Emergency Department (HOSPITAL_COMMUNITY)
Admission: EM | Admit: 2023-03-04 | Discharge: 2023-03-04 | Disposition: A | Discharge status: Home / Self Care | Payer: 59 | Attending: Emergency Medicine | Admitting: Emergency Medicine

## 2023-03-04 ENCOUNTER — Other Ambulatory Visit: Payer: Self-pay

## 2023-03-04 DIAGNOSIS — Z79899 Other long term (current) drug therapy: Secondary | ICD-10-CM | POA: Insufficient documentation

## 2023-03-04 DIAGNOSIS — M5442 Lumbago with sciatica, left side: Secondary | ICD-10-CM | POA: Insufficient documentation

## 2023-03-04 DIAGNOSIS — I10 Essential (primary) hypertension: Secondary | ICD-10-CM | POA: Diagnosis not present

## 2023-03-04 DIAGNOSIS — M545 Low back pain, unspecified: Secondary | ICD-10-CM | POA: Diagnosis present

## 2023-03-04 DIAGNOSIS — M5440 Lumbago with sciatica, unspecified side: Secondary | ICD-10-CM

## 2023-03-04 MED ORDER — METHYLPREDNISOLONE 4 MG PO TBPK: ORAL_TABLET | ORAL | 0 refills | Status: DC

## 2023-03-04 MED ORDER — CYCLOBENZAPRINE HCL 10 MG PO TABS
10.0000 mg | ORAL_TABLET | Freq: Once | ORAL | Status: AC
  Administered 2023-03-04: 10 mg via ORAL
  Filled 2023-03-04: qty 1

## 2023-03-04 MED ORDER — CYCLOBENZAPRINE HCL 10 MG PO TABS: 10.0000 mg | ORAL_TABLET | Freq: Two times a day (BID) | ORAL | 0 refills | Status: AC | PRN

## 2023-03-04 MED ORDER — KETOROLAC TROMETHAMINE 60 MG/2ML IM SOLN
60.0000 mg | Freq: Once | INTRAMUSCULAR | Status: AC
  Administered 2023-03-04: 60 mg via INTRAMUSCULAR
  Filled 2023-03-04: qty 2

## 2023-03-04 MED ORDER — DEXAMETHASONE 4 MG PO TABS
10.0000 mg | ORAL_TABLET | Freq: Once | ORAL | Status: AC
  Administered 2023-03-04: 10 mg via ORAL
  Filled 2023-03-04: qty 3

## 2023-03-04 MED ORDER — ACETAMINOPHEN 500 MG PO TABS
1000.0000 mg | ORAL_TABLET | Freq: Once | ORAL | Status: AC
  Administered 2023-03-04: 1000 mg via ORAL
  Filled 2023-03-04: qty 2

## 2023-03-04 NOTE — Discharge Instructions (Addendum)
Follow-up with primary care doctor.  Take next dose of methylprednisolone/steroid tomorrow.  Use Flexeril as prescribed for muscle spasms.  This medication is sedating so do not mix with alcohol drugs or dangerous activities including driving.  Recommend continued use of 1000 mg of Tylenol every 6 hours as needed for pain as well.

## 2023-03-04 NOTE — ED Provider Notes (Signed)
[place] EMERGENCY DEPARTMENT AT [facility] Provider Note   CSN: [id number] Arrival date & time: 03/04/23  5621     History  Chief Complaint  Patient presents with   Back Pain   Leg Pain    Paul Brady is a 67 y.o. male.  Patient here with low back pain for the last few days.  Some tingling down the left leg.  Denies any loss of bowel or bladder.  He has been sleeping in the hospital as his family member is currently in the hospital.  He denies any trauma.  Denies any loss of bowel or bladder.  Denies any weakness.  History of hypertension high cholesterol.  Pain with urination.  No hematuria.  No flank pain.  The history is provided by the patient.       Home Medications Prior to Admission medications   Medication Sig Start Date End Date Taking? Authorizing Provider  cyclobenzaprine (FLEXERIL) 10 MG tablet Take 1 tablet (10 mg total) by mouth 2 (two) times daily as needed for muscle spasms. 03/04/23  Yes Megan Presti, DO  methylPREDNISolone (MEDROL DOSEPAK) 4 MG TBPK tablet Follow package insert 03/04/23  Yes Kash Davie, DO  amLODipine-olmesartan (AZOR) 10-40 MG tablet Take 1 tablet by mouth daily.    [provider]  Ergocalciferol (VITAMIN D2 PO) Take 1,250 mcg by mouth once a week.    [provider]  gabapentin (NEURONTIN) 800 MG tablet Take 800-1,600 mg by mouth 2 (two) times daily. As directed for neuropathy    [provider]  metoprolol succinate (TOPROL-XL) 25 MG 24 hr tablet Take 25 mg by mouth daily.    [provider]  Multiple Vitamins-Minerals (MULTIVITAMIN MEN 50+ PO) Take 1 tablet by mouth daily.    [provider]  oxyCODONE-acetaminophen (PERCOCET) 10-325 MG tablet Take 1 tablet by mouth every 4 (four) hours as needed for pain.    [provider]  QUEtiapine (SEROQUEL) 400 MG tablet Take 400 mg by mouth at bedtime.  10/20/17   [provider]  sildenafil (VIAGRA) 50 MG tablet  Take 50 mg by mouth daily as needed for erectile dysfunction.    [provider]  tamsulosin (FLOMAX) 0.4 MG CAPS capsule Take 0.4 mg by mouth daily.  09/08/17   [provider]  zolpidem (AMBIEN) 10 MG tablet Take 10 mg by mouth at bedtime as needed for sleep.    [provider]      Allergies    Mold extract [trichophyton], Molds & smuts, Penicillamine, Penicillin g, Trazodone, and Trazodone and nefazodone    Review of Systems   Review of Systems  Physical Exam Updated Vital Signs BP (!) 177/103   Pulse 98   SpO2 100%  Physical Exam Vitals and nursing note reviewed.  Constitutional:      General: He is not in acute distress.    Appearance: He is well-developed. He is not ill-appearing.  HENT:     Head: Normocephalic and atraumatic.     Nose: Nose normal.     Mouth/Throat:     Mouth: Mucous membranes are moist.  Eyes:     Extraocular Movements: Extraocular movements intact.     Conjunctiva/sclera: Conjunctivae normal.     Pupils: Pupils are equal, round, and reactive to light.  Cardiovascular:     Rate and Rhythm: Normal rate and regular rhythm.     Pulses: Normal pulses.     Heart sounds: Normal heart sounds. No murmur heard. Pulmonary:  Effort: Pulmonary effort is normal. No respiratory distress.     Breath sounds: Normal breath sounds.  Abdominal:     General: Abdomen is flat.     Palpations: Abdomen is soft.     Tenderness: There is no abdominal tenderness.  Musculoskeletal:        General: Tenderness present. No swelling. Normal range of motion.     Cervical back: Normal range of motion and neck supple.     Comments: Tenderness to the paraspinal lumbar muscles on the left with increased tone no midline spinal tenderness  Skin:    General: Skin is warm and dry.     Capillary Refill: Capillary refill takes less than 2 seconds.  Neurological:     General: No focal deficit present.     Mental Status: He is alert and oriented to person,  place, and time.     Cranial Nerves: No cranial nerve deficit.     Sensory: No sensory deficit.     Motor: No weakness.     Coordination: Coordination normal.  Psychiatric:        Mood and Affect: Mood normal.     ED Results / Procedures / Treatments   Labs (all labs ordered are listed, but only abnormal results are displayed) Labs Reviewed - No data to display  EKG None  Radiology No results found.  Procedures Procedures    Medications Ordered in ED Medications  cyclobenzaprine (FLEXERIL) tablet 10 mg (has no administration in time range)  ketorolac (TORADOL) injection 60 mg (has no administration in time range)  acetaminophen (TYLENOL) tablet 1,000 mg (has no administration in time range)  dexamethasone (DECADRON) tablet 10 mg (has no administration in time range)    ED Course/ Medical Decision Making/ A&P                                 Medical Decision Making Risk OTC drugs. Prescription drug management.   Paul Brady is here with low back pain.  Normal vitals.  No fever.  History of hypertension high cholesterol.  Reproducible pain paraspinal muscles in the lumbar area.  No midline spinal pain.  No trauma.  Neurovascular neuromuscular intact.  Some sciatic type symptoms.  Differential diagnosis likely sciatica, radiculopathy.  Have no concern for cauda equina or cord compression or traumatic process.  Patient given Tylenol Decadron Flexeril Toradol and will prescribe Medrol Dosepak and Flexeril.  Recommend continued use of Tylenol at home.  Follow-up with primary care doctor.  Discharged in good condition.  This chart was dictated using voice recognition software.  Despite best efforts to proofread,  errors can occur which can change the documentation meaning.         Final Clinical Impression(s) / ED Diagnoses Final diagnoses:  Acute left-sided low back pain with sciatica, sciatica laterality unspecified    Rx / DC Orders ED Discharge Orders           Ordered    methylPREDNISolone (MEDROL DOSEPAK) 4 MG TBPK tablet        03/04/23 0824    cyclobenzaprine (FLEXERIL) 10 MG tablet  2 times daily PRN        03/04/23 0824              Virgina Norfolk, DO 03/04/23 0825
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
## 2023-03-25 ENCOUNTER — Other Ambulatory Visit: Payer: Self-pay

## 2023-03-25 ENCOUNTER — Emergency Department (HOSPITAL_COMMUNITY)
Admission: EM | Admit: 2023-03-25 | Discharge: 2023-03-26 | Discharge status: Against Medical Advice | Payer: 59 | Attending: Emergency Medicine | Admitting: Emergency Medicine

## 2023-03-25 ENCOUNTER — Encounter (HOSPITAL_COMMUNITY): Payer: Self-pay | Admitting: Emergency Medicine

## 2023-03-25 ENCOUNTER — Emergency Department (HOSPITAL_COMMUNITY): Payer: 59

## 2023-03-25 DIAGNOSIS — Z20822 Contact with and (suspected) exposure to covid-19: Secondary | ICD-10-CM | POA: Diagnosis not present

## 2023-03-25 DIAGNOSIS — R0981 Nasal congestion: Secondary | ICD-10-CM | POA: Insufficient documentation

## 2023-03-25 DIAGNOSIS — R059 Cough, unspecified: Secondary | ICD-10-CM | POA: Insufficient documentation

## 2023-03-25 DIAGNOSIS — Z5321 Procedure and treatment not carried out due to patient leaving prior to being seen by health care provider: Secondary | ICD-10-CM | POA: Diagnosis not present

## 2023-03-25 LAB — RESP PANEL BY RT-PCR (RSV, FLU A&B, COVID)  RVPGX2
Influenza A by PCR: NEGATIVE
Influenza B by PCR: NEGATIVE
Resp Syncytial Virus by PCR: NEGATIVE
SARS Coronavirus 2 by RT PCR: NEGATIVE

## 2023-03-25 MED ORDER — ALBUTEROL SULFATE HFA 108 (90 BASE) MCG/ACT IN AERS: 2.0000 | INHALATION_SPRAY | Freq: Once | RESPIRATORY_TRACT | Status: DC

## 2023-03-25 NOTE — ED Provider Triage Note (Signed)
 Emergency Medicine Provider Triage Evaluation Note  Yunus Stoklosa , a 68 y.o. male  was evaluated in triage.  Pt complains of cough, nasal congestion for the past 1.5 weeks.  Review of Systems  Positive: Cough, nasal congestion Negative: Chest pain  Physical Exam  BP (!) 150/94 (BP Location: Right Arm)   Pulse 91   Temp 98.2 F (36.8 C)   Resp 18   Wt 105 kg   SpO2 97%   BMI 29.72 kg/m  Gen:   Awake, no distress   Resp:  Normal effort, lungs sound clear MSK:   Moves extremities without difficulty   Medical Decision Making  Medically screening exam initiated at 5:48 AM.  Appropriate orders placed.  Edel Rivero was informed that the remainder of the evaluation will be completed by another provider, this initial triage assessment does not replace that evaluation, and the importance of remaining in the ED until their evaluation is complete.  CXR, COVID/Flu testing ordered. Albuterol  MDI ordered. Pt stable for the lobby.   Jerrol Agent, MD 03/25/23 [phone]

## 2023-03-25 NOTE — ED Triage Notes (Signed)
 Patient BIB [facility] c/o cold symptoms x 1.5 weeks.

## 2023-03-28 ENCOUNTER — Encounter (HOSPITAL_COMMUNITY): Payer: Self-pay

## 2023-03-28 ENCOUNTER — Ambulatory Visit (HOSPITAL_COMMUNITY)
Admission: EM | Admit: 2023-03-28 | Discharge: 2023-03-28 | Disposition: A | Discharge status: Home / Self Care | Payer: 59 | Attending: Family Medicine | Admitting: Family Medicine

## 2023-03-28 DIAGNOSIS — J189 Pneumonia, unspecified organism: Secondary | ICD-10-CM

## 2023-03-28 DIAGNOSIS — J4521 Mild intermittent asthma with (acute) exacerbation: Secondary | ICD-10-CM | POA: Diagnosis not present

## 2023-03-28 MED ORDER — LEVOFLOXACIN 500 MG PO TABS: 500.0000 mg | ORAL_TABLET | Freq: Every day | ORAL | 0 refills | Status: AC

## 2023-03-28 MED ORDER — PREDNISONE 20 MG PO TABS: 40.0000 mg | ORAL_TABLET | Freq: Every day | ORAL | 0 refills | Status: AC

## 2023-03-28 MED ORDER — ALBUTEROL SULFATE HFA 108 (90 BASE) MCG/ACT IN AERS: 2.0000 | INHALATION_SPRAY | RESPIRATORY_TRACT | 0 refills | Status: AC | PRN

## 2023-03-28 NOTE — ED Triage Notes (Signed)
Sore throat, chills, productive cough x 2 weeks. Patient states he went to the ER this mast Monday but left due to wait time. No known sick exposure. States his voice fades as well as feeling like his throat is swelling closed. There is blood and mucus when blowing his nose.   Patient tried Theraflu, Cough drops with no relief.

## 2023-03-28 NOTE — ED Triage Notes (Signed)
Blood pressure checked in both arms. Patient takes his BP meds at night and has not had them today yet. Having some lightheadedness and headache. No vision changes, nausea, or confusion.

## 2023-03-28 NOTE — Discharge Instructions (Addendum)
Your chest x-ray done on January 14 shows possible developing pneumonia.  The radiologist felt that you should have a follow-up chest x-ray to make sure this all improves with treatment.  Levaquin 500 mg--take 1 tablet daily for 7 days  Albuterol inhaler--do 2 puffs every 4 hours as needed for shortness of breath or wheezing  Take prednisone 20 mg--2 daily for 5 days   Keep your follow-up appointment with your primary care as scheduled  Mucinex/guaifenesin plain is good to take for the congestion and would not raise your blood pressure.  Do NOT take products with phenylephrine or pseudoephedrine in it

## 2023-03-28 NOTE — ED Provider Notes (Signed)
[facility]    CSN: [id number] Arrival date & time: 03/28/23  1504      History   Chief Complaint Chief Complaint  Patient presents with   Sore Throat    HPI Paul Brady is a 68 y.o. male.    Sore Throat  Here for sore throat and cough and congestion.  He has been breaking up thick yellow and white mucus.  Symptoms began about 2 weeks ago.  No fever at any point  He has had some hoarseness that is coming and going.  When he walks he gets short of breath.    No vomiting or diarrhea.  He has had some wheezing as had to use an inhaler in the past  He was seen at the emergency room on January 13 and 14.  He waited 20 hours and left without being seen.  Past Medical History:  Diagnosis Date   Achilles rupture, right, subsequent encounter    Anxiety    Aortic atherosclerosis (HCC)    Cannabis dependence, uncomplicated (HCC)    Chest pain    COPD (chronic obstructive pulmonary disease) (HCC)    Depression    Diverticulitis    GERD (gastroesophageal reflux disease)    Hypercholesterolemia    Hyperlipidemia    Hypertension    Lumbosacral pain, chronic    gabapentin, cyclobenzaprine   Prediabetes    Radiculopathy of leg    gabapentin   Right carpal tunnel syndrome    Sleep apnea in adult    Vitamin D deficiency     Patient Active Problem List   Diagnosis Date Noted   Diabetes mellitus (HCC) 06/01/2019   Pain in left knee 10/15/2018   Torn Achilles tendon, right, initial encounter 01/07/2018   Perforation of sigmoid colon due to diverticulitis    Benign prostatic hyperplasia with incomplete bladder emptying    Diverticulitis of colon with perforation 02/07/2016   Depression 02/07/2016   Elevated lipase 02/07/2016   Diverticulitis large intestine 02/07/2016   Bilateral inguinal hernia (BIH) s/p lap repair 10/14/2014 10/14/2014   Leg weakness, bilateral 10/14/2014   Lytic bone lesions on xray 10/04/2014   Occipital neuralgia of right side  06/28/2014   Suicidal ideations 04/12/2014   Homicidal ideations 04/12/2014   Major depressive disorder, 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/11/2014   Chest pain at rest 08/26/2013   Protein-calorie malnutrition, severe (HCC) 08/26/2013   Cervical radicular pain 03/25/2013   Neck pain 03/24/2013   Chest pain 08/04/2012   HTN (hypertension) 08/04/2012   Hyperlipidemia 08/04/2012   Tobacco abuse 08/04/2012    Past Surgical History:  Procedure Laterality Date   ACHILLES TENDON SURGERY Right 01/07/2018   Procedure: OPEN ACHILLES TENDON REPAIR;  Surgeon: Ranee Gosselin, MD;  Location: [facility];  Service: Orthopedics;  Laterality: Right;  Popliteal Block   ANTERIOR CERVICAL DECOMP/DISCECTOMY FUSION N/A 03/24/2013   Procedure: ACDF C5 - C7 2 LEVELS;  Surgeon: Venita Lick, MD;  Location: [facility];  Service: Orthopedics;  Laterality: N/A;   CARPAL TUNNEL RELEASE     right    CAST APPLICATION Right 01/07/2018   Procedure: APPLICATION OF SHORT LEG CAST;  Surgeon: Ranee Gosselin, MD;  Location: [facility];  Service: Orthopedics;  Laterality: Right;  Popliteal Block   EYE SURGERY     bilateral catawract surgery with lens implant   HERNIA REPAIR  1980's   INGUINAL HERNIA REPAIR Bilateral 10/14/2014   Procedure: LAPAROSCOPIC BILATERAL INGUINAL HERNIA REPAIRS WITH MESH;  Surgeon: Karie Soda,  MD;  Location: [facility];  Service: General;  Laterality: Bilateral;   WRIST FUSION Right 04/2012       Home Medications    Prior to Admission medications   Medication Sig Start Date End Date Taking? Authorizing Provider  albuterol (VENTOLIN HFA) 108 (90 Base) MCG/ACT inhaler Inhale 2 puffs into the lungs every 4 (four) hours as needed for wheezing or shortness of breath. 03/28/23  Yes Zenia Resides, MD  amLODipine-olmesartan (AZOR) 10-40 MG tablet Take 1 tablet by mouth daily.   Yes [provider]  cyclobenzaprine (FLEXERIL) 10 MG tablet Take 1 tablet (10 mg total) by mouth 2 (two)  times daily as needed for muscle spasms. 03/04/23  Yes Curatolo, Adam, DO  Ergocalciferol (VITAMIN D2 PO) Take 1,250 mcg by mouth once a week.   Yes [provider]  gabapentin (NEURONTIN) 800 MG tablet Take 800-1,600 mg by mouth 2 (two) times daily. As directed for neuropathy   Yes [provider]  levofloxacin (LEVAQUIN) 500 MG tablet Take 1 tablet (500 mg total) by mouth daily for 7 days. 03/28/23 04/04/23 Yes Zenia Resides, MD  metoprolol succinate (TOPROL-XL) 25 MG 24 hr tablet Take 25 mg by mouth daily.   Yes [provider]  Multiple Vitamins-Minerals (MULTIVITAMIN MEN 50+ PO) Take 1 tablet by mouth daily.   Yes [provider]  oxyCODONE-acetaminophen (PERCOCET) 10-325 MG tablet Take 1 tablet by mouth every 4 (four) hours as needed for pain.   Yes [provider]  predniSONE (DELTASONE) 20 MG tablet Take 2 tablets (40 mg total) by mouth daily with breakfast for 5 days. 03/28/23 04/02/23 Yes Zenia Resides, MD  QUEtiapine (SEROQUEL) 400 MG tablet Take 400 mg by mouth at bedtime.  10/20/17  Yes [provider]  sildenafil (VIAGRA) 50 MG tablet Take 50 mg by mouth daily as needed for erectile dysfunction.   Yes [provider]  tamsulosin (FLOMAX) 0.4 MG CAPS capsule Take 0.4 mg by mouth daily.  09/08/17  Yes [provider]  zolpidem (AMBIEN) 10 MG tablet Take 10 mg by mouth at bedtime as needed for sleep.   Yes [provider]    Family History Family History  Problem Relation Age of Onset   Hypertension Mother        Living 47   Other Father        Work-related injury   Mental illness Sister    Mental illness Sister     Social History Social History   Tobacco Use   Smoking status: Every Day    Current packs/day: 0.25    Average packs/day: 0.3 packs/day for 40.0 years (10.0 ttl pk-yrs)    Types: Cigarettes   Smokeless tobacco: Never  Vaping Use   Vaping status: Never Used  Substance Use  Topics   Alcohol use: Yes    Comment: Occasionally   Drug use: No     Allergies   Mold extract [trichophyton], Molds & smuts, Penicillamine, Penicillin g, Trazodone, and Trazodone and nefazodone   Review of Systems Review of Systems   Physical Exam Triage Vital Signs ED Triage Vitals  Encounter Vitals Group     BP 03/28/23 1622 (!) 181/101     Systolic BP Percentile --      Diastolic BP Percentile --      Pulse Rate 03/28/23 1622 79     Resp 03/28/23 1622 18     Temp 03/28/23 1622 98.3 F (36.8 C)  Temp Source 03/28/23 1622 Oral     SpO2 03/28/23 1622 98 %     Weight 03/28/23 1622 220 lb (99.8 kg)     Height 03/28/23 1622 6\' 2"  (1.88 m)     Head Circumference --      Peak Flow --      Pain Score 03/28/23 1619 7     Pain Loc --      Pain Education --      Exclude from Growth Chart --    No data found.  Updated Vital Signs BP (!) 186/102 (BP Location: Right Arm)   Pulse 77   Temp 98.3 F (36.8 C) (Oral)   Resp 18   Ht 6\' 2"  (1.88 m)   Wt 99.8 kg   SpO2 98%   BMI 28.25 kg/m   Visual Acuity Right Eye Distance:   Left Eye Distance:   Bilateral Distance:    Right Eye Near:   Left Eye Near:    Bilateral Near:     Physical Exam Vitals reviewed.  Constitutional:      General: He is not in acute distress.    Appearance: He is not toxic-appearing.  HENT:     Right Ear: Tympanic membrane and ear canal normal.     Left Ear: Tympanic membrane and ear canal normal.     Nose: Congestion present.     Mouth/Throat:     Mouth: Mucous membranes are moist.     Comments: There is mild erythema and yellow mucus in the posterior oropharynx Eyes:     Extraocular Movements: Extraocular movements intact.     Conjunctiva/sclera: Conjunctivae normal.     Pupils: Pupils are equal, round, and reactive to light.  Cardiovascular:     Rate and Rhythm: Normal rate and regular rhythm.     Heart sounds: No murmur heard. Pulmonary:     Effort: No respiratory distress.      Breath sounds: No stridor. No wheezing, rhonchi or rales.  Musculoskeletal:     Cervical back: Neck supple.  Lymphadenopathy:     Cervical: No cervical adenopathy.  Skin:    Capillary Refill: Capillary refill takes less than 2 seconds.     Coloration: Skin is not jaundiced or pale.  Neurological:     General: No focal deficit present.     Mental Status: He is alert and oriented to person, place, and time.  Psychiatric:        Behavior: Behavior normal.      UC Treatments / Results  Labs (all labs ordered are listed, but only abnormal results are displayed) Labs Reviewed - No data to display  EKG   Radiology No results found.  Procedures Procedures (including critical care time)  Medications Ordered in UC Medications - No data to display  Initial Impression / Assessment and Plan / UC Course  I have reviewed the triage vital signs and the nursing notes.  Pertinent labs & imaging results that were available during my care of the patient were reviewed by me and considered in my medical decision making (see chart for details).     I reviewed results with him from the ER visit: His flu/RSV/COVID test was negative. His chest x-ray showed opacities most consistent with developing pneumonia in the lower lung fields.  They recommended repeat chest x-ray after treatment.  Levaquin is sent in to treat pneumonia and albuterol and prednisone are sent in to treat possible asthma exacerbation.  He will follow-up with his primary  care and is set up to see them at the end of January.    Final Clinical Impressions(s) / UC Diagnoses   Final diagnoses:  Community acquired pneumonia, unspecified laterality  Mild intermittent asthma with acute exacerbation     Discharge Instructions      Your chest x-ray done on January 14 shows possible developing pneumonia.  The radiologist felt that you should have a follow-up chest x-ray to make sure this all improves with  treatment.  Levaquin 500 mg--take 1 tablet daily for 7 days  Albuterol inhaler--do 2 puffs every 4 hours as needed for shortness of breath or wheezing  Take prednisone 20 mg--2 daily for 5 days   Keep your follow-up appointment with your primary care as scheduled  Mucinex/guaifenesin plain is good to take for the congestion and would not raise your blood pressure.  Do NOT take products with phenylephrine or pseudoephedrine in it       ED Prescriptions     Medication Sig Dispense Auth. Provider   albuterol (VENTOLIN HFA) 108 (90 Base) MCG/ACT inhaler Inhale 2 puffs into the lungs every 4 (four) hours as needed for wheezing or shortness of breath. 1 each Zenia Resides, MD   predniSONE (DELTASONE) 20 MG tablet Take 2 tablets (40 mg total) by mouth daily with breakfast for 5 days. 10 tablet Zenia Resides, MD   levofloxacin (LEVAQUIN) 500 MG tablet Take 1 tablet (500 mg total) by mouth daily for 7 days. 7 tablet Jesyca Weisenburger, Janace Aris, MD      PDMP not reviewed this encounter.   Zenia Resides, MD 03/28/23 [phone]

## 2023-06-15 ENCOUNTER — Other Ambulatory Visit: Payer: Self-pay

## 2023-06-15 ENCOUNTER — Emergency Department (HOSPITAL_COMMUNITY)

## 2023-06-15 ENCOUNTER — Emergency Department (HOSPITAL_COMMUNITY)
Admission: EM | Admit: 2023-06-15 | Discharge: 2023-06-16 | Disposition: A | Discharge status: Home / Self Care | Attending: Emergency Medicine | Admitting: Emergency Medicine

## 2023-06-15 ENCOUNTER — Encounter (HOSPITAL_COMMUNITY): Payer: Self-pay | Admitting: Emergency Medicine

## 2023-06-15 DIAGNOSIS — Z7951 Long term (current) use of inhaled steroids: Secondary | ICD-10-CM | POA: Insufficient documentation

## 2023-06-15 DIAGNOSIS — I10 Essential (primary) hypertension: Secondary | ICD-10-CM | POA: Diagnosis not present

## 2023-06-15 DIAGNOSIS — Z79899 Other long term (current) drug therapy: Secondary | ICD-10-CM | POA: Diagnosis not present

## 2023-06-15 DIAGNOSIS — R079 Chest pain, unspecified: Secondary | ICD-10-CM

## 2023-06-15 DIAGNOSIS — E876 Hypokalemia: Secondary | ICD-10-CM | POA: Diagnosis not present

## 2023-06-15 DIAGNOSIS — D72829 Elevated white blood cell count, unspecified: Secondary | ICD-10-CM | POA: Diagnosis not present

## 2023-06-15 DIAGNOSIS — J449 Chronic obstructive pulmonary disease, unspecified: Secondary | ICD-10-CM | POA: Diagnosis not present

## 2023-06-15 DIAGNOSIS — R0789 Other chest pain: Secondary | ICD-10-CM | POA: Diagnosis present

## 2023-06-15 LAB — CBC
HCT: 40.9 % (ref 39.0–52.0)
Hemoglobin: 14 g/dL (ref 13.0–17.0)
MCH: 28.1 pg (ref 26.0–34.0)
MCHC: 34.2 g/dL (ref 30.0–36.0)
MCV: 82 fL (ref 80.0–100.0)
Platelets: 289 10*3/uL (ref 150–400)
RBC: 4.99 MIL/uL (ref 4.22–5.81)
RDW: 14.7 % (ref 11.5–15.5)
WBC: 10.9 10*3/uL — ABNORMAL HIGH (ref 4.0–10.5)
nRBC: 0 % (ref 0.0–0.2)

## 2023-06-15 LAB — BASIC METABOLIC PANEL WITH GFR
Anion gap: 9 (ref 5–15)
BUN: 5 mg/dL — ABNORMAL LOW (ref 8–23)
CO2: 16 mmol/L — ABNORMAL LOW (ref 22–32)
Calcium: 6.8 mg/dL — ABNORMAL LOW (ref 8.9–10.3)
Chloride: 115 mmol/L — ABNORMAL HIGH (ref 98–111)
Creatinine, Ser: 0.69 mg/dL (ref 0.61–1.24)
GFR, Estimated: >60 mL/min (ref >60)
Glucose, Bld: 83 mg/dL (ref 70–99)
Potassium: 2.3 mmol/L — CL (ref 3.5–5.1)
Sodium: 140 mmol/L (ref 135–145)

## 2023-06-15 LAB — TROPONIN I (HIGH SENSITIVITY): Troponin I (High Sensitivity): 8 ng/L (ref <18)

## 2023-06-15 MED ORDER — SODIUM CHLORIDE 0.9 % IV BOLUS
1000.0000 mL | Freq: Once | INTRAVENOUS | Status: AC
  Administered 2023-06-15: 1000 mL via INTRAVENOUS

## 2023-06-15 MED ORDER — MORPHINE SULFATE (PF) 4 MG/ML IV SOLN
4.0000 mg | Freq: Once | INTRAVENOUS | Status: AC
  Administered 2023-06-15: 4 mg via INTRAVENOUS
  Filled 2023-06-15: qty 1

## 2023-06-15 MED ORDER — POTASSIUM CHLORIDE 20 MEQ PO PACK
80.0000 meq | PACK | Freq: Once | ORAL | Status: AC
Start: 2023-06-15 — End: 2023-06-15
  Administered 2023-06-15: 80 meq via ORAL
  Filled 2023-06-15: qty 4

## 2023-06-15 MED ORDER — POTASSIUM CHLORIDE 10 MEQ/100ML IV SOLN
10.0000 meq | INTRAVENOUS | Status: AC
  Administered 2023-06-15 (×2): 10 meq via INTRAVENOUS
  Filled 2023-06-15 (×2): qty 100

## 2023-06-15 NOTE — ED Provider Notes (Signed)
 [facility] Provider Note   CSN: [id number] Arrival date & time: 06/15/23  2141     History  Chief Complaint  Patient presents with   Chest Pain    Paul Brady is a 68 y.o. male with a history of hypertension, COPD, and very presents the ED today for chest pain.  Patient reports about 2 hours prior to arrival he started have sharp pain at the center of the chest that radiates to his back. Pain is worse to touch.  Also endorses tingling in the left arm and lightheadedness.  Denies any associated shortness of breath or fevers.  Used cocaine about 2 hours prior to onset of chest pain. Was given nitroglycerin and aspirin by EMS en route to the ED, patient denies any improvement of symptoms with this. No additional complaints or concerns at this time.    Home Medications Prior to Admission medications   Medication Sig Start Date End Date Taking? Authorizing Provider  albuterol (VENTOLIN HFA) 108 (90 Base) MCG/ACT inhaler Inhale 2 puffs into the lungs every 4 (four) hours as needed for wheezing or shortness of breath. 03/28/23   Zenia Resides, MD  amLODipine-olmesartan (AZOR) 10-40 MG tablet Take 1 tablet by mouth daily.    [provider]  cyclobenzaprine (FLEXERIL) 10 MG tablet Take 1 tablet (10 mg total) by mouth 2 (two) times daily as needed for muscle spasms. 03/04/23   Curatolo, Adam, DO  Ergocalciferol (VITAMIN D2 PO) Take 1,250 mcg by mouth once a week.    [provider]  gabapentin (NEURONTIN) 800 MG tablet Take 800-1,600 mg by mouth 2 (two) times daily. As directed for neuropathy    [provider]  metoprolol succinate (TOPROL-XL) 25 MG 24 hr tablet Take 25 mg by mouth daily.    [provider]  Multiple Vitamins-Minerals (MULTIVITAMIN MEN 50+ PO) Take 1 tablet by mouth daily.    [provider]  oxyCODONE-acetaminophen (PERCOCET) 10-325 MG tablet Take 1 tablet by mouth every 4 (four)  hours as needed for pain.    [provider]  QUEtiapine (SEROQUEL) 400 MG tablet Take 400 mg by mouth at bedtime.  10/20/17   [provider]  sildenafil (VIAGRA) 50 MG tablet Take 50 mg by mouth daily as needed for erectile dysfunction.    [provider]  tamsulosin (FLOMAX) 0.4 MG CAPS capsule Take 0.4 mg by mouth daily.  09/08/17   [provider]  zolpidem (AMBIEN) 10 MG tablet Take 10 mg by mouth at bedtime as needed for sleep.    [provider]      Allergies    Mold extract [trichophyton], Molds & smuts, Penicillamine, Penicillin g, Trazodone, and Trazodone and nefazodone    Review of Systems   Review of Systems  Cardiovascular:  Positive for chest pain.  All other systems reviewed and are negative.   Physical Exam Updated Vital Signs BP 137/71   Pulse 75   Temp 98 F (36.7 C)   Resp 17   Ht 6\' 2"  (1.88 m)   Wt 99.8 kg   SpO2 99%   BMI 28.25 kg/m  Physical Exam Vitals and nursing note reviewed.  Constitutional:      General: He is not in acute distress.    Appearance: Normal appearance.  HENT:     Head: Normocephalic and atraumatic.     Mouth/Throat:     Mouth: Mucous membranes are moist.  Eyes:  Conjunctiva/sclera: Conjunctivae normal.     Pupils: Pupils are equal, round, and reactive to light.  Cardiovascular:     Rate and Rhythm: Normal rate and regular rhythm.     Pulses: Normal pulses.     Heart sounds: Normal heart sounds.  Pulmonary:     Effort: Pulmonary effort is normal.     Breath sounds: Normal breath sounds.     Comments: Tenderness to palpation of the center of the chest Chest:     Chest wall: Tenderness present.  Abdominal:     Palpations: Abdomen is soft.     Tenderness: There is no abdominal tenderness.  Musculoskeletal:        General: Normal range of motion.     Cervical back: Normal range of motion. No tenderness.     Right lower leg: No edema.     Left lower leg: No edema.      Comments: Tenderness to palpation of the paravertebral muscles and thoracic spine between the scapulas, without step-off or deformity. ROM, strength, and sensation of upper and lower extremities intact bilaterally. Radial pulses equal bilaterally.  Skin:    General: Skin is warm and dry.     Findings: No rash.  Neurological:     General: No focal deficit present.     Mental Status: He is alert.     Sensory: No sensory deficit.     Motor: No weakness.  Psychiatric:        Mood and Affect: Mood normal.        Behavior: Behavior normal.    ED Results / Procedures / Treatments   Labs (all labs ordered are listed, but only abnormal results are displayed) Labs Reviewed  BASIC METABOLIC PANEL WITH GFR - Abnormal; Notable for the following components:      Result Value   Potassium 2.3 (*)    Chloride 115 (*)    CO2 16 (*)    BUN 5 (*)    Calcium 6.8 (*)    All other components within normal limits  CBC - Abnormal; Notable for the following components:   WBC 10.9 (*)    All other components within normal limits  BASIC METABOLIC PANEL WITH GFR - Abnormal; Notable for the following components:   Glucose, Bld 101 (*)    BUN 6 (*)    Calcium 8.8 (*)    All other components within normal limits  MAGNESIUM  TROPONIN I (HIGH SENSITIVITY)  TROPONIN I (HIGH SENSITIVITY)    EKG EKG Interpretation Date/Time:  Sunday June 15 2023 21:47:12 EDT Ventricular Rate:  87 PR Interval:  148 QRS Duration:  107 QT Interval:  410 QTC Calculation: 494 R Axis:   58  Text Interpretation: Sinus rhythm RSR' in V1 or V2, probably normal variant Nonspecific T abnormalities, lateral leads Interpretation limited secondary to artifact Confirmed by Zadie Rhine ([id number]) on 06/15/2023 11:19:24 PM  Radiology CT Angio Chest Aorta W and/or Wo Contrast Result Date: 06/16/2023 CLINICAL DATA:  Chest pain EXAM: CT ANGIOGRAPHY CHEST WITH CONTRAST TECHNIQUE: Multidetector CT imaging of the chest was performed using  the standard protocol during bolus administration of intravenous contrast. Multiplanar CT image reconstructions and MIPs were obtained to evaluate the vascular anatomy. RADIATION DOSE REDUCTION: This exam was performed according to the departmental dose-optimization program which includes automated exposure control, adjustment of the mA and/or kV according to patient size and/or use of iterative reconstruction technique. CONTRAST:  75mL OMNIPAQUE IOHEXOL 350 MG/ML SOLN COMPARISON:  Chest x-ray 06/15/2023,  CT chest 11/02/2014 FINDINGS: Cardiovascular: Non contrasted images of the chest demonstrate no acute intramural hematoma. Aorta is nonaneurysmal. No dissection is seen. Coronary vascular calcification. Normal cardiac size. No pericardial effusion Mediastinum/Nodes: No enlarged mediastinal, hilar, or axillary lymph nodes. Thyroid gland, trachea, and esophagus demonstrate no significant findings. Lungs/Pleura: Emphysema. No acute airspace disease, pleural effusion, or pneumothorax. 4 mm subpleural left lower lobe pulmonary nodule on series 6, image 32. Upper Abdomen: No acute finding Musculoskeletal: No acute osseous abnormality. Review of the MIP images confirms the above findings. IMPRESSION: 1. Negative for aortic aneurysm or dissection.  Clear lung fields. 2. Multi-vessel coronary artery calcification. 3. 4 mm left lower lobe subpleural nodule. No follow-up needed if patient is low-risk.This recommendation follows the consensus statement: Guidelines for Management of Incidental Pulmonary Nodules Detected on CT Images: From the Fleischner Society 2017; Radiology 2017; 284:228-243. Emphysema (ICD10-J43.9). Electronically Signed   By: Jasmine Pang M.D.   On: 06/16/2023 01:38   DG Chest 2 View Result Date: 06/15/2023 CLINICAL DATA:  Chest pain EXAM: CHEST - 2 VIEW COMPARISON:  03/25/2023 FINDINGS: Hardware in the cervical spine. Patchy airspace disease in the left infrahilar lung. No pleural effusion or  pneumothorax IMPRESSION: Patchy airspace disease in the left infrahilar lung, pneumonia versus atelectasis. Electronically Signed   By: Jasmine Pang M.D.   On: 06/15/2023 22:23    Procedures Procedures: not indicated.   Medications Ordered in ED Medications  morphine (PF) 4 MG/ML injection 4 mg (4 mg Intravenous Given 06/15/23 2238)  potassium chloride (KLOR-CON) packet 80 mEq (80 mEq Oral Given 06/15/23 2246)  potassium chloride 10 mEq in 100 mL IVPB (0 mEq Intravenous Stopped 06/16/23 0057)  sodium chloride 0.9 % bolus 1,000 mL (0 mLs Intravenous Stopped 06/16/23 0103)  amLODipine (NORVASC) tablet 10 mg (10 mg Oral Given 06/16/23 0105)  iohexol (OMNIPAQUE) 350 MG/ML injection 75 mL (75 mLs Intravenous Contrast Given 06/16/23 0121)    ED Course/ Medical Decision Making/ A&P                                 Medical Decision Making Amount and/or Complexity of Data Reviewed Labs: ordered. Radiology: ordered.  Risk Prescription drug management.   This patient presents to the ED for concern of chest pain, this involves an extensive number of treatment options, and is a complaint that carries with it a high risk of complications and morbidity.   Differential diagnosis includes: ACS, aortic dissection, pneumonia, chest pain second to cocaine use, costochondritis, pleurisy, etc. Low suspicion for PE - not shortness of breath, tachypnea, or tachycardia.   Comorbidities  See HPI above   Additional History  Additional history obtained from prior records   Cardiac Monitoring / EKG  The patient was maintained on a cardiac monitor.  I personally viewed and interpreted the cardiac monitored which showed: sinus rhythm with a heart rate of 87 bpm.   Lab Tests  I ordered and personally interpreted labs.  The pertinent results include:   Potassium of 2.3 on BMP, otherwise reassuring Repeat BMP with potassium of 3.8 Slightly elevated WBC of 10.9 otherwise CBC is unremarkable Initial  troponin of 8, delta troponin of 9 Magnesium is unremarkable   Imaging Studies  I ordered imaging studies including CXR and CTA dissection study  I independently visualized and interpreted imaging which showed:  Patchy airspace disease in the left infrahilar seen on CXR - pneumonia vs atelectasis. CTA negative  for aortic aneurysm or dissection.  Clear lung fields.  Multivessel coronary artery calcification.  4 mm left lower lobe subpleural nodule. I agree with the radiologist interpretation   Problem List / ED Course / Critical Interventions / Medication Management  Patient reports centralized chest pain that radiates to his back that began 2 hours after using cocaine.  States that pain has been persistent since onset and is worse to touch.  Pain is not worse with deep breathing.  Reports episodes of chest pain in the past but states that this feels worse.  Has associated lightheadedness and left arm tingling. He was given aspirin 324 mg and nitroglycerin by EMS without improvement of symptoms. Patient staffed with my attending, Dr. Bebe Shaggy. I ordered medications including: Morphine for pain  Oral and IV potassium with NS for hypokalemia Amlodipine for elevated blood pressure readings Reevaluation of the patient after these medicines showed that the patient resolved I have reviewed the patients home medicines and have made adjustments as needed   Social Determinants of Health  Substance use   Test / Admission - Considered  Discussed findings with patient.  All questions were answered. He is hemodynamically stable and safe for discharge home.  Advise close primary care follow-up. Strict return precautions given.       Final Clinical Impression(s) / ED Diagnoses Final diagnoses:  Chest pain, unspecified type    Rx / DC Orders ED Discharge Orders     None         Maxwell Marion, PA-C 06/16/23 Harley Alto, MD 06/16/23 [phone]

## 2023-06-15 NOTE — ED Provider Notes (Incomplete)
 [place] EMERGENCY DEPARTMENT AT [facility] Provider Note   CSN: [id number] Arrival date & time: 06/15/23  2141     History {Add pertinent medical, surgical, social history, OB history to HPI:1} Chief Complaint  Patient presents with  . Chest Pain    Cap Massi is a 68 y.o. male with a history of hypertension, COPD, and very presents the ED today for chest pain.  Patient reports about 2 hours prior to arrival he started have sharp pain at the center of the chest that radiates to his back.  Pain is worse to touch.  Also endorses tingling in the left arm and lightheadedness.  Denies any associated shortness of breath or fevers.  Was given nitro and aspirin by EMS en route to the ED, patient denies any improvement of symptoms with this.   Chest Pain      Home Medications Prior to Admission medications   Medication Sig Start Date End Date Taking? Authorizing Provider  albuterol (VENTOLIN HFA) 108 (90 Base) MCG/ACT inhaler Inhale 2 puffs into the lungs every 4 (four) hours as needed for wheezing or shortness of breath. 03/28/23   Zenia Resides, MD  amLODipine-olmesartan (AZOR) 10-40 MG tablet Take 1 tablet by mouth daily.    [provider]  cyclobenzaprine (FLEXERIL) 10 MG tablet Take 1 tablet (10 mg total) by mouth 2 (two) times daily as needed for muscle spasms. 03/04/23   Curatolo, Adam, DO  Ergocalciferol (VITAMIN D2 PO) Take 1,250 mcg by mouth once a week.    [provider]  gabapentin (NEURONTIN) 800 MG tablet Take 800-1,600 mg by mouth 2 (two) times daily. As directed for neuropathy    [provider]  metoprolol succinate (TOPROL-XL) 25 MG 24 hr tablet Take 25 mg by mouth daily.    [provider]  Multiple Vitamins-Minerals (MULTIVITAMIN MEN 50+ PO) Take 1 tablet by mouth daily.    [provider]  oxyCODONE-acetaminophen (PERCOCET) 10-325 MG tablet Take 1 tablet by mouth every 4 (four) hours as needed for pain.     [provider]  QUEtiapine (SEROQUEL) 400 MG tablet Take 400 mg by mouth at bedtime.  10/20/17   [provider]  sildenafil (VIAGRA) 50 MG tablet Take 50 mg by mouth daily as needed for erectile dysfunction.    [provider]  tamsulosin (FLOMAX) 0.4 MG CAPS capsule Take 0.4 mg by mouth daily.  09/08/17   [provider]  zolpidem (AMBIEN) 10 MG tablet Take 10 mg by mouth at bedtime as needed for sleep.    [provider]      Allergies    Mold extract [trichophyton], Molds & smuts, Penicillamine, Penicillin g, Trazodone, and Trazodone and nefazodone    Review of Systems   Review of Systems  Cardiovascular:  Positive for chest pain.  All other systems reviewed and are negative.   Physical Exam Updated Vital Signs BP (!) 155/98   Pulse 74   Temp 98 F (36.7 C)   Resp (!) 22   Ht 6\' 2"  (1.88 m)   Wt 99.8 kg   SpO2 100%   BMI 28.25 kg/m  Physical Exam Vitals and nursing note reviewed.  Constitutional:      General: He is not in acute distress.    Appearance: Normal appearance.  HENT:     Head: Normocephalic and atraumatic.     Mouth/Throat:     Mouth: Mucous membranes are moist.  Eyes:     Conjunctiva/sclera:  Conjunctivae normal.     Pupils: Pupils are equal, round, and reactive to light.  Cardiovascular:     Rate and Rhythm: Normal rate and regular rhythm.     Pulses: Normal pulses.     Heart sounds: Normal heart sounds.  Pulmonary:     Effort: Pulmonary effort is normal.     Breath sounds: Normal breath sounds.     Comments: Tenderness to palpation of the center of the chest Chest:     Chest wall: Tenderness present.  Abdominal:     Palpations: Abdomen is soft.     Tenderness: There is no abdominal tenderness.  Musculoskeletal:        General: Normal range of motion.     Cervical back: Normal range of motion. No tenderness.     Right lower leg: No edema.     Left lower leg: No edema.     Comments: Tenderness to  palpation of the paravertebral muscles and thoracic spine between the scapulas, without step-off or deformity. ROM, strength, and sensation of upper and lower extremities intact bilaterally. Radial pulses equal bilaterally.  Skin:    General: Skin is warm and dry.     Findings: No rash.  Neurological:     General: No focal deficit present.     Mental Status: He is alert.     Sensory: No sensory deficit.     Motor: No weakness.  Psychiatric:        Mood and Affect: Mood normal.        Behavior: Behavior normal.    ED Results / Procedures / Treatments   Labs (all labs ordered are listed, but only abnormal results are displayed) Labs Reviewed  BASIC METABOLIC PANEL WITH GFR - Abnormal; Notable for the following components:      Result Value   Potassium 2.3 (*)    Chloride 115 (*)    CO2 16 (*)    BUN 5 (*)    Calcium 6.8 (*)    All other components within normal limits  CBC - Abnormal; Notable for the following components:   WBC 10.9 (*)    All other components within normal limits  MAGNESIUM  TROPONIN I (HIGH SENSITIVITY)    EKG None  Radiology DG Chest 2 View Result Date: 06/15/2023 CLINICAL DATA:  Chest pain EXAM: CHEST - 2 VIEW COMPARISON:  03/25/2023 FINDINGS: Hardware in the cervical spine. Patchy airspace disease in the left infrahilar lung. No pleural effusion or pneumothorax IMPRESSION: Patchy airspace disease in the left infrahilar lung, pneumonia versus atelectasis. Electronically Signed   By: Jasmine Pang M.D.   On: 06/15/2023 22:23    Procedures Procedures  {Document cardiac monitor, telemetry assessment procedure when appropriate:1}  Medications Ordered in ED Medications  potassium chloride (KLOR-CON) packet 80 mEq (has no administration in time range)  potassium chloride 10 mEq in 100 mL IVPB (has no administration in time range)  morphine (PF) 4 MG/ML injection 4 mg (4 mg Intravenous Given 06/15/23 2238)    ED Course/ Medical Decision Making/ A&P   {    Click here for ABCD2, HEART and other calculatorsREFRESH Note before signing :1}                              Medical Decision Making Amount and/or Complexity of Data Reviewed Labs: ordered. Radiology: ordered.  Risk Prescription drug management.   This patient presents to the ED for concern of chest  pain, this involves an extensive number of treatment options, and is a complaint that carries with it a high risk of complications and morbidity.   Differential diagnosis includes: ACS, pneumonia,   Comorbidities  See HPI above   Additional History  Additional history obtained from prior records   Cardiac Monitoring / EKG  The patient was maintained on a cardiac monitor.  I personally viewed and interpreted the cardiac monitored which showed: *** with a heart rate of *** bpm.   Lab Tests  I ordered and personally interpreted labs.  The pertinent results include:   Potassium of 2.3 on BMP, otherwise reassuring slightly elevated WBC of 10.9 otherwise CBC is unremarkable Initial troponin of 8   Imaging Studies  I ordered imaging studies including CXR  I independently visualized and interpreted imaging which showed:  Patchy airspace disease in the left infrahilar - pneumonia vs atelectasis. I agree with the radiologist interpretation   Consultations  I requested consultation with ***,  and discussed lab and imaging findings as well as pertinent plan - they recommend: ***   Problem List / ED Course / Critical Interventions / Medication Management  *** I ordered medications including: Morphine for pain  Oral and IV potassium for hypokalemia Reevaluation of the patient after these medicines showed that the patient {resolved/improved/worsened:23923::"improved"} I have reviewed the patients home medicines and have made adjustments as needed   Social Determinants of Health  Substance use   Test / Admission - Considered  ***   {Document critical care time  when appropriate:1} {Document review of labs and clinical decision tools ie heart score, Chads2Vasc2 etc:1}  {Document your independent review of radiology images, and any outside records:1} {Document your discussion with family members, caretakers, and with consultants:1} {Document social determinants of health affecting pt's care:1} {Document your decision making why or why not admission, treatments were needed:1} Final Clinical Impression(s) / ED Diagnoses Final diagnoses:  None    Rx / DC Orders ED Discharge Orders     None

## 2023-06-15 NOTE — ED Triage Notes (Signed)
 Pt BIB EMS from home with c/o central chest pain that radiates to his back and left arm tingling. Endorses cocaine x 4 hours prior to pain starting.  18G RAC  324 ASA 0.4 SL nitroglycerin  183/104

## 2023-06-15 NOTE — ED Notes (Signed)
 Date and time results received: 06/15/23 2240 (use smartphrase ".now" to insert current time)  Test: potassium Critical Value: 2.3  Name of Provider Notified: Erie Noe., PA  Orders Received? Or Actions Taken?:  n/a

## 2023-06-16 ENCOUNTER — Emergency Department (HOSPITAL_COMMUNITY)

## 2023-06-16 DIAGNOSIS — R0789 Other chest pain: Secondary | ICD-10-CM | POA: Diagnosis not present

## 2023-06-16 LAB — BASIC METABOLIC PANEL WITH GFR
Anion gap: 10 (ref 5–15)
BUN: 6 mg/dL — ABNORMAL LOW (ref 8–23)
CO2: 23 mmol/L (ref 22–32)
Calcium: 8.8 mg/dL — ABNORMAL LOW (ref 8.9–10.3)
Chloride: 106 mmol/L (ref 98–111)
Creatinine, Ser: 1.05 mg/dL (ref 0.61–1.24)
GFR, Estimated: >60 mL/min (ref >60)
Glucose, Bld: 101 mg/dL — ABNORMAL HIGH (ref 70–99)
Potassium: 3.8 mmol/L (ref 3.5–5.1)
Sodium: 139 mmol/L (ref 135–145)

## 2023-06-16 LAB — MAGNESIUM: Magnesium: 2 mg/dL (ref 1.7–2.4)

## 2023-06-16 LAB — TROPONIN I (HIGH SENSITIVITY): Troponin I (High Sensitivity): 9 ng/L (ref <18)

## 2023-06-16 MED ORDER — IOHEXOL 350 MG/ML SOLN
75.0000 mL | Freq: Once | INTRAVENOUS | Status: AC | PRN
  Administered 2023-06-16: 75 mL via INTRAVENOUS

## 2023-06-16 MED ORDER — AMLODIPINE BESYLATE 5 MG PO TABS
10.0000 mg | ORAL_TABLET | Freq: Once | ORAL | Status: AC
  Administered 2023-06-16: 10 mg via ORAL
  Filled 2023-06-16: qty 2

## 2023-06-16 NOTE — Discharge Instructions (Addendum)
 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 Avoid using drugs to prevent further chest pain/injury to the heart muscle.  Your CT showed a nodule in your left lower lung. You will need repeat imaging for reevaluation of the nodule. You can get this done with your PCP. Follow up with them in the next 3-5 days for reevaluation of your symptoms.  Get help right away if: You feel sick to your stomach (nauseous) or you throw up (vomit). You feel sweaty or light-headed. You have a cough with mucus from your lungs (sputum) or you cough up blood. You are short of breath.

## 2023-10-08 ENCOUNTER — Other Ambulatory Visit: Payer: Self-pay | Admitting: Registered Nurse

## 2023-10-08 DIAGNOSIS — F17210 Nicotine dependence, cigarettes, uncomplicated: Secondary | ICD-10-CM

## 2023-10-17 ENCOUNTER — Ambulatory Visit
Admission: RE | Admit: 2023-10-17 | Discharge: 2023-10-17 | Disposition: A | Discharge status: Home / Self Care | Source: Ambulatory Visit | Attending: Registered Nurse | Admitting: Registered Nurse

## 2023-10-17 DIAGNOSIS — F17210 Nicotine dependence, cigarettes, uncomplicated: Secondary | ICD-10-CM

## 2024-01-01 ENCOUNTER — Ambulatory Visit: Payer: Self-pay

## 2024-01-01 NOTE — Telephone Encounter (Signed)
 Copied from CRM [id number]. Topic: Clinical - Red Word Triage >> Jan 01, 2024  2:17 PM Leila BROCKS wrote: Red Word that prompted transfer to Nurse Triage: Patient [phone] states the last 2 weeks more chest pain, vomitting in the morrning, appetite is diminished, shortness of breath. Patient has not spoken with pcp on this issues. Patient is trying to schedule an apppointment, has a referral. NPT Insomnia, unspecified referred by NP, Yercheck. Please advise. Reason for Disposition  [1] MILD longstanding difficulty breathing (e.g., minimal/no SOB at rest, SOB with walking, pulse < 100) AND [2] SAME as normal    Pt  calling to establish care with [facility] for insomnia but is also complaining of SOB. Pt has hx of COPD and endorses following pulmonologist elsewhere. [facility] advised pt to continue following with PCP until he can be established [facility].  Answer Assessment - Initial Assessment Questions 1. RESPIRATORY STATUS: Describe your breathing? (e.g., wheezing, shortness of breath, unable to speak, severe coughing)      SOB, productive clear cough 2. ONSET: When did this breathing problem begin?      A while ago - has worsened recently  About a year 3. PATTERN Does the difficult breathing come and go, or has it been constant since it started?      Comes and goes 4. SEVERITY: How bad is your breathing? (e.g., mild, moderate, severe)      Mild with exertion [facility] does not appreciate audible SOB/wheezing during call. Pt is speaking in full sentences.  5. RECURRENT SYMPTOM: Have you had difficulty breathing before? If Yes, ask: When was the last time? and What happened that time?      First time. 6. CARDIAC HISTORY: Do you have any history of heart disease? (e.g., heart attack, angina, bypass surgery, angioplasty)      Unable to answer. Endorses having to take nitroglycerin  - last took 1.5 weeks ago with relief. 7. LUNG HISTORY: Do you have any history of lung disease?  (e.g.,  pulmonary embolus, asthma, emphysema)     COPD - reports has pulmonologist elsewhere ([facility]?) - pt is willing to TOC pulm and sleep to [facility] 8. CAUSE: What do you think is causing the breathing problem?      unknown 9. OTHER SYMPTOMS: Do you have any other symptoms? (e.g., chest pain, cough, dizziness, fever, runny nose)    generalized fatigue, endorses heart burn/gas 10. O2 SATURATION MONITOR:  Do you use an oxygen  saturation monitor (pulse oximeter) at home? If Yes, ask: What is your reading (oxygen  level) today? What is your usual oxygen  saturation reading? (e.g., 95%)       N/a 11. PREGNANCY: Is there any chance you are pregnant? When was your last menstrual period?       N/a 12. TRAVEL: Have you traveled out of the country in the last month? (e.g., travel history, exposures)       N/a  Protocols used: Breathing Difficulty-A-AH

## 2024-01-27 ENCOUNTER — Ambulatory Visit

## 2024-01-27 VITALS — BP 156/90 | HR 70 | Ht 74.0 in | Wt 199.0 lb

## 2024-01-27 DIAGNOSIS — Z716 Tobacco abuse counseling: Secondary | ICD-10-CM

## 2024-01-27 DIAGNOSIS — F17210 Nicotine dependence, cigarettes, uncomplicated: Secondary | ICD-10-CM

## 2024-01-27 DIAGNOSIS — Z122 Encounter for screening for malignant neoplasm of respiratory organs: Secondary | ICD-10-CM

## 2024-01-27 DIAGNOSIS — G4733 Obstructive sleep apnea (adult) (pediatric): Secondary | ICD-10-CM | POA: Diagnosis not present

## 2024-01-27 DIAGNOSIS — J439 Emphysema, unspecified: Secondary | ICD-10-CM | POA: Diagnosis not present

## 2024-01-27 DIAGNOSIS — F32A Depression, unspecified: Secondary | ICD-10-CM

## 2024-01-27 DIAGNOSIS — G4709 Other insomnia: Secondary | ICD-10-CM | POA: Diagnosis not present

## 2024-01-27 DIAGNOSIS — J301 Allergic rhinitis due to pollen: Secondary | ICD-10-CM

## 2024-01-27 MED ORDER — RAMELTEON 8 MG PO TABS
8.0000 mg | ORAL_TABLET | Freq: Every day | ORAL | 3 refills | Status: AC
Start: 2024-01-27 — End: ?

## 2024-01-27 NOTE — Assessment & Plan Note (Signed)
 SABRA

## 2024-01-27 NOTE — Progress Notes (Addendum)
 "  Pulmonology Office Visit   Subjective:  Patient ID: Paul Brady, male    DOB: 08-20-1955  MRN: [id number]  Referred by: Paul Rojelio PARAS, Paul Brady  CC:  Chief Complaint  Patient presents with   Establish Care    New sleep     HPI Paul Brady is a 68 y.o. male with Hypertension, DM 2, BPH, hyperlipidemia, depression, cervical pain presents for evaluation of insomnia.  Respective notes from provider reviewed as appropriate to gather relevant information for patient care.   Discussed the use of AI scribe software for clinical note transcription with the patient, who gave verbal consent to proceed.  History of Present Illness   Paul Brady is a 68 year old male with insomnia who presents for evaluation of his insomnia.  He experiences difficulty sleeping after discontinuation of Seroquel  and zaleplon due to concerns about potential Alzheimer's risk. Previously, he was sleeping well with these medications, going to bed around 11 PM, taking about an hour to fall asleep, and waking up around 5:30 AM. Currently, he does not take naps and feels unrefreshed in the morning, describing his body as 'dragging' and experiencing increased pain. He wakes up gasping for air due to sleep apnea, which disrupts his sleep multiple times a night, and he sometimes struggles to fall back asleep. No nightmares or night terrors, but he reports light sleep where 'anything can wake me up'.  He has a history of sleep apnea diagnosed last year at [facility] where a sleep study indicated he stopped breathing 60-70 times per night, particularly when lying on his back. He owns a CPAP machine but finds the mask too bulky and does not use it. He reports better breathing when sleeping on his side.  He has COPD and uses albuterol  and Advair for management. He has a significant smoking history, having smoked since age 89, and is currently trying to quit, reducing his intake to half a pack per day from a  previous one and a half packs. He experiences shortness of breath after walking one block at a slow pace. 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  He is on pain management for chronic pain, taking Neurontin , Flexeril , and oxycodone  for the past three to four years. He reports that these medications help with pain but not with sleep.  He has a history of depression and was previously on multiple medications, which were discontinued. He is scheduled to see a psychiatry group, [facility], on December 10th. He reports that his depression may be exacerbated by his lack of sleep.      OSA history: Dx in 2024 at [facility] per patient supine dominant w supine AHI of >60. placed on CPAP.   Lung Health: Functional status: 1 block.  Smoking: current smoker 1.5pkx 58 yrs. Now 1/2 pk.  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  PRIOR TESTS and IMAGING: CT Chest:  LDCT August 2025: Lung RADS 2.  Underlying emphysematous changes.  Addendum: NPSG and titration study April 2024 from [place]: Mild OSA with AHI 11.4, REM AHI 38.4.  CPAP 12-16 cm H2O recommended.       01/27/2024    1:00 PM  Results of the Epworth flowsheet  Sitting and reading 0  Watching TV 1  Sitting, inactive in a public place (e.g. a theatre or a meeting) 0  As a passenger in  a car for an hour without a break 0  Lying down to rest in the afternoon when circumstances permit 0  Sitting and talking to someone 0  Sitting quietly after a lunch without alcohol 0  In a car, while stopped for a few minutes in traffic 0  Total score 1    Allergies: Mold extract [trichophyton], Molds & smuts, Penicillamine, Penicillin g, Trazodone, and Trazodone and nefazodone  Current Outpatient Medications:    albuterol  (VENTOLIN  HFA) 108 (90 Base) MCG/ACT inhaler, Inhale 2 puffs  into the lungs every 4 (four) hours as needed for wheezing or shortness of breath., Disp: 1 each, Rfl: 0   amLODipine -olmesartan (AZOR) 10-40 MG tablet, Take 1 tablet by mouth daily., Disp: , Rfl:    cyclobenzaprine  (FLEXERIL ) 10 MG tablet, Take 1 tablet (10 mg total) by mouth 2 (two) times daily as needed for muscle spasms., Disp: 20 tablet, Rfl: 0   Ergocalciferol (VITAMIN D2 PO), Take 1,250 mcg by mouth once a week., Disp: , Rfl:    gabapentin  (NEURONTIN ) 800 MG tablet, Take 800-1,600 mg by mouth 2 (two) times daily. As directed for neuropathy, Disp: , Rfl:    metoprolol  succinate (TOPROL -XL) 25 MG 24 hr tablet, Take 25 mg by mouth daily., Disp: , Rfl:    Multiple Vitamins-Minerals (MULTIVITAMIN MEN 50+ PO), Take 1 tablet by mouth daily., Disp: , Rfl:    oxyCODONE -acetaminophen  (PERCOCET) 10-325 MG tablet, Take 1 tablet by mouth every 4 (four) hours as needed for pain., Disp: , Rfl:    QUEtiapine  (SEROQUEL ) 400 MG tablet, Take 400 mg by mouth at bedtime. , Disp: , Rfl:    ramelteon  (ROZEREM ) 8 MG tablet, Take 1 tablet (8 mg total) by mouth at bedtime., Disp: 30 tablet, Rfl: 3   sildenafil (VIAGRA) 50 MG tablet, Take 50 mg by mouth daily as needed for erectile dysfunction., Disp: , Rfl:    tamsulosin  (FLOMAX ) 0.4 MG CAPS capsule, Take 0.4 mg by mouth daily. , Disp: , Rfl:  Past Medical History:  Diagnosis Date   Achilles rupture, right, subsequent encounter    Anxiety    Aortic atherosclerosis    Cannabis dependence, uncomplicated (HCC)    Chest pain    COPD (chronic obstructive pulmonary disease) (HCC)    Depression    Diverticulitis    GERD (gastroesophageal reflux disease)    Hypercholesterolemia    Hyperlipidemia    Hypertension    Lumbosacral pain, chronic    gabapentin , cyclobenzaprine    Prediabetes    Radiculopathy of leg    gabapentin    Right carpal tunnel syndrome    Sleep apnea in adult    Vitamin D deficiency    Past Surgical History:  Procedure Laterality Date    ACHILLES TENDON SURGERY Right 01/07/2018   Procedure: OPEN ACHILLES TENDON REPAIR;  Surgeon: Paul Ingle, Paul Brady;  Location: [facility];  Service: Orthopedics;  Laterality: Right;  Popliteal Block   ANTERIOR CERVICAL DECOMP/DISCECTOMY FUSION N/A 03/24/2013   Procedure: ACDF C5 - C7 2 LEVELS;  Surgeon: Paul Sprang, Paul Brady;  Location: [facility];  Service: Orthopedics;  Laterality: N/A;   CARPAL TUNNEL RELEASE     right    CAST APPLICATION Right 01/07/2018   Procedure: APPLICATION OF SHORT LEG CAST;  Surgeon: Paul Ingle, Paul Brady;  Location: [facility];  Service: Orthopedics;  Laterality: Right;  Popliteal Block   EYE SURGERY     bilateral catawract surgery with lens implant   HERNIA REPAIR  1980's   INGUINAL HERNIA REPAIR Bilateral  10/14/2014   Procedure: LAPAROSCOPIC BILATERAL INGUINAL HERNIA REPAIRS WITH MESH;  Surgeon: Paul Schultze, Paul Brady;  Location: [facility];  Service: General;  Laterality: Bilateral;   WRIST FUSION Right 04/2012   Family History  Problem Relation Age of Onset   Hypertension Mother        Living 97   Other Father        Work-related injury   Mental illness Sister    Mental illness Sister    Social History   Socioeconomic History   Marital status: Legally Separated    Spouse name: Not on file   Number of children: 2   Years of education: Not on file   Highest education level: Not on file  Occupational History   Occupation: disability  Tobacco Use   Smoking status: Every Day    Current packs/day: 0.25    Average packs/day: 0.3 packs/day for 40.0 years (10.0 ttl pk-yrs)    Types: Cigarettes   Smokeless tobacco: Never  Vaping Use   Vaping status: Never Used  Substance and Sexual Activity   Alcohol use: Yes    Comment: Occasionally   Drug use: No   Sexual activity: Not on file  Other Topics Concern   Not on file  Social History Narrative   Lives alone in a one story home.  Has 2 sons.    He was last working in 2007 at which time he was doing work with paving and  concrete/gutters.   On disability since 2010 for bipolar depression and chronic back pain   Social Drivers of Corporate Investment Banker Strain: Not on file  Food Insecurity: Not on file  Transportation Needs: Not on file  Physical Activity: Not on file  Stress: Not on file  Social Connections: Not on file  Intimate Partner Violence: Not on file       Objective:  BP (!) 156/90   Pulse 70   Ht 6' 2 (1.88 m)   Wt 199 lb (90.3 kg)   SpO2 100%   BMI 25.55 kg/m  BMI Readings from Last 3 Encounters:  01/27/24 25.55 kg/m  06/15/23 28.25 kg/m  03/28/23 28.25 kg/m    Physical Exam: Physical Exam   ENT: Normal mucosa. No hypertrophy of inferior turbinates. Tonsils are normal sized. Modified Mallampati score is normal. PULMONARY: Lungs clear to auscultation bilaterally, no wheezing. CARDIOVASCULAR: Regular rate and rhythm, S1 S2 normal, no murmurs. ABDOMEN: Abdomen soft, nontender. Bowel sounds are normal. EXTREMITIES: No peripheral edema noted.       Diagnostic Review:  Last metabolic panel Lab Results  Component Value Date   GLUCOSE 101 (H) 06/15/2023   NA 139 06/15/2023   K 3.8 06/15/2023   CL 106 06/15/2023   CO2 23 06/15/2023   BUN 6 (L) 06/15/2023   CREATININE 1.05 06/15/2023   GFRNONAA >60 06/15/2023   CALCIUM 8.8 (L) 06/15/2023   PHOS 3.0 02/07/2016   PROT 6.9 01/02/2018   ALBUMIN 3.8 01/02/2018   BILITOT 0.5 01/02/2018   ALKPHOS 67 01/02/2018   AST 21 01/02/2018   ALT 18 01/02/2018   ANIONGAP 10 06/15/2023         Assessment & Plan:   Assessment & Plan OSA (obstructive sleep apnea)     Chronic obstructive pulmonary disease with emphysema, unspecified emphysema type (HCC)  Orders:   Pulmonary Function Test; Future  Encounter for smoking cessation counseling  Orders:   Ambulatory referral to Virtual Care Smoking Cessation  Allergic rhinitis due to pollen,  unspecified seasonality     Encounter for screening for lung cancer Cont to  follow with PCP who is doing your lung cancer screening.     Depression, unspecified depression type        Assessment and Plan    Insomnia: Chronic insomnia exacerbated by obstructive sleep apnea and depression. Previous medications discontinued due to Alzheimer's risk. Depression may contribute. Trazodone contraindicated due to priapism. CPAP intolerance due to mask issues. - Ordered CPAP mask change to nasal pillow. - Referred to sleep lab for titration if mask change is ineffective. - Encouraged follow-up with psychiatry for depression management. -added rozerem .   Obstructive sleep apnea with CPAP intolerance Severe obstructive sleep apnea with apneas 60-70 times per hour when supine. CPAP intolerance due to mask discomfort. - Ordered CPAP mask change to nasal pillow.  Chronic obstructive pulmonary disease (COPD) COPD seen on low-dose CT scan. Managed with albuterol  and Advair. Limited exercise tolerance. Lung function test pending. - Continue albuterol  and Advair. - Ordered lung function test to assess current status.  Tobacco use disorder Long-standing tobacco use, currently smoking half a pack per day. Interested in quitting. Chantix deferred due to potential side effects and depression/SI history. - Referred to smoking cessation support services. - will consider chantix in future.  - patches causes local reaction.   Allergic rhinitis due to pollen Managed with Flonase . - Continue Flonase . - Add saline nasal spray as needed.       Smoking/Tobacco Cessation Counseling Paul Brady is a current user of tobacco or nicotine  products. He is ready to quit at this time. Counseling provided today addressed the risks of continued use and the benefits of cessation. Discussed tobacco/nicotine  use history, readiness to quit, and evidence-based treatment options including behavioral strategies, support resources, and pharmacologic therapies. Provided encouragement and  educational materials on steps and resources to quit smoking. Patient questions were addressed, and follow-up recommended for continued support. Total time spent on counseling: 7 minutes. REF sent to smoking cessation.    He was counselled about not driving while drowsy which is common side effect of sleep related disorders.   Return in about 3 months (around 04/28/2024).   I personally spent a total of 30 minutes in the care of the patient today including preparing to see the patient, getting/reviewing separately obtained history, performing a medically appropriate exam/evaluation, counseling and educating, placing orders, documenting clinical information in the EHR, independently interpreting results, and communicating results.   Paul Fredericks, Paul Brady "

## 2024-01-27 NOTE — Patient Instructions (Addendum)
  VISIT SUMMARY: During your visit, we discussed your ongoing issues with insomnia, obstructive sleep apnea, COPD, tobacco use, and allergic rhinitis. We reviewed your current symptoms, medications, and history, and made several adjustments to your treatment plan to help improve your overall health and well-being.  YOUR PLAN: -INSOMNIA IN THE SETTING OF OBSTRUCTIVE SLEEP APNEA: Your chronic insomnia is being worsened by your obstructive sleep apnea. We have ordered a change in your CPAP mask to a nasal pillow, which may be more comfortable for you. If this change does not help, you will be referred to a sleep lab for further troubleshooting. It is also important to follow up with your psychiatry appointment to manage your depression, which may be contributing to your sleep issues. Added rozerem to help sleep. Take at 11 pm.   -OBSTRUCTIVE SLEEP APNEA WITH CPAP INTOLERANCE: You have severe obstructive sleep apnea, which means you stop breathing multiple times per hour while sleeping, especially when lying on your back. We have ordered a change in your CPAP mask to a nasal pillow to improve comfort. If this does not help, you will be referred to a sleep lab for further troubleshooting.  -CHRONIC OBSTRUCTIVE PULMONARY DISEASE (COPD): COPD is a lung condition that makes it hard to breathe. You are currently managing it with albuterol  and Advair. We have ordered a lung function test to assess your current lung status. Please continue using your medications as prescribed.  -TOBACCO USE DISORDER: You have a long history of smoking and are currently smoking half a pack per day. We have referred you to smoking cessation support services to help you quit. A lung function test has also been ordered to assess your current lung health.  -ALLERGIC RHINITIS DUE TO POLLEN: Allergic rhinitis is an allergic reaction that causes sneezing, congestion, and a runny nose. You are currently managing it with Flonase . We  recommend continuing with Flonase  and adding a saline nasal spray as needed.  INSTRUCTIONS: Please follow up with the sleep lab if the new CPAP mask does not improve your sleep. Continue with your psychiatry appointment on December 10th for depression management. Use your albuterol  and Advair as prescribed and attend the lung function test as scheduled. Engage with the smoking cessation support services to help you quit smoking. Continue using Flonase  for your allergic rhinitis and add saline nasal spray as needed.                      Contains text generated by Abridge.                                 Contains text generated by Abridge.

## 2024-02-17 ENCOUNTER — Telehealth: Payer: Self-pay

## 2024-02-17 NOTE — Telephone Encounter (Signed)
 Received fax from [facility] stating that they need a copy of the pt's diagnostic sleep study for them to send new CPAP supplies.  Sleep study is not in the pt's chart, but LOV notes state that it was done through [facility]. [facility] in [place] on [address] to obtain a copy of pt's sleep study.   Mercedes at [facility] states she will send the sleep study. I provided our fax number. Once the sleep study has been faxed to us , I will fax a copy to [facility].   Awaiting fax from [facility].

## 2024-02-17 NOTE — Progress Notes (Incomplete)
  Cardiology Office Note:   Date:  02/17/2024  ID:  [name], DOB 02-Aug-1955, MRN [id number] PCP: Leron Millman, NP  [facility] Providers Cardiologist:  None { Chief Complaint: No chief complaint on file.     History of Present Illness:   Paul Brady is a 68 y.o. male with a PMH of CAD s/p PCI, HTN, HLD, prediabetes, COPD, tobacco use disorder, OSA, chronic pain who presents as a new patient referral by Millman Leron for evaluation of fatigue.   Past Medical History:  Diagnosis Date   Achilles rupture, right, subsequent encounter    Anxiety    Aortic atherosclerosis    Cannabis dependence, uncomplicated (HCC)    Chest pain    COPD (chronic obstructive pulmonary disease) (HCC)    Depression    Diverticulitis    GERD (gastroesophageal reflux disease)    Hypercholesterolemia    Hyperlipidemia    Hypertension    Lumbosacral pain, chronic    gabapentin , cyclobenzaprine    Prediabetes    Radiculopathy of leg    gabapentin    Right carpal tunnel syndrome    Sleep apnea in adult    Vitamin D deficiency      Studies Reviewed:    EKG: ***           Risk Assessment/Calculations:   {Does this patient have ATRIAL FIBRILLATION?:[id number]} No BP recorded.  {Refresh Note OR Click here to enter BP  :1}***        Physical Exam:     VS:  There were no vitals taken for this visit. ***    Wt Readings from Last 3 Encounters:  01/27/24 199 lb (90.3 kg)  06/15/23 220 lb 0.3 oz (99.8 kg)  03/28/23 220 lb (99.8 kg)     GEN: Well nourished, well developed, in no acute distress NECK: No JVD; No carotid bruits CARDIAC: ***RRR, no murmurs, rubs, gallops RESPIRATORY:  Clear to auscultation without rales, wheezing or rhonchi  ABDOMEN: Soft, non-tender, non-distended, normal bowel sounds EXTREMITIES:  Warm and well perfused, no edema; No deformity, 2+ radial pulses PSYCH: Normal mood and affect   Assessment & Plan       {Are you ordering a CV Procedure (e.g.  stress test, cath, DCCV, TEE, etc)?   Press F2        :[id number]}   This note was written with the assistance of a dictation microphone or AI dictation software. Please excuse any typos or grammatical errors.   Signed, Georganna Archer, MD 02/17/2024 11:23 AM    [facility]

## 2024-02-18 ENCOUNTER — Ambulatory Visit
Attending: Student in an Organized Health Care Education/Training Program | Admitting: Student in an Organized Health Care Education/Training Program

## 2024-02-19 NOTE — Telephone Encounter (Signed)
 Received fax from [facility] with both the pt's original diagnostic sleep study and the titration study. A copy was made of each. The originals were placed in the scan folder in B Pod, and the copy was faxed to [facility] at [phone].   Awaiting fax confirmation.

## 2024-02-19 NOTE — Telephone Encounter (Signed)
 Fax confirmation received.  The successful fax along with the copies of both sleep studies was placed in Tammy Parrett's cabinet in B Pod. NFN.

## 2024-03-02 NOTE — Telephone Encounter (Signed)
 Received new request from [facility] for pt's sleep study.  Refaxed sleep study to [facility] at [phone]. Fax confirmation received, NFN.

## 2024-03-02 NOTE — Telephone Encounter (Signed)
 Fax along with sleep study has been placed in Dr. Lavena black file folder in his cabinet in A pod. The original sleep study has not yet been scanned in. NFN.

## 2024-03-16 ENCOUNTER — Telehealth: Admitting: Physician Assistant

## 2024-03-16 NOTE — Progress Notes (Signed)
The patient no-showed for appointment despite this provider sending direct link, with no response and waiting for at least 10 minutes from appointment time for patient to join. They will be marked as a NS for this appointment/time.   Leeanne Rio, PA-C

## 2024-05-05 ENCOUNTER — Ambulatory Visit

## 2024-05-05 ENCOUNTER — Encounter
